# Patient Record
Sex: Female | Born: 1960
Health system: Southern US, Community
[De-identification: ages and names within clinical notes are randomized; demographics above are authoritative.]

## PROBLEM LIST (undated history)

## (undated) DIAGNOSIS — F319 Bipolar disorder, unspecified: Secondary | ICD-10-CM

## (undated) DIAGNOSIS — I10 Essential (primary) hypertension: Secondary | ICD-10-CM

## (undated) DIAGNOSIS — J45909 Unspecified asthma, uncomplicated: Secondary | ICD-10-CM

## (undated) DIAGNOSIS — N200 Calculus of kidney: Secondary | ICD-10-CM

## (undated) HISTORY — PX: CHOLECYSTECTOMY: SHX55

## (undated) HISTORY — PX: APPENDECTOMY: SHX54

## (undated) HISTORY — PX: ABDOMINAL HYSTERECTOMY: SHX81

---

## 1999-04-29 ENCOUNTER — Emergency Department (HOSPITAL_COMMUNITY): Admission: EM | Admit: 1999-04-29 | Discharge: 1999-04-29 | Payer: Self-pay | Admitting: Emergency Medicine

## 1999-04-29 ENCOUNTER — Encounter: Payer: Self-pay | Admitting: Emergency Medicine

## 1999-05-26 ENCOUNTER — Emergency Department (HOSPITAL_COMMUNITY): Admission: EM | Admit: 1999-05-26 | Discharge: 1999-05-26 | Payer: Self-pay | Admitting: *Deleted

## 1999-07-19 ENCOUNTER — Emergency Department (HOSPITAL_COMMUNITY): Admission: EM | Admit: 1999-07-19 | Discharge: 1999-07-19 | Payer: Self-pay | Admitting: Emergency Medicine

## 1999-07-19 ENCOUNTER — Encounter: Payer: Self-pay | Admitting: Emergency Medicine

## 1999-09-26 ENCOUNTER — Emergency Department (HOSPITAL_COMMUNITY): Admission: EM | Admit: 1999-09-26 | Discharge: 1999-09-26 | Payer: Self-pay | Admitting: Emergency Medicine

## 1999-11-16 ENCOUNTER — Encounter: Payer: Self-pay | Admitting: Emergency Medicine

## 1999-11-16 ENCOUNTER — Emergency Department (HOSPITAL_COMMUNITY): Admission: EM | Admit: 1999-11-16 | Discharge: 1999-11-16 | Payer: Self-pay | Admitting: Emergency Medicine

## 2000-03-08 ENCOUNTER — Emergency Department (HOSPITAL_COMMUNITY): Admission: EM | Admit: 2000-03-08 | Discharge: 2000-03-08 | Payer: Self-pay | Admitting: Emergency Medicine

## 2000-03-14 ENCOUNTER — Encounter: Admission: RE | Admit: 2000-03-14 | Discharge: 2000-03-14 | Payer: Self-pay | Admitting: Internal Medicine

## 2000-03-14 ENCOUNTER — Ambulatory Visit (HOSPITAL_COMMUNITY): Admission: RE | Admit: 2000-03-14 | Discharge: 2000-03-14 | Payer: Self-pay | Admitting: Internal Medicine

## 2000-03-14 ENCOUNTER — Encounter: Payer: Self-pay | Admitting: Internal Medicine

## 2000-04-04 ENCOUNTER — Encounter: Admission: RE | Admit: 2000-04-04 | Discharge: 2000-04-04 | Payer: Self-pay | Admitting: Internal Medicine

## 2000-05-17 ENCOUNTER — Encounter: Payer: Self-pay | Admitting: Emergency Medicine

## 2000-05-17 ENCOUNTER — Emergency Department (HOSPITAL_COMMUNITY): Admission: EM | Admit: 2000-05-17 | Discharge: 2000-05-17 | Payer: Self-pay | Admitting: Emergency Medicine

## 2000-07-22 ENCOUNTER — Emergency Department (HOSPITAL_COMMUNITY): Admission: EM | Admit: 2000-07-22 | Discharge: 2000-07-22 | Payer: Self-pay | Admitting: Emergency Medicine

## 2000-07-23 ENCOUNTER — Encounter: Payer: Self-pay | Admitting: Emergency Medicine

## 2000-10-17 ENCOUNTER — Emergency Department (HOSPITAL_COMMUNITY): Admission: EM | Admit: 2000-10-17 | Discharge: 2000-10-17 | Payer: Self-pay | Admitting: Emergency Medicine

## 2001-03-26 ENCOUNTER — Emergency Department (HOSPITAL_COMMUNITY): Admission: EM | Admit: 2001-03-26 | Discharge: 2001-03-26 | Payer: Self-pay | Admitting: Emergency Medicine

## 2001-03-28 ENCOUNTER — Emergency Department (HOSPITAL_COMMUNITY): Admission: EM | Admit: 2001-03-28 | Discharge: 2001-03-28 | Payer: Self-pay | Admitting: Emergency Medicine

## 2001-08-16 ENCOUNTER — Encounter: Payer: Self-pay | Admitting: Emergency Medicine

## 2001-08-16 ENCOUNTER — Emergency Department (HOSPITAL_COMMUNITY): Admission: EM | Admit: 2001-08-16 | Discharge: 2001-08-16 | Payer: Self-pay | Admitting: Emergency Medicine

## 2001-09-07 ENCOUNTER — Emergency Department (HOSPITAL_COMMUNITY): Admission: EM | Admit: 2001-09-07 | Discharge: 2001-09-07 | Payer: Self-pay | Admitting: Emergency Medicine

## 2001-10-16 ENCOUNTER — Emergency Department (HOSPITAL_COMMUNITY): Admission: EM | Admit: 2001-10-16 | Discharge: 2001-10-16 | Payer: Self-pay

## 2001-11-05 ENCOUNTER — Emergency Department (HOSPITAL_COMMUNITY): Admission: EM | Admit: 2001-11-05 | Discharge: 2001-11-05 | Payer: Self-pay | Admitting: Emergency Medicine

## 2001-11-06 ENCOUNTER — Emergency Department (HOSPITAL_COMMUNITY): Admission: EM | Admit: 2001-11-06 | Discharge: 2001-11-06 | Payer: Self-pay | Admitting: Emergency Medicine

## 2001-11-07 ENCOUNTER — Ambulatory Visit (HOSPITAL_COMMUNITY): Admission: RE | Admit: 2001-11-07 | Discharge: 2001-11-07 | Payer: Self-pay | Admitting: *Deleted

## 2001-11-16 ENCOUNTER — Encounter: Admission: RE | Admit: 2001-11-16 | Discharge: 2001-11-16 | Payer: Self-pay | Admitting: Obstetrics and Gynecology

## 2001-11-30 ENCOUNTER — Encounter: Admission: RE | Admit: 2001-11-30 | Discharge: 2001-11-30 | Payer: Self-pay | Admitting: *Deleted

## 2001-12-21 ENCOUNTER — Encounter: Admission: RE | Admit: 2001-12-21 | Discharge: 2001-12-21 | Payer: Self-pay | Admitting: Obstetrics and Gynecology

## 2001-12-26 ENCOUNTER — Inpatient Hospital Stay (HOSPITAL_COMMUNITY): Admission: RE | Admit: 2001-12-26 | Discharge: 2001-12-29 | Payer: Self-pay | Admitting: Obstetrics and Gynecology

## 2001-12-26 ENCOUNTER — Encounter (INDEPENDENT_AMBULATORY_CARE_PROVIDER_SITE_OTHER): Payer: Self-pay | Admitting: Specialist

## 2002-01-05 ENCOUNTER — Encounter: Payer: Self-pay | Admitting: Obstetrics and Gynecology

## 2002-01-05 ENCOUNTER — Inpatient Hospital Stay (HOSPITAL_COMMUNITY): Admission: AD | Admit: 2002-01-05 | Discharge: 2002-01-05 | Payer: Self-pay | Admitting: Obstetrics and Gynecology

## 2002-01-18 ENCOUNTER — Encounter: Admission: RE | Admit: 2002-01-18 | Discharge: 2002-01-18 | Payer: Self-pay | Admitting: Obstetrics and Gynecology

## 2002-01-21 ENCOUNTER — Emergency Department (HOSPITAL_COMMUNITY): Admission: EM | Admit: 2002-01-21 | Discharge: 2002-01-21 | Payer: Self-pay | Admitting: Emergency Medicine

## 2002-02-20 ENCOUNTER — Encounter: Admission: RE | Admit: 2002-02-20 | Discharge: 2002-02-20 | Payer: Self-pay | Admitting: *Deleted

## 2002-03-01 ENCOUNTER — Encounter: Admission: RE | Admit: 2002-03-01 | Discharge: 2002-03-01 | Payer: Self-pay | Admitting: Internal Medicine

## 2002-06-29 ENCOUNTER — Encounter: Payer: Self-pay | Admitting: Emergency Medicine

## 2002-06-29 ENCOUNTER — Emergency Department (HOSPITAL_COMMUNITY): Admission: EM | Admit: 2002-06-29 | Discharge: 2002-06-29 | Payer: Self-pay | Admitting: Emergency Medicine

## 2002-12-27 ENCOUNTER — Encounter: Admission: RE | Admit: 2002-12-27 | Discharge: 2002-12-27 | Payer: Self-pay | Admitting: Obstetrics and Gynecology

## 2003-01-01 ENCOUNTER — Encounter: Payer: Self-pay | Admitting: Obstetrics and Gynecology

## 2003-01-01 ENCOUNTER — Ambulatory Visit (HOSPITAL_COMMUNITY): Admission: RE | Admit: 2003-01-01 | Discharge: 2003-01-01 | Payer: Self-pay | Admitting: Obstetrics and Gynecology

## 2003-01-07 ENCOUNTER — Emergency Department (HOSPITAL_COMMUNITY): Admission: EM | Admit: 2003-01-07 | Discharge: 2003-01-07 | Payer: Self-pay | Admitting: *Deleted

## 2003-02-21 ENCOUNTER — Encounter: Payer: Self-pay | Admitting: Emergency Medicine

## 2003-02-21 ENCOUNTER — Emergency Department (HOSPITAL_COMMUNITY): Admission: EM | Admit: 2003-02-21 | Discharge: 2003-02-21 | Payer: Self-pay | Admitting: Emergency Medicine

## 2003-07-08 ENCOUNTER — Inpatient Hospital Stay (HOSPITAL_COMMUNITY): Admission: AD | Admit: 2003-07-08 | Discharge: 2003-07-08 | Payer: Self-pay | Admitting: Obstetrics & Gynecology

## 2003-07-11 ENCOUNTER — Emergency Department (HOSPITAL_COMMUNITY): Admission: EM | Admit: 2003-07-11 | Discharge: 2003-07-11 | Payer: Self-pay | Admitting: Emergency Medicine

## 2003-07-18 ENCOUNTER — Encounter: Admission: RE | Admit: 2003-07-18 | Discharge: 2003-07-18 | Payer: Self-pay | Admitting: Obstetrics and Gynecology

## 2003-07-29 ENCOUNTER — Ambulatory Visit (HOSPITAL_COMMUNITY): Admission: RE | Admit: 2003-07-29 | Discharge: 2003-07-29 | Payer: Self-pay | Admitting: Obstetrics and Gynecology

## 2003-10-18 ENCOUNTER — Emergency Department (HOSPITAL_COMMUNITY): Admission: EM | Admit: 2003-10-18 | Discharge: 2003-10-18 | Payer: Self-pay | Admitting: Emergency Medicine

## 2004-01-18 ENCOUNTER — Emergency Department (HOSPITAL_COMMUNITY): Admission: EM | Admit: 2004-01-18 | Discharge: 2004-01-18 | Payer: Self-pay | Admitting: Emergency Medicine

## 2004-06-17 ENCOUNTER — Emergency Department (HOSPITAL_COMMUNITY): Admission: EM | Admit: 2004-06-17 | Discharge: 2004-06-17 | Payer: Self-pay | Admitting: Emergency Medicine

## 2004-06-25 ENCOUNTER — Emergency Department (HOSPITAL_COMMUNITY): Admission: EM | Admit: 2004-06-25 | Discharge: 2004-06-25 | Payer: Self-pay | Admitting: Emergency Medicine

## 2004-06-27 ENCOUNTER — Emergency Department (HOSPITAL_COMMUNITY): Admission: EM | Admit: 2004-06-27 | Discharge: 2004-06-27 | Payer: Self-pay | Admitting: Emergency Medicine

## 2005-02-27 ENCOUNTER — Emergency Department (HOSPITAL_COMMUNITY): Admission: EM | Admit: 2005-02-27 | Discharge: 2005-02-27 | Payer: Self-pay | Admitting: Emergency Medicine

## 2005-03-09 ENCOUNTER — Emergency Department (HOSPITAL_COMMUNITY): Admission: EM | Admit: 2005-03-09 | Discharge: 2005-03-09 | Payer: Self-pay | Admitting: Emergency Medicine

## 2005-09-20 ENCOUNTER — Emergency Department (HOSPITAL_COMMUNITY): Admission: EM | Admit: 2005-09-20 | Discharge: 2005-09-20 | Payer: Self-pay | Admitting: Emergency Medicine

## 2006-01-03 ENCOUNTER — Emergency Department (HOSPITAL_COMMUNITY): Admission: EM | Admit: 2006-01-03 | Discharge: 2006-01-03 | Payer: Self-pay | Admitting: *Deleted

## 2006-06-17 ENCOUNTER — Emergency Department (HOSPITAL_COMMUNITY): Admission: EM | Admit: 2006-06-17 | Discharge: 2006-06-17 | Payer: Self-pay | Admitting: Emergency Medicine

## 2006-07-19 ENCOUNTER — Emergency Department (HOSPITAL_COMMUNITY): Admission: EM | Admit: 2006-07-19 | Discharge: 2006-07-19 | Payer: Self-pay | Admitting: Family Medicine

## 2007-07-02 ENCOUNTER — Emergency Department (HOSPITAL_COMMUNITY): Admission: EM | Admit: 2007-07-02 | Discharge: 2007-07-02 | Payer: Self-pay | Admitting: Emergency Medicine

## 2007-07-11 ENCOUNTER — Emergency Department (HOSPITAL_COMMUNITY): Admission: EM | Admit: 2007-07-11 | Discharge: 2007-07-11 | Payer: Self-pay | Admitting: Emergency Medicine

## 2007-07-26 IMAGING — CR DG CHEST 2V
2 series · 2 of 2 positions shown · non-contrast
Comparison: Reported 06/29/2002.

CLINICAL DATA: Cough, shortness of breath and chest pain.

CHEST - 2 VIEW

[w chest pa]
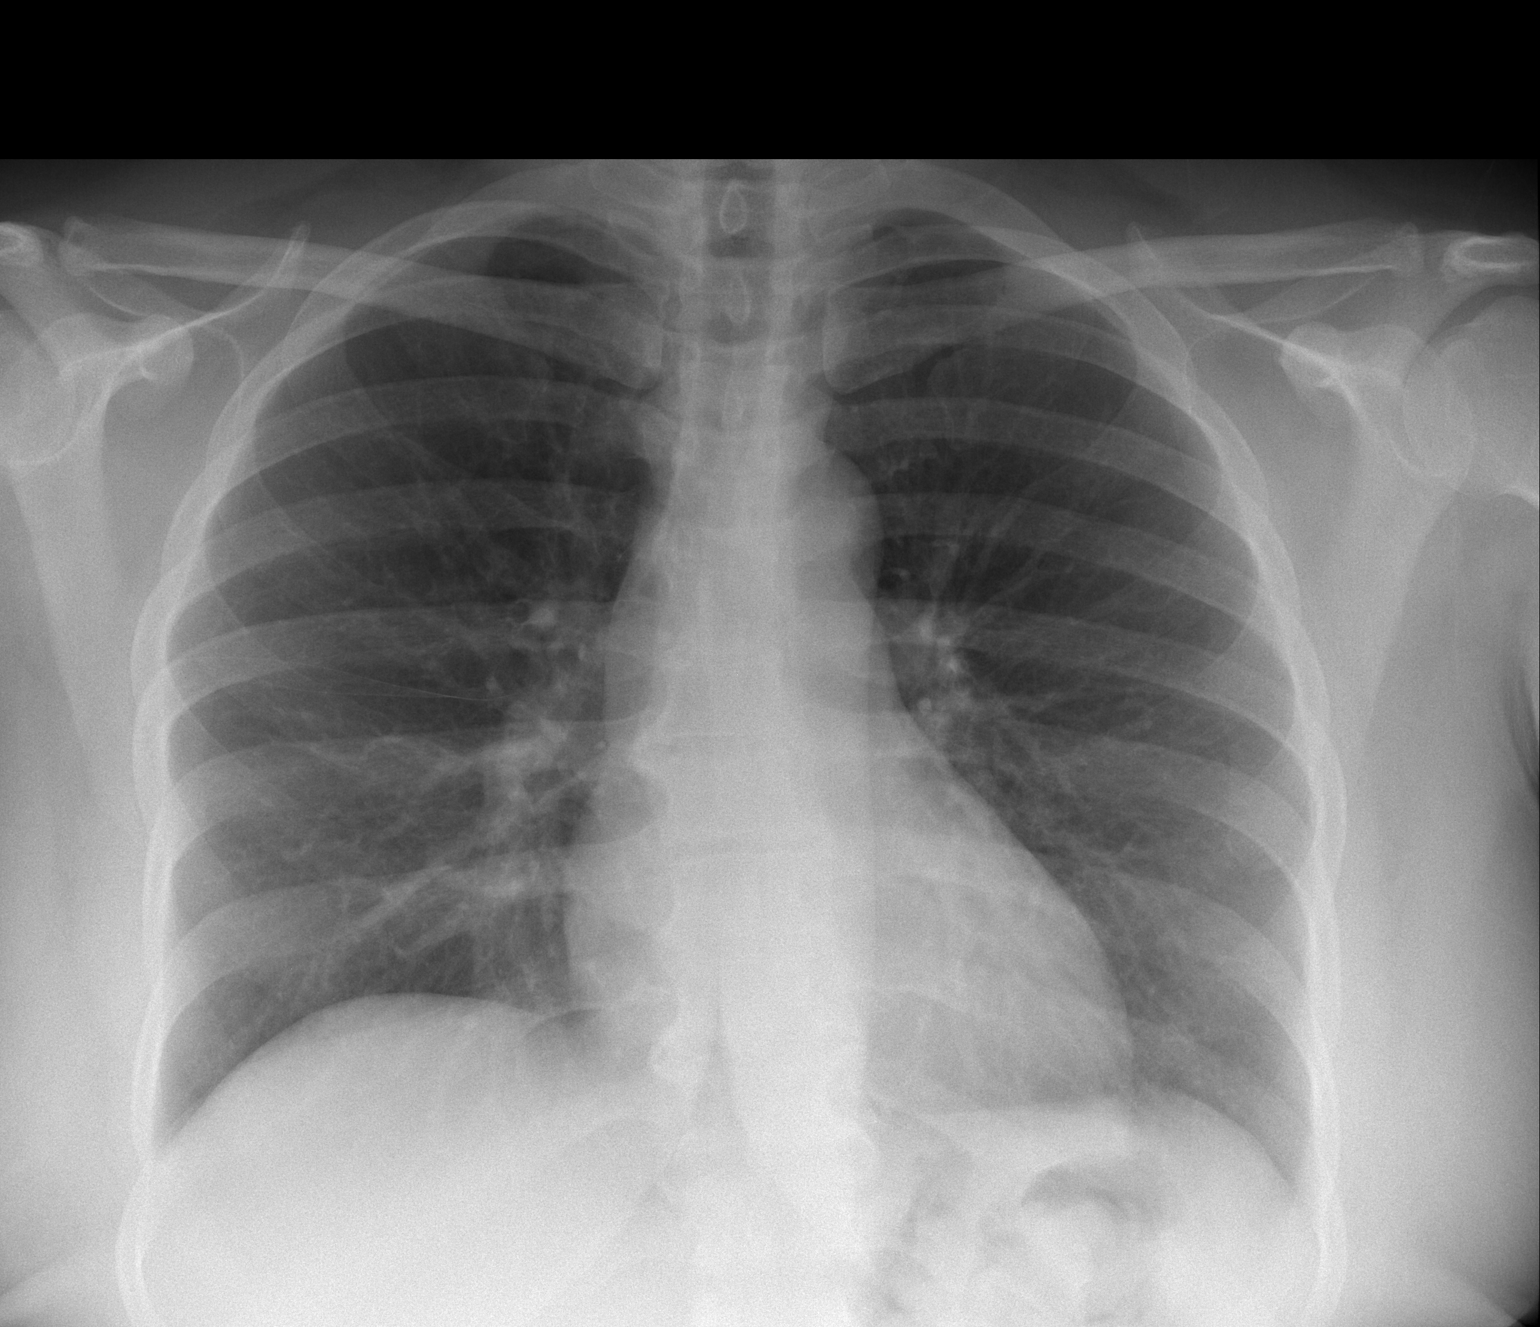

[w chest lat]
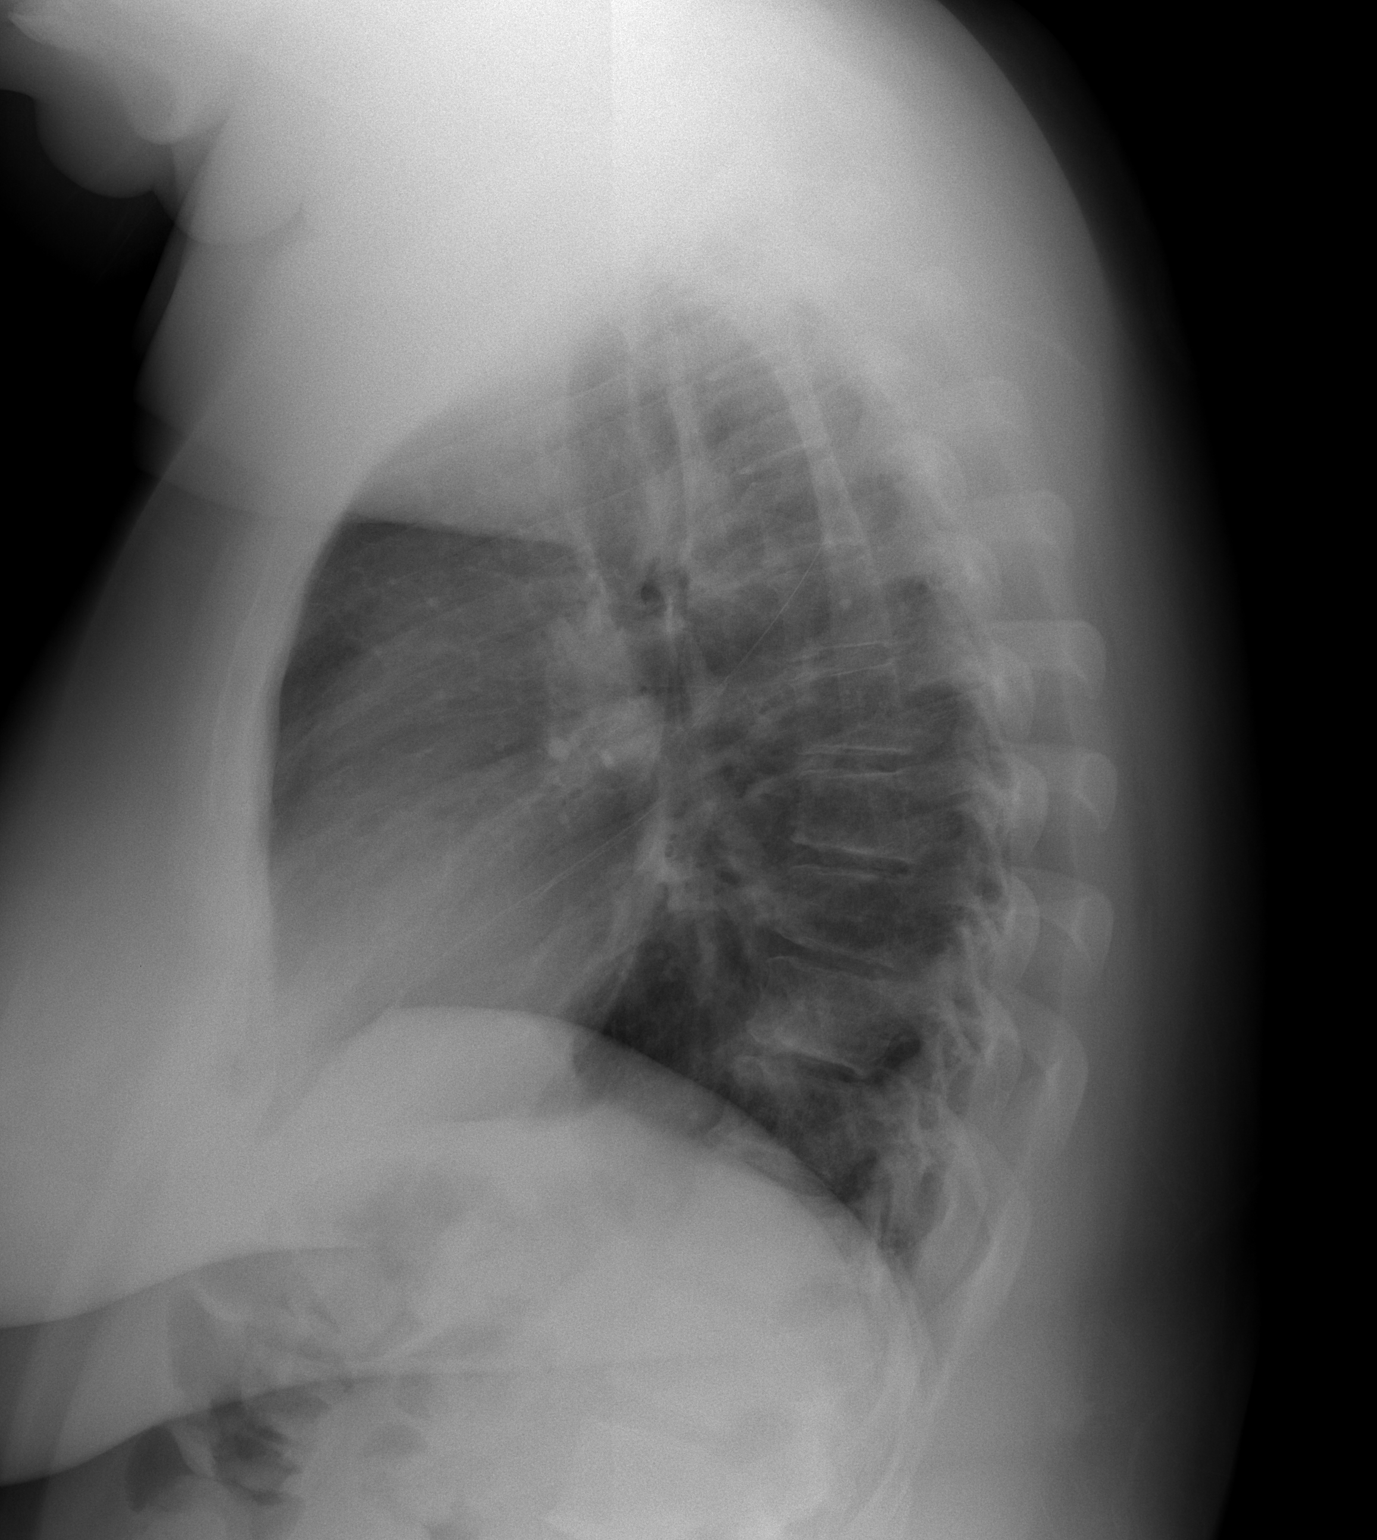

[2 of 2 positions shown; findings below may reference images not displayed]

FINDINGS: Normal sized heart. Diffuse peribronchial thickening. Thoracic spine
degenerative changes and mild scoliosis.

IMPRESSION

Mild to moderate bronchitic changes.

## 2008-05-09 ENCOUNTER — Emergency Department (HOSPITAL_COMMUNITY): Admission: EM | Admit: 2008-05-09 | Discharge: 2008-05-09 | Payer: Self-pay | Admitting: Emergency Medicine

## 2008-05-20 ENCOUNTER — Emergency Department (HOSPITAL_COMMUNITY): Admission: EM | Admit: 2008-05-20 | Discharge: 2008-05-20 | Payer: Self-pay | Admitting: Emergency Medicine

## 2008-05-27 ENCOUNTER — Emergency Department (HOSPITAL_COMMUNITY): Admission: EM | Admit: 2008-05-27 | Discharge: 2008-05-27 | Payer: Self-pay | Admitting: Emergency Medicine

## 2008-06-10 ENCOUNTER — Emergency Department (HOSPITAL_COMMUNITY): Admission: EM | Admit: 2008-06-10 | Discharge: 2008-06-10 | Payer: Self-pay | Admitting: Emergency Medicine

## 2008-09-23 ENCOUNTER — Emergency Department (HOSPITAL_COMMUNITY): Admission: EM | Admit: 2008-09-23 | Discharge: 2008-09-23 | Payer: Self-pay | Admitting: Emergency Medicine

## 2010-09-21 LAB — DIFFERENTIAL
Basophils Absolute: 0 10*3/uL (ref 0.0–0.1)
Basophils Relative: 0 % (ref 0–1)
Eosinophils Absolute: 0.2 10*3/uL (ref 0.0–0.7)
Eosinophils Relative: 2 % (ref 0–5)
Lymphocytes Relative: 34 % (ref 12–46)
Lymphs Abs: 3.1 10*3/uL (ref 0.7–4.0)
Monocytes Absolute: 0.6 10*3/uL (ref 0.1–1.0)
Monocytes Relative: 6 % (ref 3–12)
Neutro Abs: 5.2 10*3/uL (ref 1.7–7.7)
Neutrophils Relative %: 57 % (ref 43–77)

## 2010-09-21 LAB — POCT I-STAT, CHEM 8
BUN: 8 mg/dL (ref 6–23)
Calcium, Ion: 1.14 mmol/L (ref 1.12–1.32)
Chloride: 101 mEq/L (ref 96–112)
Creatinine, Ser: 0.8 mg/dL (ref 0.4–1.2)
Glucose, Bld: 115 mg/dL — ABNORMAL HIGH (ref 70–99)
HCT: 42 % (ref 36.0–46.0)
Hemoglobin: 14.3 g/dL (ref 12.0–15.0)
Potassium: 3.1 mEq/L — ABNORMAL LOW (ref 3.5–5.1)
Sodium: 140 mEq/L (ref 135–145)
TCO2: 28 mmol/L (ref 0–100)

## 2010-09-21 LAB — D-DIMER, QUANTITATIVE: D-Dimer, Quant: 0.31 ug/mL-FEU (ref 0.00–0.48)

## 2010-09-21 LAB — CBC
HCT: 38.4 % (ref 36.0–46.0)
Hemoglobin: 12.7 g/dL (ref 12.0–15.0)
MCHC: 33 g/dL (ref 30.0–36.0)
MCV: 80.4 fL (ref 78.0–100.0)
Platelets: 348 10*3/uL (ref 150–400)
RBC: 4.77 MIL/uL (ref 3.87–5.11)
RDW: 13.5 % (ref 11.5–15.5)
WBC: 9.1 10*3/uL (ref 4.0–10.5)

## 2010-09-21 LAB — POCT CARDIAC MARKERS
CKMB, poc: <1 ng/mL — ABNORMAL LOW (ref 1.0–8.0)
Myoglobin, poc: 66.5 ng/mL (ref 12–200)
Troponin i, poc: <0.05 ng/mL (ref 0.00–0.09)

## 2010-10-23 NOTE — Consult Note (Signed)
[place]. [facility]  Patient:    Emily Cochran, Emily Cochran Visit Number: [id number] MRN: [id number]          Service Type: EMS Location: ED Attending Physician:  Benny Lennert Dictated by:   Kristen Loader, M.D. Proc. Date: 11/30/01 Admit Date:  11/06/2001 Discharge Date: 11/06/2001                            Consultation Report  HISTORY OF PRESENT ILLNESS:  The patient is a 50 year old multiparous African-American female who has had several cesarean sections and has pelvic pain, she states since tubal ligation several years ago.  The pain is in the mid lower abdominal area and it radiates to the lower back and down into her leg.  She was recently treated for Trichomonas and bacterial vaginosis with Flagyl and Metrojel.  Accompanying her pain is extremely heavy and irregular periods where she bleeds for extended times for two weeks at a time.  An ultrasound examination revealed a fibroid uterus where the endometrial stripe could not be identified.  The largest fibroids were 9 cm in diameter and 5.9 cm in diameter.  We discussed surgery with the patient.  She prefers myomectomy. We discussed that sometimes this was impossible and if we did do it, there might be a very good chance she may need multiple blood transfusions.  I also discussed total abdominal hysterectomy.  The patient understands that by doing a myomectomy, this might not relieve the majority of her pain and may not effect a lot of the bleeding.  Since the fibroids appear to be in the wall of the uterus and even if we take out the uterus, we certainly could not guarantee that this would relieve all of the pain, but it certainly would relieve the bleeding.  The patient is going to consider this and discuss it with her family.  Her friend with her was present during this discussion.  The patient says that the pain is severe enough that she is unable to stand on her feet or hold a job.  The patient at  the time of this examination was bleeding, so we did a repeat culture, but wet prep was not possible.  The diagnosis at this time is symptomatic leiomyomata uteri.  The patient is to consider the procedures and schedule either myomectomy with possible hysterectomy or total abdominal hysterectomy. Dictated by:   Kristen Loader, M.D. Attending Physician:  Benny Lennert DD:  11/30/01 TD:  12/02/01 Job: [id number]

## 2010-10-23 NOTE — Consult Note (Signed)
[place]. [facility]  Patient:    Emily Cochran, Emily Cochran Visit Number: [id number] MRN: [id number]          Service Type: EMS Location: ED Attending Physician:  Benny Lennert Dictated by:   Kristen Loader, M.D. Proc. Date: 11/16/01 Admit Date:  11/06/2001 Discharge Date: 11/06/2001                            Consultation Report  CHIEF COMPLAINT:  Pelvic pain radiating to right leg.  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  She was seen in a clinic where they did a sonogram that showed very large fibroid tumors, the largest on the right measuring 9 cm in nature and dimension.  The patient also was treated at that time with Flagyl and Doxycycline for pelvic infection and vaginitis.  She comes to the clinic today for the first time where her symptoms are persisting.  At this point, she has severe genital itching.  PHYSICAL EXAMINATION:  ABDOMEN:  Soft, flat, tender suprapubically, especially on the right side with no guarding and no rebound.  There was a large lower abdominal scar because of three cesarean sections and a tubal ligation that she had in the past.  GENITAL:  Revealed large, swollen, edematous white-coated labia majoris and minoris.  Tender to touch with a heavy cottage chesse vaginal discharge extending through the entire vagina through the cervix.  By manual pelvic exam, we were able to blot a large mass in the lower abdomen extending on the right side just a fingerbreadth below the umbilicus.  LABORATORY DATA:  A wet prep was done which revealed 4+ yeast.  IMPRESSION: 1. Symptomatic fibroids. 2. Severe yeast. 3. Vulvovaginitis. 4. Blood loss anemia as the patient has been complaining of menorrhagia    for  many years.  DISPOSITION:  The patient was placed on Diflucan and Mycolog too.  She will be scheduled for a hysterectomy.  We discussed the procedure with the patient.  I want her to return to the clinic in two weeks for reevaluation.  Incidentally, during exam, we noticed a large fibroid which palpated very large on both sides with no nodules noted.  Laboratories obtained was a CBC, T4, T3, and a TSH. Dictated by:   Kristen Loader, M.D. Attending Physician:  Benny Lennert DD:  11/16/01 TD:  11/18/01 Job: [id number]

## 2010-10-23 NOTE — Discharge Summary (Signed)
   NAME:  Emily Cochran, Emily Cochran                        ACCOUNT NO.:  [id number]   MEDICAL RECORD NO.:  [id number]                   PATIENT TYPE:  INP   LOCATION:  9308                                 [facility]ILITY:  [facility]   PHYSICIAN:  Phil D. Okey Dupre, M.D.                  DATE OF BIRTH:  November 21, 1960   DATE OF ADMISSION:  12/26/2001  DATE OF DISCHARGE:  12/29/2001                                 DISCHARGE SUMMARY   The patient is a 50 year old black female who on the day of admission  underwent supracervical abdominal hysterectomy and partial left  salpingectomy with preoperatively and postoperative diagnoses of pelvic  pain, chronic fibroids, menorrhagia, and anemia with pelvic adhesions.  The  patient did extremely well and had an afebrile course postoperatively.  Again, staples were removed prior to discharge.  Discharge instructions have  been given to the patient as to activity, diet, and follow-up.  She will be  seen in the clinic two weeks from discharge.  The patient has been told to  start on iron once regular bowel movements have been reestablished.  We will  follow that up in her visit with the hopes that her hemoglobin will be back  to normal.  The patient's preoperatively hemoglobin and hematocrit were 8.6  and 28.6, respectively, and at discharge were 7.0 and 22.4, respectively.  Other labs were within normal limits.   DISCHARGE DIAGNOSIS:  Status post subtotal abdominal hysterectomy for  menorrhagia, symptomatic fibroids, and severe anemia.                                               Phil D. Okey Dupre, M.D.    PDR/MEDQ  D:  12/29/2001  T:  01/07/2002  Job:  [id number]

## 2010-10-23 NOTE — Consult Note (Signed)
NAME:  Emily Cochran, Emily Cochran                        ACCOUNT NO.:  [id number]   MEDICAL RECORD NO.:  [id number]                   PATIENT TYPE:  EMS   LOCATION:  ED                                   FACILITY:  [facility]   PHYSICIAN:  Sandria Bales. Ezzard Standing, M.D.               DATE OF BIRTH:  December 21, 1960   DATE OF CONSULTATION:  02/21/2003  DATE OF DISCHARGE:                                   CONSULTATION   HISTORY OF PRESENT ILLNESS:  Ms. Sitts is a 50 year old black female who  has no primary medical doctor, had a partial hysterectomy by Javier Glazier. Rose,  M.D. for fibroid tumors of her uterus as her only identifying medical  doctor.  She, about six or eight weeks ago, presented to [facility]  with abdominal pain and ultrasound revealed a gallstone or gallstones.  She  saw somebody in the [place] office though she is not really sure who or what  department she was seen in.  Now she has had about a three-week history of  vague abdominal pain and re-presented to the [facility] Emergency Room last  night.  It is unclear to me why she waited three weeks to come to the  emergency room.  I could not see that she had any exacerbation of her pain.  She has not had any fever.   She has a remote history of ulcer disease about 1990.  She has no recent  problem with ulcer problems.  She has no history of liver disease,  pancreatic disease, colon disease, or change in bowel habits.   Her prior abdominal surgery has included she had three children who are 72,  25, and 22 all delivered by C-section.  She had the hysterectomy about six  to eight months ago and then she has had an appendectomy some 15 or 20 years  ago.  She also describes that she has had a vaginal discharge.  She has on  and off.  It is odorous at this time.   PAST MEDICAL HISTORY:  She has no allergies.  She is on no medications.   REVIEW OF SYSTEMS:  NEUROLOGICAL:  No known seizure or loss of  consciousness.  PULMONARY:  No history of  pneumonia or tuberculosis.  CARDIAC:  No history of heart disease, chest pain, or hypertension.  GASTROINTESTINAL:  See History of Present Illness.  UROLOGIC:  No history of  kidney stones or kidney infections.  GYN:  Again, she has had three children  delivered by C-section.  Again, she had the hysterectomy, had a recent  vaginal discharge.   She works as a Engineer, drilling.  Again, lives with her son.   PHYSICAL EXAMINATION:  VITAL SIGNS:  Temperature 98.1, pulse 59,  respirations 18, blood pressure 103/67.  GENERAL:  She is a well-nourished, moderately obese black female alert and  cooperative on physical exam.  HEENT:  Unremarkable.  NECK:  Supple.  I felt no mass or thyromegaly, no lymphadenopathy.  LUNGS:  Clear to auscultation with symmetric breath sounds.  HEART:  Regular rate and rhythm without murmur or rub.  BREASTS:  I did not examine her breasts.  She has not had a mammogram and I  told her that at age 7 she probably should start having mammograms at least  on an every two year basis.  ABDOMEN:  She actually has had some soreness in the right lower quadrant.  I  do not feel any right upper quadrant mass or lesion.  She has a well-healed  lower midline incision from her prior C-section.  She also has an  Pfannenstiel incision.  PELVIC:  I did not do a pelvic exam because this was done by the ER  physicians.  I have spoke with Dr. Donnetta Hutching regarding this and regarding  her vaginal discharge.  EXTREMITIES:  She has good strength in upper and lower extremities.  NEUROLOGICAL:  She is grossly intact.   LABORATORY DATA:  Labs that I have show a normal urinalysis.  Her white  blood count is 10,100, hemoglobin 11.1, hematocrit 33.5.  Her potassium is  3.5, sodium 137, chloride 105.  Her creatinine is 0.7.  Liver function is  within normal limits with an alkaline phosphatase of 77, bilirubin of 0.4.  Her amylase is 109.  Her lipase is 40.   IMPRESSION:  1. Right lower  quadrant pain with vaginal discharge, question of whether     this actually is more of a gynecologic female acute problem.  I again     discussed this with Dr. Donnetta Hutching who will review either her pelvic exam     and labs, taken from that and then treat her accordingly.  2. I did review her ultrasound.  She does have a solitary gallstone in the     neck of her gallbladder though she has no stigmata of acute gallbladder     disease either on ultrasound or physical exam or by labs.  I gave her my     name.  She is to come by our office to arrange and schedule an elective     cholecystectomy.  She is to take a book on cholecystectomy.  I discussed     a little bit about the operation including laparoscopic and open surgery     and the risk of bleeding, infection of open surgery.  3. She needs a primary medical doctor.  I went over this with her that she     is at the age where she really needs routine evaluations such as     mammograms, etc., for evaluation.                                               Sandria Bales. Ezzard Standing, M.D.    DHN/MEDQ  D:  02/21/2003  T:  02/21/2003  Job:  [id number]   cc:   Michele Mcalpine D. Okey Dupre, M.D.  Fax: [phone]

## 2010-10-23 NOTE — Op Note (Signed)
[facility]  Patient:    Emily Cochran, Emily Cochran Visit Number: [id number] MRN: [id number]          Service Type: GYN Location: 9300 [id number] Attending Physician:  Amada Kingfisher. Dictated by:   Elinor Dodge, M.D. Proc. Date: 12/26/01 Admit Date:  12/26/2001 Discharge Date: 12/29/2001                             Operative Report  PREOPERATIVE DIAGNOSES:       1. Chronic pelvic pain.                               2. Fibroids.                               3. Menorrhagia.                               4. Anemia.  POSTOPERATIVE DIAGNOSES:      1. Chronic pelvic pain.                               2. Fibroids.                               3. Menorrhagia.                               4. Anemia.  OPERATIONS:                   1. Supracervical abdominal hysterectomy.                               2. Partial left salpingectomy.  SURGEON:                      Elinor Dodge, M.D.  FIRST ASSISTANT:              Georgina Peer, M.D.  OPERATIVE FINDINGS:           Multiple leiomyomata uteri with a uterus of about [redacted] weeks gestational size, a large submucous leiomyomata measuring 5 cm in diameter in the left broad ligament, and a large fundal fibroid measuring approximately 7 cm in diameter. There were multiple pelvic adhesions, residual of previous tubal ligation, and a bladder flap that was high up on the anterior surface of the uterus where the fibroid began. Upper abdominal viscera was normal.  DESCRIPTION OF PROCEDURE:     Under satisfactory general anesthesia, with a Foley catheter in the bladder and the vagina having been prepped, the abdomen was prepped and draped in the usual sterile manner and entered through a transverse incision removing an old surgical scar on entry because of the abdominal deformity. The fascia was opened by sharp dissection. The rectus muscles were then transected by sharp dissection and the peritoneal cavity was entered. The  bowel was well packed away. A Balfour retractor placed in the pelvis and the findings were as aforementioned. Straight Kocher clamps were placed, mapped parallel to the uterus and just adjacent  to it to include the round ligament, the utero-ovarian ligament, and the meso beneath that. The round ligaments were then ligated, divided, and the anterior leaf of the broad ligament opened as much as possible, especially on the right side. The left side was inhibited because of the large fibroid and the broad ligament. This was run around the anterior superior portion of the cervix. An avascular portion of the broad ligament was then opened and a free-tie placed around that and around the utero-ovarian pedicle. A straight Kocher clamp placed medial to that and pedicle on both sides and its tissue medially divided. Lateral pedicle ligated with 1 chromic catgut suture ligature. The bladder was pushed away from the cervix on the right side. It was still not freed up on the left and the uterine vessels were skeletonized, doubly clamped, divided, and ligated with 1 chromic catgut suture ligature and the cardinal ligament was grasped on that side, divided, and ligated with 1 chromic catgut suture ligature. This gave some freedom to pull the uterus over to the right side and allowed [place] to dissect out most of the fibroid from the broad ligament. Once we got below that, we were able to free up the peritoneum and the front of the uterus and push the bladder away from the lower uterine segment, which allowed [place] to skeletonize the uterine vessels on that side, clamp it, divide it, and ligate it with 1 chromic catgut suture ligature and then dissect the corpus of the uterus away from the cervix by sharp dissection with very little bleeding. This then opened up the exposure so that we could continue down with a couple of bites on the cardinal ligament, which was divided and ligated and that portion of the cervix  above that was excised thus leaving small portion of cervix down below which would have been difficult to get in this patient with three previous sections and no vaginal deliveries. The cervix was then oversewn to control oozing with figure-of-eight sutures and no bleeding was noted in the pelvis. Attention directed to the adnexa on the right. It was stable, there was no sign of bleeding. The left, however, had a necrotic-appearing portion of a fallopian tube which was excised and the pedicle ligated with 1 chromic catgut suture ligature. The remaining pedicle sewn to the round ligament on that side with one figure-of-eight suture so that it would not fall into the cul-de-sac. The large bowel placed over the pelvic suture area. The pelvis was irrigated. Tape sponge, instrument, and needle counts were reported as correct at this time and the peritoneum was closed with continuous running 3-0 Monocryl suture. The fascia was closed with continuous running alternating lock 0 Vicryl from either end of the incision meeting in the mid point. Subcutaneous bleeders controlled with hot cautery and skin staples for skin edge approximation. Dry sterile dressing was applied. The Foley catheter draining clear urine at the end of the procedure. The patient tolerated the procedure well with approximately 200 cc blood loss. Dictated by:   Elinor Dodge, M.D. Attending Physician:  Amada Kingfisher. DD:  12/26/01 TD:  12/29/01 Job: [id number]

## 2010-10-23 NOTE — Group Therapy Note (Signed)
NAME:  Emily Cochran, Emily Cochran                        ACCOUNT NO.:  [id number]   MEDICAL RECORD NO.:  [id number]                   PATIENT TYPE:  OUT   LOCATION:  [facility]                           FACILITY:  [facility]   PHYSICIAN:  Argentina Donovan, M.D.                   DATE OF BIRTH:  1960/11/06   DATE OF SERVICE:  12/27/2002                                    CLINIC NOTE   HISTORY OF PRESENT ILLNESS:  This is a 50 year old black female who had a  supracervical hysterectomy approximately one year ago.  Now states that she  comes in with a myriad of complaints from top to bottom.  She has been  complaining about headaches.  She has migraine headaches for a long time and  before her surgery.  These do not seem to be any worse but they are somewhat  frequent.  She also has the discussion of history of some mild dysphagia,  bloating, and nausea post prandial with occasional vomiting.  Her biggest  complaint is abdominal pain mostly in the upper abdomen radiating to the  back.  She also has said she has had multiple episodes of falling out.  When  discussing what type and has anyone witnessed this she said that her son did  notice her faint at one time for about a 20-minute period and his girlfriend  who is a medical tech was there and apparently they did not call the rescue  squad at that time.  She says she has been into [facility] Emergency Room on  several visits but no diagnostic tests were done.   PHYSICAL EXAMINATION:  NECK:  The thyroid seems enlarged, although  symmetrical and soft.  ABDOMEN:  Soft, flat, somewhat tender to deep palpation but there is no  guarding or rebound.  However, there is marked disruption of the normal  anatomy of the abdomen secondary to previous surgical scars.  PELVIC:  Pap smear was taken.  Cervix is normal and clean.  Vagina is clean  and well rugated.  External genitalia was normal.  Bimanual pelvic  examination:  Was unable to palpate the ovaries because of  the habitus of  the patient, although there did not seem to be any ovarian enlargement.   IMPRESSION:  Myriad of complaints, uncertain diagnosis.  I think we will  start a work-up with this patient with a metabolic panel including a thyroid  evaluation, glucose evaluation, and upper gastrointestinal series.  Also,  check Helicobacter pylori level and get a sonogram of the pelvis, although I  suspect that will be normal.  A 50 year old female with multiple complaints,  the most important seem to be related to post prandial dysfunction and  fainting spells.  Possibly referral to neurologist after basic work-up is  done.  Argentina Donovan, M.D.    PR/MEDQ  D:  12/27/2002  T:  12/27/2002  Job:  [id number]

## 2010-10-23 NOTE — Group Therapy Note (Signed)
NAME:  Emily Cochran, Emily Cochran                        ACCOUNT NO.:  [id number]   MEDICAL RECORD NO.:  [id number]                   PATIENT TYPE:  OUT   LOCATION:  [facility]                           FACILITY:  [facility]   PHYSICIAN:  Argentina Donovan, MD                     DATE OF BIRTH:  [birth year]   DATE OF SERVICE:  07/18/2003                                    CLINIC NOTE   REASON FOR VISIT:  The patient is a 50 year old black female who underwent a  supracervical hysterectomy for symptomatic fibroids and severe anemia in  July 2003 and is in for her repeat Pap smear.  She has recently been treated  for Helicobacter pylori.  She has had a history of gastrointestinal reflux  as well as cholelithiasis, untreated, and has a main complaint of a white  vaginal discharge as well as umbilical pain and supraumbilical pain that  goes around her entire waist and into her back and sometimes has episodes of  complete falling out - as she puts it - where she falls on the floor.  She  had been seen several times in emergency rooms and no complete workup has  been done.  She was referred to gastroenterologist who she says she saw once  but never went back.  We tell her that her problems are not gynecological  and that she should be seen by gastroenterology and by internal medicine for  a full medical workup.  Her blood pressure on exam is 126/85.  She is 211  pounds, 5 feet 2 inches, with a 78 and regular pulse.  Abdomen is soft,  flat, nontender to palpation.  No masses, no organomegaly, but marked  scarring in the lower abdomen.  External genitalia was normal.  The vagina  is clean, well rugated, with a moderate leukorrhea and a clean, parous  cervix.  Pap smear was taken as well as a wet prep.  Bimanual pelvic  examination:  The ovaries could not be palpated because of the habitus of  the patient.  We request that she goes back to a gastroenterologist.  I am  going to get a pelvic sonogram to evaluate her  ovaries.                                               Argentina Donovan, MD    PR/MEDQ  D:  07/18/2003  T:  07/18/2003  Job:  [id number]

## 2011-02-26 LAB — DIFFERENTIAL
Basophils Absolute: 0
Basophils Relative: 0
Eosinophils Absolute: 0
Eosinophils Relative: 1
Lymphocytes Relative: 15
Lymphs Abs: 0.9
Monocytes Absolute: 0.5
Monocytes Relative: 8
Neutro Abs: 4.7
Neutrophils Relative %: 76

## 2011-02-26 LAB — COMPREHENSIVE METABOLIC PANEL
ALT: 11
AST: 15
Albumin: 3.6
Alkaline Phosphatase: 78
BUN: 3 — ABNORMAL LOW
CO2: 28
Calcium: 8.6
Chloride: 100
Creatinine, Ser: 0.75
GFR calc Af Amer: >60
GFR calc non Af Amer: >60
Glucose, Bld: 86
Potassium: 3.4 — ABNORMAL LOW
Sodium: 135
Total Bilirubin: 0.5
Total Protein: 6.8

## 2011-02-26 LAB — URINE MICROSCOPIC-ADD ON

## 2011-02-26 LAB — URINALYSIS, ROUTINE W REFLEX MICROSCOPIC
Bilirubin Urine: NEGATIVE
Glucose, UA: NEGATIVE
Ketones, ur: NEGATIVE
Nitrite: POSITIVE — AB
Protein, ur: NEGATIVE
Specific Gravity, Urine: 1.012
Urobilinogen, UA: 1
pH: 8

## 2011-02-26 LAB — CBC
HCT: 37.7
Hemoglobin: 12.7
MCHC: 33.6
MCV: 79
Platelets: 341
RBC: 4.77
RDW: 13.6
WBC: 6.1

## 2011-02-26 LAB — INFLUENZA A+B VIRUS AG-DIRECT(RAPID)
Inflenza A Ag: NEGATIVE
Influenza B Ag: NEGATIVE

## 2011-02-26 LAB — POCT PREGNANCY, URINE
Operator id: 264421
Preg Test, Ur: NEGATIVE

## 2011-02-26 LAB — RAPID STREP SCREEN (MED CTR MEBANE ONLY): Streptococcus, Group A Screen (Direct): NEGATIVE

## 2011-02-26 LAB — LIPASE, BLOOD: Lipase: 34

## 2013-11-14 ENCOUNTER — Emergency Department (HOSPITAL_COMMUNITY)
Admission: EM | Admit: 2013-11-14 | Discharge: 2013-11-14 | Disposition: A | Discharge status: Home / Self Care | Payer: Medicare Other | Attending: Emergency Medicine | Admitting: Emergency Medicine

## 2013-11-14 ENCOUNTER — Encounter (HOSPITAL_COMMUNITY): Payer: Self-pay | Admitting: Emergency Medicine

## 2013-11-14 ENCOUNTER — Emergency Department (HOSPITAL_COMMUNITY): Payer: Medicare Other

## 2013-11-14 DIAGNOSIS — Z8659 Personal history of other mental and behavioral disorders: Secondary | ICD-10-CM | POA: Diagnosis not present

## 2013-11-14 DIAGNOSIS — R5383 Other fatigue: Secondary | ICD-10-CM | POA: Insufficient documentation

## 2013-11-14 DIAGNOSIS — E119 Type 2 diabetes mellitus without complications: Secondary | ICD-10-CM | POA: Diagnosis not present

## 2013-11-14 DIAGNOSIS — M25539 Pain in unspecified wrist: Secondary | ICD-10-CM | POA: Diagnosis present

## 2013-11-14 DIAGNOSIS — R079 Chest pain, unspecified: Secondary | ICD-10-CM | POA: Insufficient documentation

## 2013-11-14 DIAGNOSIS — M25519 Pain in unspecified shoulder: Secondary | ICD-10-CM | POA: Diagnosis not present

## 2013-11-14 DIAGNOSIS — R5381 Other malaise: Secondary | ICD-10-CM | POA: Diagnosis not present

## 2013-11-14 DIAGNOSIS — M542 Cervicalgia: Secondary | ICD-10-CM | POA: Insufficient documentation

## 2013-11-14 DIAGNOSIS — M79603 Pain in arm, unspecified: Secondary | ICD-10-CM

## 2013-11-14 DIAGNOSIS — I10 Essential (primary) hypertension: Secondary | ICD-10-CM | POA: Diagnosis not present

## 2013-11-14 HISTORY — DX: Bipolar disorder, unspecified: F31.9

## 2013-11-14 HISTORY — DX: Essential (primary) hypertension: I10

## 2013-11-14 MED ORDER — MELOXICAM 7.5 MG PO TABS: 7.5000 mg | ORAL_TABLET | Freq: Every day | ORAL | Status: DC

## 2013-11-14 MED ORDER — TRAMADOL HCL 50 MG PO TABS: 50.0000 mg | ORAL_TABLET | Freq: Four times a day (QID) | ORAL | Status: DC | PRN

## 2013-11-14 NOTE — Discharge Instructions (Signed)
Please read and follow all provided instructions.  Your diagnoses today include:  1. Arm pain     Tests performed today include:  An x-ray of the affected area - does NOT show any broken bones  EKG - no concerning signs for a heart attack  Vital signs. See below for your results today.   Medications prescribed:   Meloxicam - anti-inflammatory pain medication  You have been prescribed an anti-inflammatory medication or NSAID. Take with food. Do not take aspirin, ibuprofen, or naproxen if taking this medication. Take smallest effective dose for the shortest duration needed for your pain. Stop taking if you experience stomach pain or vomiting.    Tramadol - narcotic-like pain medication  DO NOT drive or perform any activities that require you to be awake and alert because this medicine can make you drowsy.   Take any prescribed medications only as directed.  Home care instructions:   Follow any educational materials contained in this packet  Follow R.I.C.E. Protocol:  R - rest your injury   I  - use ice on injury without applying directly to skin  C - compress injury with bandage or splint  E - elevate the injury as much as possible  Follow-up instructions: Please follow-up with your primary care provider or the provided orthopedic physician (bone specialist).  If you do not have a primary care doctor -- see below for referral information.   Return instructions:   Please return to the Emergency Department if you experience worsening symptoms.   Please return if you have any other emergent concerns.  Please return with worsening chest pain, shortness of breath, lightheadedness  Additional Information:  Your vital signs today were: BP 147/87   Pulse 68   Temp(Src) 98.6 F (37 C) (Oral)   Resp 16   SpO2 100% If your blood pressure (BP) was elevated above 135/85 this visit, please have this repeated by your doctor within one month. --------------

## 2013-11-14 NOTE — ED Notes (Signed)
Pt awaiting ortho tech for splint placement prior to d/c.  

## 2013-11-14 NOTE — ED Provider Notes (Signed)
CSN: [id number]     Arrival date & time 11/14/13  [id number] History   First MD Initiated Contact with Patient 11/14/13 1005     Chief Complaint  Patient presents with  . Wrist Pain     (Consider location/radiation/quality/duration/timing/severity/associated sxs/prior Treatment) HPI Comments: Patient presents with complaint of left hand pain. Patient is right-handed. 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 9 days ago the patient began having pain and swelling in her left hand. 8 days ago patient was seen by a doctor at the request job. At that point she was started on prednisone which he states has helped the swelling. She continues to have left hand pain which now has began to radiate into her forearm, upper arm, neck, chest, into her right shoulder and into her right upper arm. This pain has been constant. It is made worse with movement. Patient states that she is unable to lift her left arm. No other treatments prior to arrival. The onset of this condition was acute. The course is constant.   Patient is a 53 y.o. female presenting with wrist pain. The history is provided by the patient.  Wrist Pain Associated symptoms include arthralgias, chest pain, myalgias, neck pain and weakness. Pertinent negatives include no joint swelling or numbness.    Past Medical History  Diagnosis Date  . Hypertension   . Diabetes mellitus without complication   . Bipolar 1 disorder    History reviewed. No pertinent past surgical history. History reviewed. No pertinent family history. History  Substance Use Topics  . Smoking status: Never Smoker   . Smokeless tobacco: Not on file  . Alcohol Use: No   OB History   No data available     Review of Systems  Constitutional: Positive for activity change.  Respiratory: Negative for shortness of breath.   Cardiovascular: Positive for chest pain.  Musculoskeletal: Positive for arthralgias, myalgias and neck pain.  Negative for back pain and joint swelling.  Skin: Negative for wound.  Neurological: Positive for weakness. Negative for numbness.      Allergies  Review of patient's allergies indicates not on file.  Home Medications   Prior to Admission medications   Not on File   BP 147/87  Pulse 68  Temp(Src) 98.6 F (37 C) (Oral)  Resp 16  SpO2 100% Physical Exam  Nursing note and vitals reviewed. Constitutional: She appears well-developed and well-nourished.  HENT:  Head: Normocephalic and atraumatic.  Eyes: Pupils are equal, round, and reactive to light.  Neck: Normal range of motion. Neck supple.  Cardiovascular: Exam reveals no decreased pulses.   Pulses:      Radial pulses are 2+ on the right side, and 2+ on the left side.  Musculoskeletal: She exhibits tenderness. She exhibits no edema.  Patient with diffuse tenderness to palpation of left hand, forearm, upper arm, neck and trapezius, right shoulder, right upper arm. Decreased range of motion of fingers and hand due to pain. Compartments of forearm are soft. No appreciable swelling.  Neurological: She is alert. No sensory deficit.  Motor, sensation, and vascular distal to the injury is fully intact.   Skin: Skin is warm and dry.  Psychiatric: She has a normal mood and affect.    ED Course  Procedures (including critical care time) Labs Review Labs Reviewed - No data to display  Imaging Review No results found.   EKG Interpretation None      10:21 AM Patient seen and  examined. X-ray ordered at patient request, however I feel this will probably be negative.   Vital signs reviewed and are as follows: Filed Vitals:   11/14/13 1020  BP: 147/87  Pulse: 68  Temp: 98.6 F (37 C)  Resp: 16   Patient states that the pain radiates from her left arm up into her chest. Pain has been constant for a week without ceasing. Doubt ACS, but will check EKG.  11:20 AM x-ray negative. Patient informed. Patient informed of EKG  results. Will give splint for wrist.   Orthopedic and primary care referrals given. Will place patient on tramadol and meloxicam. Encourage PCP followup.  Patient was counseled to return with severe chest pain, especially if the pain is crushing or pressure-like and spreads to the arms, back, neck, or jaw, or if they have sweating, nausea, or shortness of breath with the pain. They were encouraged to call 911 with these symptoms.   They were also told to return if their chest pain gets worse and does not go away with rest, they have an attack of chest pain lasting longer than usual despite rest and treatment with the medications their caregiver has prescribed, if they wake from sleep with chest pain or shortness of breath, if they feel dizzy or faint, if they have chest pain not typical of their usual pain, or if they have any other emergent concerns regarding their health.  The patient verbalized understanding and agreed.     MDM   Final diagnoses:  Arm pain   Patient with clearly reproducible arm pain and shoulder pain. She has repetitive movements at work and I suspect this is the etiology of her symptoms. This pain radiates from her arm into her chest. This pain has been constant for week and is reproducible, I have very low suspicion for ACS. EKG was ordered and is reassuring. Do not suspect ACS.    Renne Crigler, PA-C 11/14/13 1122

## 2013-11-14 NOTE — ED Notes (Signed)
Pt c/o lt wrist pain radiating up arm that started last week w/o injury.  Also c/o chest pain.

## 2013-11-23 NOTE — ED Provider Notes (Signed)
Medical screening examination/treatment/procedure(s) were performed by non-physician practitioner and as supervising physician I was immediately available for consultation/collaboration.   EKG Interpretation   Date/Time:  Wednesday November 14 2013 10:24:59 EDT Ventricular Rate:  71 PR Interval:  155 QRS Duration: 77 QT Interval:  422 QTC Calculation: 459 R Axis:   13 Text Interpretation:  Sinus rhythm Consider right atrial enlargement Low  voltage, precordial leads Baseline wander in lead(s) V3 V5 ED PHYSICIAN  INTERPRETATION AVAILABLE IN [facility] Confirmed by TEST, Record  ([id number]) on 11/16/2013 7:20:14 AM        Suzi RootsKevin E Milfred Krammes, MD 11/23/13 1819

## 2013-11-29 ENCOUNTER — Encounter (HOSPITAL_COMMUNITY): Payer: Self-pay | Admitting: Emergency Medicine

## 2013-11-29 ENCOUNTER — Emergency Department (HOSPITAL_COMMUNITY)
Admission: EM | Admit: 2013-11-29 | Discharge: 2013-11-29 | Disposition: A | Discharge status: Home / Self Care | Payer: Medicare Other | Attending: Emergency Medicine | Admitting: Emergency Medicine

## 2013-11-29 DIAGNOSIS — Z79899 Other long term (current) drug therapy: Secondary | ICD-10-CM | POA: Insufficient documentation

## 2013-11-29 DIAGNOSIS — R21 Rash and other nonspecific skin eruption: Secondary | ICD-10-CM | POA: Diagnosis present

## 2013-11-29 DIAGNOSIS — B354 Tinea corporis: Secondary | ICD-10-CM | POA: Diagnosis not present

## 2013-11-29 DIAGNOSIS — W57XXXA Bitten or stung by nonvenomous insect and other nonvenomous arthropods, initial encounter: Secondary | ICD-10-CM | POA: Diagnosis not present

## 2013-11-29 DIAGNOSIS — F319 Bipolar disorder, unspecified: Secondary | ICD-10-CM | POA: Insufficient documentation

## 2013-11-29 DIAGNOSIS — Y939 Activity, unspecified: Secondary | ICD-10-CM | POA: Insufficient documentation

## 2013-11-29 DIAGNOSIS — E119 Type 2 diabetes mellitus without complications: Secondary | ICD-10-CM | POA: Insufficient documentation

## 2013-11-29 DIAGNOSIS — Z91018 Allergy to other foods: Secondary | ICD-10-CM | POA: Diagnosis not present

## 2013-11-29 DIAGNOSIS — Y9289 Other specified places as the place of occurrence of the external cause: Secondary | ICD-10-CM | POA: Diagnosis not present

## 2013-11-29 DIAGNOSIS — L299 Pruritus, unspecified: Secondary | ICD-10-CM | POA: Diagnosis not present

## 2013-11-29 DIAGNOSIS — Z91013 Allergy to seafood: Secondary | ICD-10-CM | POA: Diagnosis not present

## 2013-11-29 DIAGNOSIS — T148 Other injury of unspecified body region: Secondary | ICD-10-CM | POA: Insufficient documentation

## 2013-11-29 DIAGNOSIS — I10 Essential (primary) hypertension: Secondary | ICD-10-CM | POA: Insufficient documentation

## 2013-11-29 DIAGNOSIS — Z888 Allergy status to other drugs, medicaments and biological substances status: Secondary | ICD-10-CM | POA: Insufficient documentation

## 2013-11-29 DIAGNOSIS — R0789 Other chest pain: Secondary | ICD-10-CM | POA: Insufficient documentation

## 2013-11-29 MED ORDER — CLOTRIMAZOLE 1 % EX CREA: TOPICAL_CREAM | CUTANEOUS | Status: DC

## 2013-11-29 MED ORDER — DIPHENHYDRAMINE HCL 25 MG PO TABS: 25.0000 mg | ORAL_TABLET | Freq: Four times a day (QID) | ORAL | Status: DC | PRN

## 2013-11-29 MED ORDER — PREDNISONE (PAK) 10 MG PO TABS: ORAL_TABLET | Freq: Every day | ORAL | Status: DC

## 2013-11-29 NOTE — ED Provider Notes (Signed)
CSN: [id number]     Arrival date & time 11/29/13  1046 History  This chart was scribed for non-physician practitioner, Trixie DredgeEmily Tessy Pawelski, PA-C working with Rolland PorterMark James, MD by Greggory StallionKayla Andersen, ED scribe. This patient was seen in room TR11C/TR11C and the patient's care was started at 12:07 PM.   Chief Complaint  Patient presents with  . Rash  . Pruritis   The history is provided by the patient. No language interpreter was used.   HPI Comments: Emily Cochran is a 53 y.o. female with history of asthma who presents to the Emergency Department complaining of an itchy rash that started 2 weeks ago after sleeping on a friend's couch. States rash is mostly to her neck and shoulders but is also to her arms, back and legs. Reports clear drainage. Pt states she has a swollen area under her chin but denies any tenderness around it. Pt's friend had a similar rash but it is now resolved. States she has mild chest tightness that is not associated with her asthma. She states it worsens when the rash starts itching. Exercising relieves the tightness. Denies fever, lip swelling, tongue swelling, itching of the mouth, trouble swallowing, cough, SOB, leg swelling. Denies recent long distance travel or immobilization. Pt denies history of blood clots. She does not currently take any medications with estrogen in them.   Past Medical History  Diagnosis Date  . Hypertension   . Diabetes mellitus without complication   . Bipolar 1 disorder    History reviewed. No pertinent past surgical history. History reviewed. No pertinent family history. History  Substance Use Topics  . Smoking status: Never Smoker   . Smokeless tobacco: Not on file  . Alcohol Use: No   OB History   Grav Para Term Preterm Abortions TAB SAB Ect Mult Living                 Review of Systems  Constitutional: Negative for fever.  HENT: Negative for facial swelling and trouble swallowing.   Respiratory: Positive for chest tightness. Negative for  shortness of breath.   Cardiovascular: Negative for leg swelling.  Skin: Positive for rash.  All other systems reviewed and are negative.  Allergies  Coconut flavor; Shellfish allergy; and Aspirin  Home Medications   Prior to Admission medications   Medication Sig Start Date End Date Taking? Authorizing Provider  albuterol (PROVENTIL HFA;VENTOLIN HFA) 108 (90 BASE) MCG/ACT inhaler Inhale 1 puff into the lungs every 6 (six) hours as needed for wheezing or shortness of breath.    Historical Provider, MD  amLODipine (NORVASC) 5 MG tablet Take 5 mg by mouth daily.    Historical Provider, MD  glimepiride (AMARYL) 2 MG tablet Take 2 mg by mouth daily with breakfast.    Historical Provider, MD  meloxicam (MOBIC) 7.5 MG tablet Take 1 tablet (7.5 mg total) by mouth daily. 11/14/13   Renne CriglerJoshua Geiple, PA-C  predniSONE (STERAPRED UNI-PAK) 5 MG TABS tablet Take 15 mg by mouth daily.    Historical Provider, MD  pregabalin (LYRICA) 75 MG capsule Take 75 mg by mouth 2 (two) times daily.    Historical Provider, MD  QUEtiapine (SEROQUEL XR) 400 MG 24 hr tablet Take 400 mg by mouth at bedtime.    Historical Provider, MD  traMADol (ULTRAM) 50 MG tablet Take 1 tablet (50 mg total) by mouth every 6 (six) hours as needed. 11/14/13   Renne CriglerJoshua Geiple, PA-C   BP 148/97  Pulse 62  Temp(Src) 98.5 F (36.9  C) (Oral)  SpO2 100%  Physical Exam  Nursing note and vitals reviewed. Constitutional: She appears well-developed and well-nourished. No distress.  HENT:  Head: Normocephalic and atraumatic.  Mouth/Throat: Oropharynx is clear and moist.  Neck: Neck supple.  Pulmonary/Chest: Effort normal. She exhibits tenderness.  Right chest wall is tender.  Neurological: She is alert.  Skin: She is not diaphoretic.  Diffuse, raised, erythematous lesions. Discrete erythematous papules to bilateral arms. Not in intratendinous spaces. Right inner thigh with anular, crusty rash with central clearing.     ED Course  Procedures  (including critical care time)  DIAGNOSTIC STUDIES: Oxygen Saturation is 100% on RA, normal by my interpretation.    COORDINATION OF CARE: 12:12 PM-Discussed treatment plan which includes benadryl and prednisone with pt at bedside and pt agreed to plan.   Labs Review Labs Reviewed - No data to display  Imaging Review No results found.   EKG Interpretation None      Filed Vitals:   11/29/13 1101  BP: 148/97  Pulse: 62  Temp: 98.5 F (36.9 C)    MDM   Final diagnoses:  Insect bites  Tinea corporis     Afebrile nontoxic patient with rash x 2 weeks since starting to sleep on friend's couch.  Discussed eliminating the source of the insect bites. No e/o infection of the bites.  Chest wall pain is atypical, reproducible, better with exertion.  Doubt ACS, PE.  Pt d/c home with prednisone pack and benadryl.  Discussed  findings, treatment, and follow up  with patient.  Pt given return precautions.  Pt verbalizes understanding and agrees with plan.      I personally performed the services described in this documentation, which was scribed in my presence. The recorded information has been reviewed and is accurate.  Trixie Dredgemily Reiley Keisler, PA-C 11/29/13 1359

## 2013-11-29 NOTE — ED Notes (Signed)
WC refused---escorted by RN to front exit

## 2013-11-29 NOTE — Discharge Instructions (Signed)
Read the information below.  Use the prescribed medication as directed.  Please discuss all new medications with your pharmacist.  You may return to the Emergency Department at any time for worsening condition or any new symptoms that concern you.  If you develop redness, swelling, pus draining from the wound, or fevers greater than 100.4, return to the ER immediately for a recheck.     Bedbugs Bedbugs are tiny bugs that live in and around beds. During the day, they hide in mattresses and other places near beds. They come out at night and bite people lying in bed. They need blood to live and grow. Bedbugs can be found in beds anywhere. Usually, they are found in places where many people come and go (hotels, shelters, hospitals). It does not matter whether the place is dirty or clean. Getting bitten by bedbugs rarely causes a medical problem. The biggest problem can be getting rid of them. This often takes the work of a Oncologist. CAUSES  Less use of pesticides. Bedbugs were common before the 1950s. Then, strong pesticides such as DDT nearly wiped them out. Today, these pesticides are not used because they harm the environment and can cause health problems.  More travel. Besides mattresses, bedbugs can also live in clothing and luggage. They can come along as people travel from place to place. Bedbugs are more common in certain parts of the world. When people travel to those areas, the bugs can come home with them.  Presence of birds and bats. Bedbugs often infest birds and bats. If you have these animals in or near your home, bedbugs may infest your house, too. SYMPTOMS It does not hurt to be bitten by a bedbug. You will probably not wake up when you are bitten. Bedbugs usually bite areas of the skin that are not covered. Symptoms may show when you wake up, or they may take a day or more to show up. Symptoms may include:  Small red bumps on the skin. These might be lined up in a row or  clustered in a group.  A darker red dot in the middle of red bumps.  Blisters on the skin. There may be swelling and very bad itching. These may be signs of an allergic reaction. This does not happen often. DIAGNOSIS Bedbug bites might look and feel like other types of insect bites. The bugs do not stay on the body like ticks or lice. They bite, drop off, and crawl away to hide. Your caregiver will probably:  Ask about your symptoms.  Ask about your recent activities and travel.  Check your skin for bedbug bites.  Ask you to check at home for signs of bedbugs. You should look for:  Spots or stains on the bed or nearby. This could be from bedbugs that were crushed or from their eggs or waste.  Bedbugs themselves. They are reddish-brown, oval, and flat. They do not fly. They are about the size of an apple seed.  Places to look for bedbugs include:  Beds. Check mattresses, headboards, box springs, and bed frames.  On drapes and curtains near the bed.  Under carpeting in the bedroom.  Behind electrical outlets.  Behind any wallpaper that is peeling.  Inside luggage. TREATMENT Most bedbug bites do not need treatment. They usually go away on their own in a few days. The bites are not dangerous. However, treatment may be needed if you have scratched so much that your skin has become infected. You  may also need treatment if you are allergic to bedbug bites. Treatment options include:  A drug that stops swelling and itching (corticosteroid). Usually, a cream is rubbed on the skin. If you have a bad rash, you may be given a corticosteroid pill.  Oral antihistamines. These are pills to help control itching.  Antibiotic medicines. An antibiotic may be prescribed for infected skin. HOME CARE INSTRUCTIONS   Take any medicine prescribed by your caregiver for your bites. Follow the directions carefully.  Consider wearing pajamas with long sleeves and pant legs.  Your bedroom may need  to be treated. A pest control expert should make sure the bedbugs are gone. You may need to throw away mattresses or luggage. Ask the pest control expert what you can do to keep the bedbugs from coming back. Common suggestions include:  Putting a plastic cover over your mattress.  Washing and drying your clothes and bedding in hot water and a hot dryer. The temperature should be hotter than 120 F (48.9 C). Bedbugs are killed by high temperatures.  Vacuuming carefully all around your bed. Vacuum in all cracks and crevices where the bugs might hide. Do this often.  Carefully checking all used furniture, bedding, or clothes that you bring into your house.  Eliminating bird nests and bat roosts.  If you get bedbug bites when traveling, check all your possessions carefully before bringing them into your house. If you find any bugs on clothes or in your luggage, consider throwing those items away. SEEK MEDICAL CARE IF:  You have red bug bites that keep coming back.  You have red bug bites that itch badly.  You have bug bites that cause a skin rash.  You have scratch marks that are red and sore. SEEK IMMEDIATE MEDICAL CARE IF: You have a fever. Document Released: 06/26/2010 Document Revised: 08/16/2011 Document Reviewed: 06/26/2010 [facility] Patient Information 2015 [place], [place]. This information is not intended to replace advice given to you by your health care provider. Make sure you discuss any questions you have with your health care provider.  Insect Bite Mosquitoes, flies, fleas, bedbugs, and many other insects can bite. Insect bites are different from insect stings. A sting is when venom is injected into the skin. Some insect bites can transmit infectious diseases. SYMPTOMS  Insect bites usually turn red, swell, and itch for 2 to 4 days. They often go away on their own. TREATMENT  Your caregiver may prescribe antibiotic medicines if a bacterial infection develops in the  bite. HOME CARE INSTRUCTIONS  Do not scratch the bite area.  Keep the bite area clean and dry. Wash the bite area thoroughly with soap and water.  Put ice or cool compresses on the bite area.  Put ice in a plastic bag.  Place a towel between your skin and the bag.  Leave the ice on for 20 minutes, 4 times a day for the first 2 to 3 days, or as directed.  You may apply a baking soda paste, cortisone cream, or calamine lotion to the bite area as directed by your caregiver. This can help reduce itching and swelling.  Only take over-the-counter or prescription medicines as directed by your caregiver.  If you are given antibiotics, take them as directed. Finish them even if you start to feel better. You may need a tetanus shot if:  You cannot remember when you had your last tetanus shot.  You have never had a tetanus shot.  The injury broke your skin. If  you get a tetanus shot, your arm may swell, get red, and feel warm to the touch. This is common and not a problem. If you need a tetanus shot and you choose not to have one, there is a rare chance of getting tetanus. Sickness from tetanus can be serious. SEEK IMMEDIATE MEDICAL CARE IF:   You have increased pain, redness, or swelling in the bite area.  You see a red line on the skin coming from the bite.  You have a fever.  You have joint pain.  You have a headache or neck pain.  You have unusual weakness.  You have a rash.  You have chest pain or shortness of breath.  You have abdominal pain, nausea, or vomiting.  You feel unusually tired or sleepy. MAKE SURE YOU:   Understand these instructions.  Will watch your condition.  Will get help right away if you are not doing well or get worse. Document Released: 07/01/2004 Document Revised: 08/16/2011 Document Reviewed: 12/23/2010 [facility] Patient Information 2015 [place], [place]. This information is not intended to replace advice given to you by your health care  provider. Make sure you discuss any questions you have with your health care provider.    Emergency Department Resource Guide 1) Find a Doctor and Pay Out of Pocket Although you won't have to find out who is covered by your insurance plan, it is a good idea to ask around and get recommendations. You will then need to call the office and see if the doctor you have chosen will accept you as a new patient and what types of options they offer for patients who are self-pay. Some doctors offer discounts or will set up payment plans for their patients who do not have insurance, but you will need to ask so you aren't surprised when you get to your appointment.  2) Contact Your Local Health Department Not all health departments have doctors that can see patients for sick visits, but many do, so it is worth a call to see if yours does. If you don't know where your local health department is, you can check in your phone book. The CDC also has a tool to help you locate your state's health department, and many state websites also have listings of all of their local health departments.  3) Find a Walk-in Clinic If your illness is not likely to be very severe or complicated, you may want to try a walk in clinic. These are popping up all over the country in pharmacies, drugstores, and shopping centers. They're usually staffed by nurse practitioners or physician assistants that have been trained to treat common illnesses and complaints. They're usually fairly quick and inexpensive. However, if you have serious medical issues or chronic medical problems, these are probably not your best option.  No Primary Care Doctor: - Call Health Connect at  [phone] - they can help you locate a primary care doctor that  accepts your insurance, provides certain services, etc. - Physician Referral Service- [phone]  Chronic Pain Problems: Organization         Address  Phone   Notes  [facility]  [phone] Patients need to be referred by their primary care doctor.   Medication Assistance: Organization         Address  Phone   Notes  [facility] Medication [facility] [address] [place], [place] [address] [phone] --Must be a resident of [facility] -- Must  have NO insurance coverage whatsoever (no Medicaid/ Medicare, etc.) -- The pt. MUST have a primary care doctor that directs their care regularly and follows them in the community   [facility]  [phone]   [employer]  [phone]    Agencies that provide inexpensive medical care: Organization         Address  Phone   Notes  [facility]  [phone]   [facility]    [phone]   [facility] [address] [place], [place] [address] [phone]   [facility] [address] [place] [place] [phone]   Planned Parenthood    [phone]   [facility]    [phone]   [facility]  [address], [place] Phone:  [phone], Fax:  [phone] Hours of Operation:  9 am - 6 pm, M-F.  Also accepts Medicaid/Medicare and self-pay.  [facility]  [address], [place] Phone: [phone], Fax: [phone]. Hours of Operation:  8:30 am - 5:30 pm, M-F.  Also accepts Medicaid and self-pay.  [facility] [place] [address] [place] Phone: [phone]   [facility] [address] Natasha Bence [employer], [place] [phone], Ext. [phone] Mondays & Thursdays: 7-9 AM.  First 15 patients are seen on a first come, first serve basis.    Medicaid-accepting [facility] Providers:  Organization         Address  Phone   Notes  [facility] [address], [place] [phone] Also accepts self-pay patients.  [facility] [address] Laurell Josephs [place], [place]   [phone]   [facility] [address], [place] [phone]   [facility] [address], [place] [phone]   Renaye Rakers [address], [place]   [phone] Only accepts [place] patients after they have their name applied to their card.   Self-Pay (no insurance) in [facility]:  Organization         Address  Phone   Notes  Sickle Cell Patients, [facility] Internal Medicine [address] [place], [place] [phone]   [facility] [address] [place], [place] [phone]   [facility]  [address], [place] [phone]   [facility]/Dr. Osei-Bonsu  [address] [place] or [address], [place] [phone] Phone number for both [place] and [place] locations is the same.  [facility] [address] [place] [phone]   [facility] [address], [place] or [address] [phone]   [facility] [address] [place] [phone], phone; [phone], fax Sees patients 1st and 3rd Saturday of every month.  Must not qualify for public or private insurance (i.e. Medicaid, Medicare, [place] Health Choice, Veterans' Benefits)  Household income should be no more than 200% of the poverty level The clinic cannot treat you if you are pregnant or think you are pregnant  Sexually transmitted diseases are not treated at the clinic.    Dental Care: Organization         Address  Phone  Notes  [facility] Department of [facility] [address] [place], [place] [phone] Accepts children up to age 28 who are enrolled in [place] or [place] Health Choice; pregnant women with a Medicaid card; and children who have applied for Medicaid or [place] Health Choice, but  were declined, whose parents can pay a reduced fee at time of service.  [facility] of [facility]  [address], [place] [phone] Accepts children up to age 33 who are enrolled in [place] or [place] Health Choice; pregnant women with a Medicaid card; and children who have applied for Medicaid or [place] Health Choice, but were declined, whose parents can pay a reduced fee at time of service.  [facility]  [address] [place], [place] [phone] Patients are seen by appointment only. Walk-ins are not accepted. [facility] will see patients 81 years of age and older. Monday - Tuesday (8am-5pm) Most Wednesdays (8:30-5pm) $30 per visit, cash only  [facility] Adult Dental Access PROGRAM  [address], [facility] [phone] Patients are seen by appointment only. Walk-ins are not accepted. [facility] will see patients 52 years of age and older. One Wednesday Evening (Monthly: Volunteer Based).  $30 per visit, cash only  [employer]  [phone] for adults; Children under age 31, call Graduate Pediatric Dentistry at [phone]. Children aged 81-14, please call [phone] to request a pediatric application.  Dental services are provided in all areas of dental care including fillings, crowns and bridges, complete and partial dentures, implants, gum treatment, root canals, and extractions. Preventive care is also provided. Treatment is provided to both adults and children. Patients are selected via a lottery and there is often a waiting list.   [facility] [address] [place]  [phone] [email or web address]   [facility] [address] [place], [place] [phone], Ext. [phone] Second and Fourth Thursday of each month, opens at 6:30 AM; Clinic ends at 9 AM.  Patients are seen on a first-come first-served basis, and a limited number are seen during each clinic.    [facility]  [address] Ether Griffins [place], [place] [phone]   Eligibility Requirements You must have lived in [place], [place], or [place] counties for at least the last three months.   You cannot be eligible for state or federal sponsored [place], including [employer], [place], or [employer].   You generally cannot be eligible for healthcare insurance through your employer.    How to apply: Eligibility screenings are held every Tuesday and Wednesday afternoon from 1:00 pm until 4:00 pm. You do not need an appointment for the interview!  [facility] [address], [place] Jethro Radke, [place] [phone]   [facility] Health Department  [phone]   [facility] Health Department  [phone]   [facility] Health Department  [phone]    Behavioral Health Resources in the Community: Intensive Outpatient Programs Organization         Address  Phone  Notes  [facility] Services [address] [place], [place] [phone]   [facility] Outpatient [address] [place], [place] [phone]   [facility] [address], [place], [place]  [phone]   [facility] Mental Health [address],  [place], [place] [phone] or [phone]   Substance Abuse Resources Organization  Address  Phone  Notes  [facility]  [phone]   [facility]  [phone]   [facility]  [phone]   [facility]  [phone]   Residential & Outpatient Substance Abuse Program  [phone]   Psychological Services Organization         Address  Phone  Notes  [facility]  [phone]- [phone]   [facility]  [phone]- [phone]   [facility] [address], [place] [phone] or [phone]    Mobile Crisis Teams Organization         Address  Phone  Notes  [facility], Mobile Crisis Care  Unit  [phone]   [facility]  [address] [place], [place] [phone]   Doristine LocksSharon DeEsch [address] [place] [phone]    Self-Help/Support Groups Organization         Address  Phone             Notes  [facility] [place] - variety of support groups  336- [phone] Call for more information  Narcotics Anonymous (NA), [facility] [address], [place]  2 meetings at this location   Statisticianesidential Treatment Programs Organization         Address  Phone  Notes  [facility] [address],    [place]  [phone]   [facility]  [address], [place] [phone], [place], [place] [phone]   [facility] [address], [place] [phone] Admissions: 8am-3pm M-F  [facility] [address]    [place], [place] [phone]   [facility] [address], [place], [place] [phone]   [facility] [address]  [place], [place] [phone]   [facility] - Intensive Outpatient [address], [place], [place] [phone]   [facility] ([facility] [address]  [place], [place] [phone] or [phone]   [facility] ([facility]) [address] [place], [place] [phone] Accepts Medicaid  [facility] [address]  [place] [phone] Substance Abuse/Addiction Treatment   [facility] Behavioral Health Resources Organization         Address  Phone  Notes  [facility]  [phone]   Angie FavaJulie Brannon, PhD [address] [place], [place]   [phone](336) [phone] or [phone]) [phone]   [facility]   [address] [place], [place] [phone]   [facility] [address], [place], [place] [phone]) [phone] Insurance/Medicaid/sponsorship through [facility]  [facility] [address]                                     [place], [place] [phone]) [phone] Therapy/tele-psych/case  [facility] [address]   [place], [place] [phone]    Dr. Lolly MustacheArfeen  [phone]   [facility] [place]  [employer] [facility] 1) [address], [place] 2) [address], [place] 3)  [address], [place] [phone]  [phone](336) [phone]   [facility] Child Abuse Hotline [phone](336) [phone] or [phone]) [phone] (After Hours)

## 2013-11-29 NOTE — ED Notes (Signed)
Pt reports staying at a friends house. Pt and her friend have been itching for the last 2 weeks. Pt awake, alert, NAD at present.

## 2013-12-03 NOTE — ED Provider Notes (Signed)
Medical screening examination/treatment/procedure(s) were performed by non-physician practitioner and as supervising physician I was immediately available for consultation/collaboration.   EKG Interpretation None        Mark James, MD 12/03/13 2143 

## 2013-12-27 ENCOUNTER — Emergency Department (HOSPITAL_COMMUNITY)
Admission: EM | Admit: 2013-12-27 | Discharge: 2013-12-27 | Disposition: A | Discharge status: Home / Self Care | Payer: Medicare Other | Attending: Emergency Medicine | Admitting: Emergency Medicine

## 2013-12-27 ENCOUNTER — Encounter (HOSPITAL_COMMUNITY): Payer: Self-pay | Admitting: Emergency Medicine

## 2013-12-27 DIAGNOSIS — I10 Essential (primary) hypertension: Secondary | ICD-10-CM | POA: Diagnosis not present

## 2013-12-27 DIAGNOSIS — Y939 Activity, unspecified: Secondary | ICD-10-CM | POA: Diagnosis not present

## 2013-12-27 DIAGNOSIS — IMO0002 Reserved for concepts with insufficient information to code with codable children: Secondary | ICD-10-CM | POA: Insufficient documentation

## 2013-12-27 DIAGNOSIS — W57XXXA Bitten or stung by nonvenomous insect and other nonvenomous arthropods, initial encounter: Secondary | ICD-10-CM | POA: Diagnosis not present

## 2013-12-27 DIAGNOSIS — Z79899 Other long term (current) drug therapy: Secondary | ICD-10-CM | POA: Diagnosis not present

## 2013-12-27 DIAGNOSIS — E119 Type 2 diabetes mellitus without complications: Secondary | ICD-10-CM | POA: Insufficient documentation

## 2013-12-27 DIAGNOSIS — S90569A Insect bite (nonvenomous), unspecified ankle, initial encounter: Secondary | ICD-10-CM | POA: Insufficient documentation

## 2013-12-27 DIAGNOSIS — Y92009 Unspecified place in unspecified non-institutional (private) residence as the place of occurrence of the external cause: Secondary | ICD-10-CM | POA: Diagnosis not present

## 2013-12-27 DIAGNOSIS — F319 Bipolar disorder, unspecified: Secondary | ICD-10-CM | POA: Diagnosis not present

## 2013-12-27 DIAGNOSIS — S40269A Insect bite (nonvenomous) of unspecified shoulder, initial encounter: Secondary | ICD-10-CM | POA: Insufficient documentation

## 2013-12-27 MED ORDER — HYDROXYZINE HCL 25 MG PO TABS: 25.0000 mg | ORAL_TABLET | Freq: Four times a day (QID) | ORAL | Status: DC

## 2013-12-27 MED ORDER — PREDNISONE 20 MG PO TABS: ORAL_TABLET | ORAL | Status: DC

## 2013-12-27 NOTE — Discharge Instructions (Signed)
Please read and follow all provided instructions.  Your diagnoses today include:  1. Insect bite     Tests performed today include:  Vital signs. See below for your results today.   Medications prescribed:   Hydroxyzine - antihistamine  You can find this medication over-the-counter.   This medication will make you drowsy. DO NOT drive or perform any activities that require you to be awake and alert if taking this.  Take any prescribed medications only as directed.   Prednisone - steroid medicine   It is best to take this medication in the morning to prevent sleeping problems. If you are diabetic, monitor your blood sugar closely and stop taking Prednisone if blood sugar is over 300. Take with food to prevent stomach upset.   Home care instructions:   Follow any educational materials contained in this packet  Follow-up instructions: Please follow-up with your primary care provider in the next 3 days for further evaluation of your symptoms.   Return instructions:   Please return to the Emergency Department if you experience worsening symptoms.   Call 9-1-1 immediately if you have an allergic reaction that involves your lips, mouth, throat or if you have any difficulty breathing. This is a life-threatening emergency.   Please return if you have any other emergent concerns.  Additional Information:  Your vital signs today were: BP 131/86   Pulse 84   Temp(Src) 98 F (36.7 C) (Oral)   Resp 18   SpO2 100% If your blood pressure (BP) was elevated above 135/85 this visit, please have this repeated by your doctor within one month. --------------

## 2013-12-27 NOTE — ED Notes (Signed)
Pt seen in June for bed bugs.  Pt treated and rash went away.  Pt returns with same symptoms

## 2013-12-27 NOTE — ED Provider Notes (Signed)
CSN: [id number]     Arrival date & time 12/27/13  1018 History   First MD Initiated Contact with Patient 12/27/13 1020     Chief Complaint  Patient presents with  . Insect Bite     (Consider location/radiation/quality/duration/timing/severity/associated sxs/prior Treatment) HPI Comments: Patient presents with complaint of itchy rash on arms and legs for the past 6 weeks. Patient was seen for this 4 weeks ago. Symptoms are the same as previous. When she was seen initially, she was prescribed Benadryl and prednisone which improved symptoms while she was taking them. She is still living in the same place and is having continued bed bugs and flea bites. Patient visualized the insects at home. People living in the same place have similar symptoms. No fever, nausea, or vomiting. No new medications or other skin exposures. Onset of symptoms acute. Course is constant. Nothing makes symptoms worse.  The history is provided by the patient.    Past Medical History  Diagnosis Date  . Hypertension   . Diabetes mellitus without complication   . Bipolar 1 disorder    History reviewed. No pertinent past surgical history. History reviewed. No pertinent family history. History  Substance Use Topics  . Smoking status: Never Smoker   . Smokeless tobacco: Not on file  . Alcohol Use: No   OB History   Grav Para Term Preterm Abortions TAB SAB Ect Mult Living                 Review of Systems  Constitutional: Negative for fever.  HENT: Negative for facial swelling and trouble swallowing.   Eyes: Negative for redness.  Respiratory: Negative for shortness of breath, wheezing and stridor.   Cardiovascular: Negative for chest pain.  Gastrointestinal: Negative for nausea and vomiting.  Musculoskeletal: Negative for myalgias.  Skin: Positive for rash.  Neurological: Negative for light-headedness.  Psychiatric/Behavioral: Negative for confusion.      Allergies  Coconut flavor; Shellfish allergy;  Aspirin; and Eggs or egg-derived products  Home Medications   Prior to Admission medications   Medication Sig Start Date End Date Taking? Authorizing Provider  albuterol (PROVENTIL HFA;VENTOLIN HFA) 108 (90 BASE) MCG/ACT inhaler Inhale 1 puff into the lungs every 6 (six) hours as needed for wheezing or shortness of breath.   Yes Historical Provider, MD  amLODipine (NORVASC) 5 MG tablet Take 5 mg by mouth daily.   Yes Historical Provider, MD  glimepiride (AMARYL) 2 MG tablet Take 2 mg by mouth daily with breakfast.   Yes Historical Provider, MD  pregabalin (LYRICA) 75 MG capsule Take 75 mg by mouth 2 (two) times daily as needed (pain).    Yes Historical Provider, MD  QUEtiapine (SEROQUEL XR) 400 MG 24 hr tablet Take 400 mg by mouth at bedtime.   Yes Historical Provider, MD   BP 131/86  Pulse 84  Temp(Src) 98 F (36.7 C) (Oral)  Resp 18  SpO2 100% Physical Exam  Nursing note and vitals reviewed. Constitutional: She appears well-developed and well-nourished.  HENT:  Head: Normocephalic and atraumatic.  Eyes: Conjunctivae are normal. Right eye exhibits no discharge. Left eye exhibits no discharge.  Neck: Normal range of motion. Neck supple.  Cardiovascular: Normal rate, regular rhythm and normal heart sounds.   Pulmonary/Chest: Effort normal and breath sounds normal.  Abdominal: Soft. There is no tenderness.  Neurological: She is alert.  Skin: Skin is warm and dry.  Patient with scattered papular lesions with excoriations upper and lower extremities. Very few noted on abdomen  and back. Consistent with flea or insect bites. Pattern not consistent with scabies. No oral lesions.  Psychiatric: She has a normal mood and affect.    ED Course  Procedures (including critical care time) Labs Review Labs Reviewed - No data to display  Imaging Review No results found.   EKG Interpretation None      10:38 AM Patient seen and examined.   Vital signs reviewed and are as follows: Filed  Vitals:   12/27/13 1028  BP: 131/86  Pulse: 84  Temp: 98 F (36.7 C)  Resp: 18   Will pretreat with prednisone and Atarax. She has a history of diabetes and is on glyburide and did well with previous course of prednisone. Patient is she needs to check sugars frequently.  Discussed at length the patient needs to either clean and exterminate these insects where she lives or move to a new place. She verbalizes understanding and agrees with plan.   MDM   Final diagnoses:  Insect bite   Patient with flea and bed bugs bites. No signs of anaphylaxis or other allergic reaction. No signs of contact dermatitis. No signs of scabies.    Renne Crigler, PA-C 12/27/13 1045

## 2013-12-27 NOTE — ED Notes (Signed)
PA at bedside.

## 2013-12-28 NOTE — ED Provider Notes (Signed)
Medical screening examination/treatment/procedure(s) were performed by non-physician practitioner and as supervising physician I was immediately available for consultation/collaboration.   Candyce ChurnJohn David Ezeriah Luty III, MD 12/28/13 0830

## 2014-02-10 ENCOUNTER — Emergency Department (HOSPITAL_COMMUNITY)
Admission: EM | Admit: 2014-02-10 | Discharge: 2014-02-10 | Disposition: A | Discharge status: Home / Self Care | Payer: Medicare Other | Attending: Emergency Medicine | Admitting: Emergency Medicine

## 2014-02-10 ENCOUNTER — Emergency Department (HOSPITAL_COMMUNITY): Payer: Medicare Other

## 2014-02-10 ENCOUNTER — Encounter (HOSPITAL_COMMUNITY): Payer: Self-pay | Admitting: Emergency Medicine

## 2014-02-10 DIAGNOSIS — E119 Type 2 diabetes mellitus without complications: Secondary | ICD-10-CM | POA: Diagnosis not present

## 2014-02-10 DIAGNOSIS — M25569 Pain in unspecified knee: Secondary | ICD-10-CM | POA: Insufficient documentation

## 2014-02-10 DIAGNOSIS — Z79899 Other long term (current) drug therapy: Secondary | ICD-10-CM | POA: Insufficient documentation

## 2014-02-10 DIAGNOSIS — I10 Essential (primary) hypertension: Secondary | ICD-10-CM | POA: Insufficient documentation

## 2014-02-10 DIAGNOSIS — IMO0002 Reserved for concepts with insufficient information to code with codable children: Secondary | ICD-10-CM | POA: Insufficient documentation

## 2014-02-10 DIAGNOSIS — M170 Bilateral primary osteoarthritis of knee: Secondary | ICD-10-CM

## 2014-02-10 DIAGNOSIS — M1710 Unilateral primary osteoarthritis, unspecified knee: Secondary | ICD-10-CM | POA: Insufficient documentation

## 2014-02-10 DIAGNOSIS — F319 Bipolar disorder, unspecified: Secondary | ICD-10-CM | POA: Diagnosis not present

## 2014-02-10 MED ORDER — PREDNISONE 50 MG PO TABS: 50.0000 mg | ORAL_TABLET | Freq: Every day | ORAL | Status: DC

## 2014-02-10 MED ORDER — HYDROCODONE-ACETAMINOPHEN 5-325 MG PO TABS: 1.0000 | ORAL_TABLET | Freq: Four times a day (QID) | ORAL | Status: DC | PRN

## 2014-02-10 MED ORDER — HYDROCODONE-ACETAMINOPHEN 5-325 MG PO TABS
1.0000 | ORAL_TABLET | Freq: Once | ORAL | Status: AC
  Administered 2014-02-10: 1 via ORAL
  Filled 2014-02-10: qty 1

## 2014-02-10 NOTE — ED Provider Notes (Signed)
CSN: [id number]     Arrival date & time 02/10/14  1648 History  This chart was scribed for Charlestine Night, PA-C, working with Ward Givens, MD by Chestine Spore, ED Scribe. The patient was seen in room WTR7/WTR7 at 5:14 PM.   Chief Complaint  Patient presents with  . Knee Pain      The history is provided by the patient. No language interpreter was used.   HPI Comments: Emily Cochran is a 53 y.o. female who presents to the Emergency Department complaining of bilateral knee pain onset 1 weeek. She states that the pain is constant. She denies any increase in activity, fall, injury or previous knee pain. She denies any other associated symptoms. She states that she is allergic to ASA and when she takes it her lips swell. She states that her PCP is Burtis Junes, MD   Past Medical History  Diagnosis Date  . Hypertension   . Diabetes mellitus without complication   . Bipolar 1 disorder    Past Surgical History  Procedure Laterality Date  . Abdominal hysterectomy     No family history on file. History  Substance Use Topics  . Smoking status: Never Smoker   . Smokeless tobacco: Not on file  . Alcohol Use: No   OB History   Grav Para Term Preterm Abortions TAB SAB Ect Mult Living                 Review of Systems  Musculoskeletal: Positive for arthralgias (bilateral knee pain).    Allergies  Coconut flavor; Shellfish allergy; Aspirin; and Eggs or egg-derived products  Home Medications   Prior to Admission medications   Medication Sig Start Date End Date Taking? Authorizing Provider  albuterol (PROVENTIL HFA;VENTOLIN HFA) 108 (90 BASE) MCG/ACT inhaler Inhale 1 puff into the lungs every 6 (six) hours as needed for wheezing or shortness of breath.   Yes Historical Provider, MD  amLODipine (NORVASC) 5 MG tablet Take 5 mg by mouth daily.   Yes Historical Provider, MD  pregabalin (LYRICA) 75 MG capsule Take 75 mg by mouth 2 (two) times daily as needed (pain).    Yes  Historical Provider, MD  QUEtiapine (SEROQUEL XR) 400 MG 24 hr tablet Take 400 mg by mouth at bedtime.   Yes Historical Provider, MD   BP 152/89  Pulse 84  Temp(Src) 99 F (37.2 C) (Oral)  Resp 18  SpO2 98%  Physical Exam  Nursing note and vitals reviewed. Constitutional: She is oriented to person, place, and time. She appears well-developed and well-nourished. No distress.  HENT:  Head: Normocephalic and atraumatic.  Cardiovascular: Normal rate.   Pulmonary/Chest: Effort normal. No respiratory distress.  Musculoskeletal: Normal range of motion.  Diffused knee pain on the posterior and anterior aspect of knees bilaterally.   Neurological: She is alert and oriented to person, place, and time.  Skin: Skin is warm and dry.  Psychiatric: She has a normal mood and affect. Her behavior is normal.    ED Course  Procedures (including critical care time). DIAGNOSTIC STUDIES: Oxygen Saturation is 98% on room air, normal by my interpretation.    COORDINATION OF CARE: 5:16 PM-Discussed treatment plan which includes X-ray of left and right knee with pt at bedside and pt agreed to plan.   The patient will be treated for osteoarthritis.  Told to followup with her primary care Dr. return here as needed.  Ice and heat to both knees  I personally performed the services  described in this documentation, which was scribed in my presence. The recorded information has been reviewed and is accurate.    Carlyle Dolly, PA-C 02/10/14 [phone]

## 2014-02-10 NOTE — Discharge Instructions (Signed)
Return here as needed.  Followup with your primary care Dr. use ice and heat on your knees.  Your x-rays show you have arthritis in your knees

## 2014-02-10 NOTE — ED Notes (Signed)
Pt reports having bilateral knee pain that began one week ago. Pt denies fall, injury, or previous knee pain. Pt is A/O x4, ambulatory to triage, vitals are WDL, and pt is in NAD.

## 2014-02-10 NOTE — ED Provider Notes (Signed)
Medical screening examination/treatment/procedure(s) were performed by non-physician practitioner and as supervising physician I was immediately available for consultation/collaboration.   EKG Interpretation None      Devoria Albe, MD, Armando Gang   Ward Givens, MD 02/10/14 2253

## 2015-01-06 ENCOUNTER — Other Ambulatory Visit: Payer: Self-pay | Admitting: Specialist

## 2015-01-06 DIAGNOSIS — Z1231 Encounter for screening mammogram for malignant neoplasm of breast: Secondary | ICD-10-CM

## 2015-01-14 ENCOUNTER — Ambulatory Visit: Payer: Medicare Other

## 2015-08-25 ENCOUNTER — Emergency Department (HOSPITAL_COMMUNITY): Payer: Medicare Other

## 2015-08-25 ENCOUNTER — Encounter (HOSPITAL_COMMUNITY): Payer: Self-pay | Admitting: Emergency Medicine

## 2015-08-25 ENCOUNTER — Emergency Department (HOSPITAL_COMMUNITY)
Admission: EM | Admit: 2015-08-25 | Discharge: 2015-08-25 | Disposition: A | Discharge status: Home / Self Care | Payer: Medicare Other | Attending: Emergency Medicine | Admitting: Emergency Medicine

## 2015-08-25 DIAGNOSIS — E119 Type 2 diabetes mellitus without complications: Secondary | ICD-10-CM | POA: Insufficient documentation

## 2015-08-25 DIAGNOSIS — S8991XA Unspecified injury of right lower leg, initial encounter: Secondary | ICD-10-CM | POA: Insufficient documentation

## 2015-08-25 DIAGNOSIS — Y998 Other external cause status: Secondary | ICD-10-CM | POA: Diagnosis not present

## 2015-08-25 DIAGNOSIS — Y9289 Other specified places as the place of occurrence of the external cause: Secondary | ICD-10-CM | POA: Diagnosis not present

## 2015-08-25 DIAGNOSIS — I10 Essential (primary) hypertension: Secondary | ICD-10-CM | POA: Diagnosis not present

## 2015-08-25 DIAGNOSIS — Z79899 Other long term (current) drug therapy: Secondary | ICD-10-CM | POA: Insufficient documentation

## 2015-08-25 DIAGNOSIS — Y9389 Activity, other specified: Secondary | ICD-10-CM | POA: Diagnosis not present

## 2015-08-25 DIAGNOSIS — F319 Bipolar disorder, unspecified: Secondary | ICD-10-CM | POA: Diagnosis not present

## 2015-08-25 DIAGNOSIS — W1841XA Slipping, tripping and stumbling without falling due to stepping on object, initial encounter: Secondary | ICD-10-CM | POA: Diagnosis not present

## 2015-08-25 DIAGNOSIS — M25561 Pain in right knee: Secondary | ICD-10-CM

## 2015-08-25 MED ORDER — TRAMADOL HCL 50 MG PO TABS: 50.0000 mg | ORAL_TABLET | Freq: Four times a day (QID) | ORAL | Status: DC | PRN

## 2015-08-25 MED ORDER — OXYCODONE-ACETAMINOPHEN 5-325 MG PO TABS: 1.0000 | ORAL_TABLET | ORAL | Status: DC | PRN

## 2015-08-25 MED ORDER — KETOROLAC TROMETHAMINE 60 MG/2ML IM SOLN
60.0000 mg | Freq: Once | INTRAMUSCULAR | Status: AC
  Administered 2015-08-25: 60 mg via INTRAMUSCULAR
  Filled 2015-08-25: qty 2

## 2015-08-25 NOTE — Progress Notes (Signed)
Orthopedic Tech Progress Note Patient Details:  Emily MarlinRessie M Cochran Apr 24, 1961 [id number]  Ortho Devices Type of Ortho Device: Knee Immobilizer, Crutches Ortho Device/Splint Interventions: Application   Saul FordyceJennifer C Kristi Cochran 08/25/2015, 6:19 PM

## 2015-08-25 NOTE — ED Provider Notes (Signed)
CSN: [id number]     Arrival date & time 08/25/15  1549 History  By signing my name below, I, Linna DarnerRussell Turner, attest that this documentation has been prepared under the direction and in the presence of non-physician practitioner, Harolyn RutherfordShawn Joy, PA-C. Electronically Signed: Linna Darnerussell Turner, Scribe. 08/25/2015. 4:30 PM.    Chief Complaint  Patient presents with  . Knee Pain    The history is provided by the patient. No language interpreter was used.     HPI Comments: Mattie MarlinRessie M Brayboy is a 55 y.o. female with h/o HTN, DM, and CAD who presents to the Emergency Department complaining of sudden onset, constant, shooting, 10/10 right knee pain and soreness beginning 9 days ago. She states that she stepped out of her daughter's truck 9 days ago and felt something pull and pop in her right knee. She endorses significant right knee pain exacerbation with bending, straightening, and ambulating. She states that she [place] in [place] who told her that she may have torn ligaments in her right knee; she did not have a right knee X-ray taken. Pt tried to see her PCP in [place] this morning but could not be seen until tomorrow; she came to the ER today because she is unable to tolerate her right knee pain any longer. She endorses difficulty sleeping due to right knee pain. She notes continued pulling and popping sensations in her right knee. Pt also endorses associated right knee swelling. She denies any prior right knee injuries or right knee surgeries. Pt denies neuro deficits, head injury, LOC, falls, fever/chills, or any other associated symptoms or injuries.   Past Medical History  Diagnosis Date  . Hypertension   . Diabetes mellitus without complication (HCC)   . Bipolar 1 disorder [facility](HCC)    Past Surgical History  Procedure Laterality Date  . Abdominal hysterectomy     History reviewed. No pertinent family history. Social History  Substance Use Topics  . Smoking status: Never  Smoker   . Smokeless tobacco: None  . Alcohol Use: No   OB History    No data available     Review of Systems  Musculoskeletal: Positive for joint swelling (right knee) and arthralgias (right knee).  Neurological: Negative for numbness.      Allergies  Coconut flavor; Shellfish allergy; Ultram; Aspirin; and Eggs or egg-derived products  Home Medications   Prior to Admission medications   Medication Sig Start Date End Date Taking? Authorizing Provider  albuterol (PROVENTIL HFA;VENTOLIN HFA) 108 (90 BASE) MCG/ACT inhaler Inhale 1 puff into the lungs every 6 (six) hours as needed for wheezing or shortness of breath.    Historical Provider, MD  amLODipine (NORVASC) 5 MG tablet Take 5 mg by mouth daily.    Historical Provider, MD  HYDROcodone-acetaminophen (NORCO/VICODIN) 5-325 MG per tablet Take 1 tablet by mouth every 6 (six) hours as needed for moderate pain. 02/10/14   Charlestine Nighthristopher Lawyer, PA-C  oxyCODONE-acetaminophen (PERCOCET/ROXICET) 5-325 MG tablet Take 1 tablet by mouth every 4 (four) hours as needed for severe pain. 08/25/15   Shawn C Joy, PA-C  predniSONE (DELTASONE) 50 MG tablet Take 1 tablet (50 mg total) by mouth daily. 02/10/14   Charlestine Nighthristopher Lawyer, PA-C  pregabalin (LYRICA) 75 MG capsule Take 75 mg by mouth 2 (two) times daily as needed (pain).     Historical Provider, MD  QUEtiapine (SEROQUEL XR) 400 MG 24 hr tablet Take 400 mg by mouth at bedtime.    Historical Provider, MD  BP 131/95 mmHg  Pulse 95  Temp(Src) 98 F (36.7 C) (Oral)  Resp 18  SpO2 97% Physical Exam  Constitutional: She appears well-developed and well-nourished. No distress.  HENT:  Head: Normocephalic and atraumatic.  Eyes: Conjunctivae are normal.  Cardiovascular: Normal rate and regular rhythm.   Pulmonary/Chest: Effort normal.  Musculoskeletal:  Tenderness and swelling noted to the anterior right knee. Range of motion intact. Possible joint effusion present. No increased warmth or erythema. No  laxity, crepitus, or deformity noted.  Neurological: She is alert. She has normal reflexes.  Skin: Skin is warm and dry. She is not diaphoretic.  Nursing note and vitals reviewed.   ED Course  Procedures (including critical care time)  DIAGNOSTIC STUDIES: Oxygen Saturation is 97% on RA, normal by my interpretation.    COORDINATION OF CARE: 4:30 PM Will order DG Knee Complete 4 Views Right. Discussed treatment plan with pt at bedside and pt agreed to plan.    Imaging Review Dg Knee Complete 4 Views Right  08/25/2015  CLINICAL DATA:  55 year old female with history of right-sided knee pain for the past 9 days. Limited range of motion. Painful ambulation. EXAM: RIGHT KNEE - COMPLETE 4+ VIEW COMPARISON:  02/10/2014. FINDINGS: Large suprapatellar effusion. No acute displaced fracture, subluxation or dislocation. There is mild joint space narrowing, subchondral sclerosis and osteophyte formation, most evident in the medial compartment. IMPRESSION: 1. Large suprapatellar joint effusion. 2. Mild degenerative changes of osteoarthritis most severe in the medial compartment. Electronically Signed   By: Trudie Reed M.D.   On: 08/25/2015 17:52   I have personally reviewed and evaluated these images as part of my medical decision-making.   EKG Interpretation None      MDM   Final diagnoses:  Right knee pain    Riyan ROSSETTA KAMA presents with right knee pain following an injury 9 days ago.  No indication of fracture or dislocation on x-ray. Evidence of joint effusion noted. Very low suspicion for septic joint, as there is no pain out of proportion with exam and the patient is weightbearing.He knee arthrocentesis and further management would be best handled by orthopedics. Patient placed in a knee immobilizer, given crutches, pain management, and orthopedic referral. Return precautions discussed. Patient voiced understanding of these instructions and is comfortable with discharge. Patient  appears safe for discharge at this time.  I personally performed the services described in this documentation, which was scribed in my presence. The recorded information has been reviewed and is accurate.    Anselm Pancoast, PA-C 08/25/15 1832  Tilden Fossa, MD 08/25/15 2123

## 2015-08-25 NOTE — Discharge Instructions (Signed)
You have been seen today for a knee injury. There were no fractures or dislocations on your x-ray. Follow-up with orthopedics as soon as possible for reassessment and chronic management of this issue. Follow up with PCP as needed. Return to ED should symptoms worsen.

## 2015-08-25 NOTE — ED Notes (Signed)
Pt sts bilateral knee pain after stepping wrong last week

## 2015-08-25 NOTE — ED Notes (Signed)
Declined W/C at D/C and was escorted to lobby by RN. 

## 2015-09-26 ENCOUNTER — Emergency Department (HOSPITAL_COMMUNITY)
Admission: EM | Admit: 2015-09-26 | Discharge: 2015-09-26 | Disposition: A | Discharge status: Home / Self Care | Payer: Medicare Other | Attending: Emergency Medicine | Admitting: Emergency Medicine

## 2015-09-26 ENCOUNTER — Encounter (HOSPITAL_COMMUNITY): Payer: Self-pay

## 2015-09-26 ENCOUNTER — Emergency Department (HOSPITAL_COMMUNITY): Payer: Medicare Other

## 2015-09-26 DIAGNOSIS — M25562 Pain in left knee: Secondary | ICD-10-CM | POA: Diagnosis not present

## 2015-09-26 DIAGNOSIS — M25512 Pain in left shoulder: Secondary | ICD-10-CM | POA: Insufficient documentation

## 2015-09-26 DIAGNOSIS — F209 Schizophrenia, unspecified: Secondary | ICD-10-CM | POA: Diagnosis not present

## 2015-09-26 DIAGNOSIS — Z7952 Long term (current) use of systemic steroids: Secondary | ICD-10-CM | POA: Insufficient documentation

## 2015-09-26 DIAGNOSIS — E669 Obesity, unspecified: Secondary | ICD-10-CM | POA: Diagnosis not present

## 2015-09-26 DIAGNOSIS — I10 Essential (primary) hypertension: Secondary | ICD-10-CM | POA: Diagnosis not present

## 2015-09-26 DIAGNOSIS — M25511 Pain in right shoulder: Secondary | ICD-10-CM | POA: Diagnosis not present

## 2015-09-26 DIAGNOSIS — R202 Paresthesia of skin: Secondary | ICD-10-CM | POA: Diagnosis not present

## 2015-09-26 DIAGNOSIS — R079 Chest pain, unspecified: Secondary | ICD-10-CM | POA: Diagnosis not present

## 2015-09-26 DIAGNOSIS — M25551 Pain in right hip: Secondary | ICD-10-CM | POA: Insufficient documentation

## 2015-09-26 DIAGNOSIS — M25561 Pain in right knee: Secondary | ICD-10-CM

## 2015-09-26 DIAGNOSIS — M545 Low back pain: Secondary | ICD-10-CM | POA: Diagnosis not present

## 2015-09-26 DIAGNOSIS — F319 Bipolar disorder, unspecified: Secondary | ICD-10-CM | POA: Insufficient documentation

## 2015-09-26 DIAGNOSIS — Z79899 Other long term (current) drug therapy: Secondary | ICD-10-CM | POA: Insufficient documentation

## 2015-09-26 DIAGNOSIS — M25552 Pain in left hip: Secondary | ICD-10-CM | POA: Insufficient documentation

## 2015-09-26 DIAGNOSIS — E119 Type 2 diabetes mellitus without complications: Secondary | ICD-10-CM | POA: Insufficient documentation

## 2015-09-26 LAB — BASIC METABOLIC PANEL
Anion gap: 9 (ref 5–15)
BUN: 6 mg/dL (ref 6–20)
CO2: 27 mmol/L (ref 22–32)
Calcium: 8.4 mg/dL — ABNORMAL LOW (ref 8.9–10.3)
Chloride: 101 mmol/L (ref 101–111)
Creatinine, Ser: 0.7 mg/dL (ref 0.44–1.00)
GFR calc Af Amer: >60 mL/min (ref >60)
GFR calc non Af Amer: >60 mL/min (ref >60)
Glucose, Bld: 320 mg/dL — ABNORMAL HIGH (ref 65–99)
Potassium: 4.2 mmol/L (ref 3.5–5.1)
Sodium: 137 mmol/L (ref 135–145)

## 2015-09-26 LAB — I-STAT TROPONIN, ED: Troponin i, poc: 0 ng/mL (ref 0.00–0.08)

## 2015-09-26 LAB — CBC
HCT: 35.3 % — ABNORMAL LOW (ref 36.0–46.0)
Hemoglobin: 10.9 g/dL — ABNORMAL LOW (ref 12.0–15.0)
MCH: 23 pg — ABNORMAL LOW (ref 26.0–34.0)
MCHC: 30.9 g/dL (ref 30.0–36.0)
MCV: 74.5 fL — ABNORMAL LOW (ref 78.0–100.0)
Platelets: 276 10*3/uL (ref 150–400)
RBC: 4.74 MIL/uL (ref 3.87–5.11)
RDW: 13.8 % (ref 11.5–15.5)
WBC: 8.2 10*3/uL (ref 4.0–10.5)

## 2015-09-26 MED ORDER — METHOCARBAMOL 500 MG PO TABS: 500.0000 mg | ORAL_TABLET | Freq: Two times a day (BID) | ORAL | Status: DC

## 2015-09-26 NOTE — ED Notes (Signed)
Pt. havinag bilateral hip pain.  Rt. Hip is worse than her left and it is putting pressure on her lumbar area. Also when walking both her knees are hurting. Rt. Shoulder pain radiates into her neck.  She also reports having chest pain, began 4 days ago.  and also having blood in her mouth intermittently when she wakes up.  She stated, "I can smell my blood every day. :  Skin is warm and dry.

## 2015-09-26 NOTE — ED Notes (Signed)
Pt. Did not report having chest pain until she was placed in Triage Room.  She reports having chest pain for 3 days. ECG completed in Triage

## 2015-09-26 NOTE — ED Notes (Signed)
Pt verbalized understanding of d/c instructions and has no further questions. Pt stable and NAD.  

## 2015-09-26 NOTE — Discharge Instructions (Signed)

## 2015-09-26 NOTE — ED Notes (Signed)
Low back /buttock pain radiates to both knees, now having pain across shoulders. Has changed pillows to try to fix pain, has taken motrin 800mg  without relief also.
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
## 2016-05-25 ENCOUNTER — Emergency Department (HOSPITAL_COMMUNITY)
Admission: EM | Admit: 2016-05-25 | Discharge: 2016-05-25 | Disposition: A | Discharge status: Home / Self Care | Payer: Medicare Other | Attending: Emergency Medicine | Admitting: Emergency Medicine

## 2016-05-25 ENCOUNTER — Encounter (HOSPITAL_COMMUNITY): Payer: Self-pay | Admitting: Emergency Medicine

## 2016-05-25 ENCOUNTER — Emergency Department (HOSPITAL_COMMUNITY): Payer: Medicare Other

## 2016-05-25 DIAGNOSIS — E119 Type 2 diabetes mellitus without complications: Secondary | ICD-10-CM | POA: Diagnosis not present

## 2016-05-25 DIAGNOSIS — R0789 Other chest pain: Secondary | ICD-10-CM

## 2016-05-25 DIAGNOSIS — I10 Essential (primary) hypertension: Secondary | ICD-10-CM | POA: Diagnosis not present

## 2016-05-25 DIAGNOSIS — R072 Precordial pain: Secondary | ICD-10-CM | POA: Diagnosis present

## 2016-05-25 LAB — I-STAT TROPONIN, ED: Troponin i, poc: 0 ng/mL (ref 0.00–0.08)

## 2016-05-25 LAB — BASIC METABOLIC PANEL
Anion gap: 5 (ref 5–15)
BUN: 12 mg/dL (ref 6–20)
CO2: 28 mmol/L (ref 22–32)
Calcium: 8.5 mg/dL — ABNORMAL LOW (ref 8.9–10.3)
Chloride: 106 mmol/L (ref 101–111)
Creatinine, Ser: 0.87 mg/dL (ref 0.44–1.00)
GFR calc Af Amer: >60 mL/min (ref >60)
GFR calc non Af Amer: >60 mL/min (ref >60)
Glucose, Bld: 159 mg/dL — ABNORMAL HIGH (ref 65–99)
Potassium: 4.1 mmol/L (ref 3.5–5.1)
Sodium: 139 mmol/L (ref 135–145)

## 2016-05-25 LAB — CBC
HCT: 34.5 % — ABNORMAL LOW (ref 36.0–46.0)
Hemoglobin: 10.7 g/dL — ABNORMAL LOW (ref 12.0–15.0)
MCH: 22.9 pg — ABNORMAL LOW (ref 26.0–34.0)
MCHC: 31 g/dL (ref 30.0–36.0)
MCV: 73.7 fL — ABNORMAL LOW (ref 78.0–100.0)
Platelets: 283 10*3/uL (ref 150–400)
RBC: 4.68 MIL/uL (ref 3.87–5.11)
RDW: 14.6 % (ref 11.5–15.5)
WBC: 7.9 10*3/uL (ref 4.0–10.5)

## 2016-05-25 MED ORDER — KETOROLAC TROMETHAMINE 60 MG/2ML IM SOLN
60.0000 mg | Freq: Once | INTRAMUSCULAR | Status: AC
  Administered 2016-05-25: 60 mg via INTRAMUSCULAR
  Filled 2016-05-25: qty 2

## 2016-05-25 MED ORDER — NAPROXEN 500 MG PO TABS: 500.0000 mg | ORAL_TABLET | Freq: Two times a day (BID) | ORAL | 0 refills | Status: DC

## 2016-05-25 MED ORDER — DICLOFENAC 18 MG PO CAPS: 18.0000 mg | ORAL_CAPSULE | Freq: Three times a day (TID) | ORAL | 0 refills | Status: DC | PRN

## 2016-05-25 NOTE — ED Provider Notes (Signed)
[facility]-EMERGENCY DEPT Provider Note   CSN: [id number] Arrival date & time: 05/25/16  1437     History   Chief Complaint Chief Complaint  Patient presents with  . Chest Pain  . Rash    HPI Emily Cochran is a 55 y.o. female.  HPI Emily Cochran is a 55 y.o. female with PMH significant for HTN, DM, and Bipolar disorder who presents with 7 day history of gradual onset, constant, unchanging, sharp, substernal chest pain that radiates to the back and upper arms.  No fever, chills, cough, DOE, lower extremity edema, unilateral edema, hx of DVT/PE, recent surgery/immobilization, exogenous estrogen use, N/V/D, or abdominal pain.  She has not taken anything for her symptoms. Nothing makes it worse.  It is not exertional.  It is not positional.  No cardiac history.  Positive family history, but over the age of 47. She does not smoke.  She also complains of bilateral hand itching over the past 4-5 months for which she has seen her PCP multiple times, she states she thought she'd mention it because she is here.  Her main concern is her CP today.  Past Medical History:  Diagnosis Date  . Bipolar 1 disorder (HCC)   . Diabetes mellitus without complication (HCC)   . Hypertension     There are no active problems to display for this patient.   Past Surgical History:  Procedure Laterality Date  . ABDOMINAL HYSTERECTOMY      OB History    No data available       Home Medications    Prior to Admission medications   Medication Sig Start Date End Date Taking? Authorizing Provider  albuterol (PROVENTIL HFA;VENTOLIN HFA) 108 (90 BASE) MCG/ACT inhaler Inhale 1 puff into the lungs every 6 (six) hours as needed for wheezing or shortness of breath.   Yes Historical Provider, MD  QUEtiapine (SEROQUEL XR) 400 MG 24 hr tablet Take 400 mg by mouth at bedtime.   Yes Historical Provider, MD  Diclofenac 18 MG CAPS Take 18 mg by mouth 3 (three) times daily as needed. 05/25/16   Cheri Fowler, PA-C    HYDROcodone-acetaminophen (NORCO/VICODIN) 5-325 MG per tablet Take 1 tablet by mouth every 6 (six) hours as needed for moderate pain. Patient not taking: Reported on 05/25/2016 02/10/14   Charlestine Night, PA-C  methocarbamol (ROBAXIN) 500 MG tablet Take 1 tablet (500 mg total) by mouth 2 (two) times daily. Patient not taking: Reported on 05/25/2016 09/26/15   Fayrene Helper, PA-C  oxyCODONE-acetaminophen (PERCOCET/ROXICET) 5-325 MG tablet Take 1 tablet by mouth every 4 (four) hours as needed for severe pain. Patient not taking: Reported on 05/25/2016 08/25/15   Shawn C Joy, PA-C  predniSONE (DELTASONE) 50 MG tablet Take 1 tablet (50 mg total) by mouth daily. Patient not taking: Reported on 05/25/2016 02/10/14   Charlestine Night, PA-C    Family History No family history on file.  Social History Social History  Substance Use Topics  . Smoking status: Never Smoker  . Smokeless tobacco: Never Used  . Alcohol use No     Allergies   Coconut flavor; Shellfish allergy; Ultram [tramadol]; Aspirin; and Eggs or egg-derived products   Review of Systems Review of Systems All other systems negative unless otherwise stated in HPI   Physical Exam Updated Vital Signs BP 125/85   Pulse 66   Temp 98.1 F (36.7 C) (Oral)   Resp 17   Ht 5\' 2"  (1.575 m)   Wt 9.072 kg  SpO2 100%   BMI 3.66 kg/m   Physical Exam  Constitutional: She is oriented to person, place, and time. She appears well-developed and well-nourished.  Non-toxic appearance. She does not have a sickly appearance. She does not appear ill.  HENT:  Head: Normocephalic and atraumatic.  Mouth/Throat: Oropharynx is clear and moist.  Eyes: Conjunctivae are normal. Pupils are equal, round, and reactive to light.  Neck: Normal range of motion. Neck supple.  Cardiovascular: Normal rate, regular rhythm and normal heart sounds.  Exam reveals no gallop.   No murmur heard. Pulses:      Radial pulses are 2+ on the right side, and 2+ on  the left side.  Pulmonary/Chest: Effort normal and breath sounds normal. No accessory muscle usage or stridor. No respiratory distress. She has no wheezes. She has no rhonchi. She has no rales. She exhibits tenderness.  Chest pain reproducible with palpation.   Abdominal: Soft. Bowel sounds are normal. She exhibits no distension. There is no tenderness.  Musculoskeletal: Normal range of motion.  Lymphadenopathy:    She has no cervical adenopathy.  Neurological: She is alert and oriented to person, place, and time.  Speech clear without dysarthria.  Skin: Skin is warm and dry.  No interdigital burrows, bites, or signs of infection.  Mild excoriations.  Psychiatric: She has a normal mood and affect. Her behavior is normal.     ED Treatments / Results  Labs (all labs ordered are listed, but only abnormal results are displayed) Labs Reviewed  BASIC METABOLIC PANEL - Abnormal; Notable for the following:       Result Value   Glucose, Bld 159 (*)    Calcium 8.5 (*)    All other components within normal limits  CBC - Abnormal; Notable for the following:    Hemoglobin 10.7 (*)    HCT 34.5 (*)    MCV 73.7 (*)    MCH 22.9 (*)    All other components within normal limits  I-STAT TROPOININ, ED    EKG  EKG Interpretation  Date/Time:  Tuesday May 25 2016 14:47:00 EST Ventricular Rate:  70 PR Interval:  136 QRS Duration: 68 QT Interval:  420 QTC Calculation: 453 R Axis:   16 Text Interpretation:  Normal sinus rhythm Normal ECG Confirmed by KNOTT MD, DANIEL [phone]([id number]) on 05/25/2016 5:18:47 PM       Radiology Dg Chest 2 View  Result Date: 05/25/2016 CLINICAL DATA:  Chest pain beginning 2 days ago. EXAM: CHEST  2 VIEW COMPARISON:  09/26/2015 FINDINGS: The heart size and mediastinal contours are within normal limits. Both lungs are clear. The visualized skeletal structures are unremarkable. IMPRESSION: No active cardiopulmonary disease. Electronically Signed   By: Paulina FusiMark  Shogry M.D.    On: 05/25/2016 15:25    Procedures Procedures (including critical care time)  Medications Ordered in ED Medications  ketorolac (TORADOL) injection 60 mg (60 mg Intramuscular Given 05/25/16 1747)     Initial Impression / Assessment and Plan / ED Course  I have reviewed the triage vital signs and the nursing notes.  Pertinent labs & imaging results that were available during my care of the patient were reviewed by me and considered in my medical decision making (see chart for details).  Clinical Course    Patient presents with findings consistent with chest wall pain.  Vitals are stable.  Chest pain is reproducible with palpation.  No clinical signs of PE.  Labs without acute abnormalities.  Troponin 0 after 7 days of constant  CP, delta troponin is not necessary as there would be a rise in troponin by now.  CP resolved with IM Toradol.  Low clinical suspicion for PE or dissection. Low suspicion ACS, HEAR score 3, atypical presentation.  Patient will be discharged home with diclofenac.  Return precautions discussed including worsening pain, shortness of breath, and syncope.  Follow up with PCP.  Discussed PCP follow up for dermatology referral for persistent itching. No signs of overlying infection or infestation.  Discussed symptomatic treatment.  Patient is stable for discharge.   Final Clinical Impressions(s) / ED Diagnoses   Final diagnoses:  Chest wall pain    New Prescriptions New Prescriptions   DICLOFENAC 18 MG CAPS    Take 18 mg by mouth 3 (three) times daily as needed.     Cheri FowlerKayla Harkirat Orozco, PA-C 05/25/16 1819    Lyndal Pulleyaniel Knott, MD 05/26/16 [phone]

## 2016-05-25 NOTE — Discharge Instructions (Signed)
You may take Benadryl for your itching.  Take Naproxen twice daily for pain.  Follow up with your primary care physician in 1 week.  Return to the ED for worsening pain, shortness of breath, fever, or any new or concerning symptoms.

## 2016-05-25 NOTE — ED Triage Notes (Signed)
Pt here with c/o chest pain that started 2 days ago, midsternal, radiates around bra line to back and into both shoulders and arms.  Also c/o itching on both hands-- darkened areas on skin, with dry patches. Has been getting worse.

## 2016-06-24 ENCOUNTER — Encounter (HOSPITAL_COMMUNITY): Payer: Self-pay

## 2016-06-24 ENCOUNTER — Emergency Department (HOSPITAL_COMMUNITY)
Admission: EM | Admit: 2016-06-24 | Discharge: 2016-06-24 | Disposition: A | Discharge status: Home / Self Care | Payer: Medicare Other | Attending: Emergency Medicine | Admitting: Emergency Medicine

## 2016-06-24 DIAGNOSIS — R55 Syncope and collapse: Secondary | ICD-10-CM | POA: Diagnosis not present

## 2016-06-24 DIAGNOSIS — J45909 Unspecified asthma, uncomplicated: Secondary | ICD-10-CM | POA: Insufficient documentation

## 2016-06-24 DIAGNOSIS — I10 Essential (primary) hypertension: Secondary | ICD-10-CM | POA: Diagnosis not present

## 2016-06-24 HISTORY — DX: Calculus of kidney: N20.0

## 2016-06-24 HISTORY — DX: Unspecified asthma, uncomplicated: J45.909

## 2016-06-24 LAB — URINALYSIS, ROUTINE W REFLEX MICROSCOPIC
Bilirubin Urine: NEGATIVE
Glucose, UA: NEGATIVE mg/dL
Ketones, ur: NEGATIVE mg/dL
Nitrite: POSITIVE — AB
Protein, ur: 30 mg/dL — AB
Specific Gravity, Urine: 1.026 (ref 1.005–1.030)
pH: 5 (ref 5.0–8.0)

## 2016-06-24 LAB — CBC WITH DIFFERENTIAL/PLATELET
Basophils Absolute: 0 10*3/uL (ref 0.0–0.1)
Basophils Relative: 0 %
Eosinophils Absolute: 0.1 10*3/uL (ref 0.0–0.7)
Eosinophils Relative: 1 %
HCT: 39.4 % (ref 36.0–46.0)
Hemoglobin: 12.2 g/dL (ref 12.0–15.0)
Lymphocytes Relative: 24 %
Lymphs Abs: 1.9 10*3/uL (ref 0.7–4.0)
MCH: 22.7 pg — ABNORMAL LOW (ref 26.0–34.0)
MCHC: 31 g/dL (ref 30.0–36.0)
MCV: 73.4 fL — ABNORMAL LOW (ref 78.0–100.0)
Monocytes Absolute: 0.3 10*3/uL (ref 0.1–1.0)
Monocytes Relative: 4 %
Neutro Abs: 5.6 10*3/uL (ref 1.7–7.7)
Neutrophils Relative %: 71 %
Platelets: 257 10*3/uL (ref 150–400)
RBC: 5.37 MIL/uL — ABNORMAL HIGH (ref 3.87–5.11)
RDW: 15 % (ref 11.5–15.5)
WBC: 7.8 10*3/uL (ref 4.0–10.5)

## 2016-06-24 LAB — BASIC METABOLIC PANEL
Anion gap: 5 (ref 5–15)
BUN: 10 mg/dL (ref 6–20)
CO2: 27 mmol/L (ref 22–32)
Calcium: 8.2 mg/dL — ABNORMAL LOW (ref 8.9–10.3)
Chloride: 108 mmol/L (ref 101–111)
Creatinine, Ser: 0.79 mg/dL (ref 0.44–1.00)
GFR calc Af Amer: >60 mL/min (ref >60)
GFR calc non Af Amer: >60 mL/min (ref >60)
Glucose, Bld: 197 mg/dL — ABNORMAL HIGH (ref 65–99)
Potassium: 3.8 mmol/L (ref 3.5–5.1)
Sodium: 140 mmol/L (ref 135–145)

## 2016-06-24 LAB — CBG MONITORING, ED: Glucose-Capillary: 202 mg/dL — ABNORMAL HIGH (ref 65–99)

## 2016-06-24 MED ORDER — SODIUM CHLORIDE 0.9 % IV BOLUS (SEPSIS)
1000.0000 mL | Freq: Once | INTRAVENOUS | Status: AC
  Administered 2016-06-24: 1000 mL via INTRAVENOUS

## 2016-06-24 MED ORDER — ONDANSETRON HCL 4 MG/2ML IJ SOLN
4.0000 mg | Freq: Once | INTRAMUSCULAR | Status: AC
  Administered 2016-06-24: 4 mg via INTRAVENOUS
  Filled 2016-06-24: qty 2

## 2016-06-24 NOTE — ED Notes (Signed)
Bed: WA19 Expected date:  Expected time:  Means of arrival:  Comments: 

## 2016-06-24 NOTE — Discharge Instructions (Signed)
Return here as needed.  Follow-up with your primary doctor, increase your fluid intake °

## 2016-06-24 NOTE — ED Triage Notes (Addendum)
[place]o syncopal episode while sitting on a bench.  Pt reports that she had [place].  Sts "I started feeling dizzy, sat down, and passed out."  Denies pain.  Pt reports giving plasma this morning.  Syncopal episode was unwitnessed.  A & Ox4.

## 2016-06-24 NOTE — ED Provider Notes (Signed)
[facility]-EMERGENCY DEPT Provider Note   CSN: [id number] Arrival date & time: 06/24/16  1046     History   Chief Complaint Chief Complaint  Patient presents with  . Loss of Consciousness    HPI Emily Cochran is a 56 y.o. female.  HPI Patient presents to the emergency department with syncope after donating plasma.  The patient states that she donated plasma this morning and walked to and from, because of the fact that the bus was not running.  Patient states she Is plasma once a week.  Patient states that she felt dizzy and hot before passing out. The patient denies chest pain, shortness of breath, headache,blurred vision, neck pain, fever, cough, weakness, numbness, dizziness, anorexia, edema, abdominal pain, nausea, vomiting, diarrhea, rash, back pain, dysuria, hematemesis, bloody stool.  Past Medical History:  Diagnosis Date  . Asthma   . Bipolar 1 disorder (HCC)   . Hypertension   . Kidney stones     There are no active problems to display for this patient.   Past Surgical History:  Procedure Laterality Date  . ABDOMINAL HYSTERECTOMY    . APPENDECTOMY    . CHOLECYSTECTOMY      OB History    No data available       Home Medications    Prior to Admission medications   Medication Sig Start Date End Date Taking? Authorizing Provider  pregabalin (LYRICA) 75 MG capsule Take 75 mg by mouth 2 (two) times daily.   Yes Historical Provider, MD  albuterol (PROVENTIL HFA;VENTOLIN HFA) 108 (90 BASE) MCG/ACT inhaler Inhale 1 puff into the lungs every 6 (six) hours as needed for wheezing or shortness of breath.    Historical Provider, MD  Diclofenac 18 MG CAPS Take 18 mg by mouth 3 (three) times daily as needed. Patient taking differently: Take 18 mg by mouth 3 (three) times daily as needed (pain).  05/25/16   Cheri FowlerKayla Rose, PA-C  HYDROcodone-acetaminophen (NORCO/VICODIN) 5-325 MG per tablet Take 1 tablet by mouth every 6 (six) hours as needed for moderate pain. Patient not taking:  Reported on 05/25/2016 02/10/14   Charlestine Nighthristopher Damondre Pfeifle, PA-C  methocarbamol (ROBAXIN) 500 MG tablet Take 1 tablet (500 mg total) by mouth 2 (two) times daily. Patient not taking: Reported on 05/25/2016 09/26/15   Fayrene HelperBowie Tran, PA-C  oxyCODONE-acetaminophen (PERCOCET/ROXICET) 5-325 MG tablet Take 1 tablet by mouth every 4 (four) hours as needed for severe pain. Patient not taking: Reported on 05/25/2016 08/25/15   Shawn C Joy, PA-C  predniSONE (DELTASONE) 50 MG tablet Take 1 tablet (50 mg total) by mouth daily. Patient not taking: Reported on 05/25/2016 02/10/14   Charlestine Nighthristopher Aneliese Beaudry, PA-C  QUEtiapine (SEROQUEL XR) 400 MG 24 hr tablet Take 400 mg by mouth at bedtime.    Historical Provider, MD    Family History History reviewed. No pertinent family history.  Social History Social History  Substance Use Topics  . Smoking status: Never Smoker  . Smokeless tobacco: Never Used  . Alcohol use No     Allergies   Coconut flavor; Shellfish allergy; Ultram [tramadol]; Aspirin; and Eggs or egg-derived products   Review of Systems Review of Systems All other systems negative except as documented in the HPI. All pertinent positives and negatives as reviewed in the HPI.  Physical Exam Updated Vital Signs BP 131/76 (BP Location: Left Arm)   Pulse 72   Temp 97.8 F (36.6 C) (Oral)   Resp 15   Ht 5\' 2"  (1.575 m)   Wt  91.6 kg   SpO2 100%   BMI 36.95 kg/m   Physical Exam  Constitutional: She is oriented to person, place, and time. She appears well-developed and well-nourished. No distress.  HENT:  Head: Normocephalic and atraumatic.  Mouth/Throat: Oropharynx is clear and moist.  Eyes: Pupils are equal, round, and reactive to light.  Neck: Normal range of motion. Neck supple.  Cardiovascular: Normal rate, regular rhythm and normal heart sounds.  Exam reveals no gallop and no friction rub.   No murmur heard. Pulmonary/Chest: Effort normal and breath sounds normal. No respiratory distress. She has  no wheezes.  Abdominal: Soft. Bowel sounds are normal. She exhibits no distension. There is no tenderness.  Neurological: She is alert and oriented to person, place, and time. She exhibits normal muscle tone. Coordination normal.  Skin: Skin is warm and dry. Capillary refill takes less than 2 seconds. No rash noted. No erythema.  Psychiatric: She has a normal mood and affect. Her behavior is normal.  Nursing note and vitals reviewed.    ED Treatments / Results  Labs (all labs ordered are listed, but only abnormal results are displayed) Labs Reviewed  BASIC METABOLIC PANEL - Abnormal; Notable for the following:       Result Value   Glucose, Bld 197 (*)    Calcium 8.2 (*)    All other components within normal limits  CBC WITH DIFFERENTIAL/PLATELET - Abnormal; Notable for the following:    RBC 5.37 (*)    MCV 73.4 (*)    MCH 22.7 (*)    All other components within normal limits  URINALYSIS, ROUTINE W REFLEX MICROSCOPIC - Abnormal; Notable for the following:    Color, Urine AMBER (*)    APPearance CLOUDY (*)    Hgb urine dipstick SMALL (*)    Protein, ur 30 (*)    Nitrite POSITIVE (*)    Leukocytes, UA TRACE (*)    Bacteria, UA MANY (*)    Squamous Epithelial / LPF 0-5 (*)    All other components within normal limits  CBG MONITORING, ED - Abnormal; Notable for the following:    Glucose-Capillary 202 (*)    All other components within normal limits  CBG MONITORING, ED    EKG  EKG Interpretation  Date/Time:  Thursday June 24 2016 11:07:10 EST Ventricular Rate:  79 PR Interval:    QRS Duration: 77 QT Interval:  416 QTC Calculation: 477 R Axis:   32 Text Interpretation:  Sinus rhythm No acute changes No significant change since last tracing Confirmed by Rhunette Croft, MD, Janey Genta ([id number]) on 06/24/2016 12:20:01 PM       Radiology No results found.  Procedures Procedures (including critical care time)  Medications Ordered in ED Medications  sodium chloride 0.9 % bolus  1,000 mL (1,000 mLs Intravenous New Bag/Given 06/24/16 1218)  ondansetron (ZOFRAN) injection 4 mg (4 mg Intravenous Given 06/24/16 1218)     Initial Impression / Assessment and Plan / ED Course  I have reviewed the triage vital signs and the nursing notes.  Pertinent labs & imaging results that were available during my care of the patient were reviewed by me and considered in my medical decision making (see chart for details).   patient passed out from plasma donation and having to walk great distance afterwards.  The patient is stable here in the emergency department was given IV fluids and something to eat.  Patient is advised to return here as needed  Vitals:   06/24/16 1200 06/24/16 1230  06/24/16 1300 06/24/16 1322  BP: 113/69 127/85 131/76 131/76  Pulse: 81 70 73 72  Resp: 17 16 19 15   Temp:    97.8 F (36.6 C)  TempSrc:    Oral  SpO2: 100% 100% 100% 100%  Weight:      Height:       Final Clinical Impressions(s) / ED Diagnoses   Final diagnoses:  None    New Prescriptions New Prescriptions   No medications on file     Charlestine Night, PA-C 06/27/16 1625    Derwood Kaplan, MD 06/27/16 1627

## 2016-07-12 ENCOUNTER — Emergency Department (HOSPITAL_COMMUNITY)
Admission: EM | Admit: 2016-07-12 | Discharge: 2016-07-12 | Disposition: A | Discharge status: Home / Self Care | Payer: Medicare Other | Attending: Emergency Medicine | Admitting: Emergency Medicine

## 2016-07-12 ENCOUNTER — Encounter (HOSPITAL_COMMUNITY): Payer: Self-pay | Admitting: *Deleted

## 2016-07-12 DIAGNOSIS — R69 Illness, unspecified: Secondary | ICD-10-CM

## 2016-07-12 DIAGNOSIS — J45909 Unspecified asthma, uncomplicated: Secondary | ICD-10-CM | POA: Insufficient documentation

## 2016-07-12 DIAGNOSIS — Z79899 Other long term (current) drug therapy: Secondary | ICD-10-CM | POA: Diagnosis not present

## 2016-07-12 DIAGNOSIS — J111 Influenza due to unidentified influenza virus with other respiratory manifestations: Secondary | ICD-10-CM | POA: Diagnosis not present

## 2016-07-12 DIAGNOSIS — I10 Essential (primary) hypertension: Secondary | ICD-10-CM | POA: Insufficient documentation

## 2016-07-12 DIAGNOSIS — R05 Cough: Secondary | ICD-10-CM | POA: Diagnosis present

## 2016-07-12 MED ORDER — ACETAMINOPHEN 500 MG PO TABS
1000.0000 mg | ORAL_TABLET | Freq: Once | ORAL | Status: AC
  Administered 2016-07-12: 1000 mg via ORAL
  Filled 2016-07-12: qty 2

## 2016-07-12 MED ORDER — BUPIVACAINE HCL (PF) 0.5 % IJ SOLN
10.0000 mL | Freq: Once | INTRAMUSCULAR | Status: AC
  Administered 2016-07-12: 10 mL
  Filled 2016-07-12: qty 30

## 2016-07-12 MED ORDER — KETOROLAC TROMETHAMINE 30 MG/ML IJ SOLN
15.0000 mg | Freq: Once | INTRAMUSCULAR | Status: AC
  Administered 2016-07-12: 15 mg via INTRAVENOUS
  Filled 2016-07-12: qty 1

## 2016-07-12 MED ORDER — SODIUM CHLORIDE 0.9 % IV BOLUS (SEPSIS)
1000.0000 mL | Freq: Once | INTRAVENOUS | Status: AC
  Administered 2016-07-12: 1000 mL via INTRAVENOUS

## 2016-07-12 NOTE — ED Triage Notes (Signed)
Pt reports flu lie symptoms x 2 days, sts generalized body aches, cough, generalized weakness, chills. Pt is afebrile in triage.

## 2016-07-12 NOTE — ED Provider Notes (Signed)
[facility]-EMERGENCY DEPT Provider Note   CSN: [id number] Arrival date & time: 07/12/16  1012     History   Chief Complaint Chief Complaint  Patient presents with  . flu like symptoms    HPI Emily Cochran is a 56 y.o. female.  The history is provided by the patient.  Cough  This is a new problem. The current episode started 2 days ago. The problem occurs constantly. The problem has not changed since onset.The cough is non-productive. The maximum temperature recorded prior to her arrival was 102 to 102.9 F. Fever duration: yesterday, resolved. Associated symptoms include chills and myalgias. She has tried nothing for the symptoms. The treatment provided no relief. She is not a smoker.    Past Medical History:  Diagnosis Date  . Asthma   . Bipolar 1 disorder (HCC)   . Hypertension   . Kidney stones     There are no active problems to display for this patient.   Past Surgical History:  Procedure Laterality Date  . ABDOMINAL HYSTERECTOMY    . APPENDECTOMY    . CHOLECYSTECTOMY      OB History    No data available       Home Medications    Prior to Admission medications   Medication Sig Start Date End Date Taking? Authorizing Provider  albuterol (PROVENTIL HFA;VENTOLIN HFA) 108 (90 BASE) MCG/ACT inhaler Inhale 1 puff into the lungs every 6 (six) hours as needed for wheezing or shortness of breath.   Yes Historical Provider, MD  Diphenhydramine-APAP, sleep, (GOODY PM PO) Take 1 Package by mouth as needed (pain).   Yes Historical Provider, MD  guaiFENesin-dextromethorphan (ROBITUSSIN DM) 100-10 MG/5ML syrup Take 15 mLs by mouth every 4 (four) hours as needed for cough.   Yes Historical Provider, MD  pregabalin (LYRICA) 75 MG capsule Take 75 mg by mouth 2 (two) times daily.   Yes Historical Provider, MD  QUEtiapine (SEROQUEL XR) 400 MG 24 hr tablet Take 400 mg by mouth at bedtime.   Yes Historical Provider, MD  Diclofenac 18 MG CAPS Take 18 mg by mouth 3 (three) times daily  as needed. Patient taking differently: Take 18 mg by mouth 3 (three) times daily as needed (pain).  05/25/16   Cheri FowlerKayla Rose, PA-C  HYDROcodone-acetaminophen (NORCO/VICODIN) 5-325 MG per tablet Take 1 tablet by mouth every 6 (six) hours as needed for moderate pain. Patient not taking: Reported on 05/25/2016 02/10/14   Charlestine Nighthristopher Lawyer, PA-C  methocarbamol (ROBAXIN) 500 MG tablet Take 1 tablet (500 mg total) by mouth 2 (two) times daily. Patient not taking: Reported on 05/25/2016 09/26/15   Fayrene HelperBowie Tran, PA-C  oxyCODONE-acetaminophen (PERCOCET/ROXICET) 5-325 MG tablet Take 1 tablet by mouth every 4 (four) hours as needed for severe pain. Patient not taking: Reported on 05/25/2016 08/25/15   Shawn C Joy, PA-C  predniSONE (DELTASONE) 50 MG tablet Take 1 tablet (50 mg total) by mouth daily. Patient not taking: Reported on 05/25/2016 02/10/14   Charlestine Nighthristopher Lawyer, PA-C    Family History No family history on file.  Social History Social History  Substance Use Topics  . Smoking status: Never Smoker  . Smokeless tobacco: Never Used  . Alcohol use No     Allergies   Coconut flavor; Shellfish allergy; Ultram [tramadol]; Aspirin; and Eggs or egg-derived products   Review of Systems Review of Systems  Constitutional: Positive for chills.  Respiratory: Positive for cough.   Musculoskeletal: Positive for myalgias.  All other systems reviewed and are negative.  Physical Exam Updated Vital Signs BP 147/65   Pulse 90   Temp 98.2 F (36.8 C) (Oral)   Resp 18   SpO2 96%   Physical Exam  Constitutional: She is oriented to person, place, and time. She appears well-developed and well-nourished. No distress.  HENT:  Head: Normocephalic.  Nose: Nose normal.  Eyes: Conjunctivae are normal.  Neck: Neck supple. No tracheal deviation present.  Cardiovascular: Normal rate, regular rhythm and normal heart sounds.   Pulmonary/Chest: Effort normal and breath sounds normal. No respiratory distress.    Abdominal: Soft. She exhibits no distension. There is no tenderness.  Musculoskeletal:       Cervical back: She exhibits tenderness and pain. She exhibits normal range of motion.       Back:  Neurological: She is alert and oriented to person, place, and time.  Skin: Skin is warm and dry. No rash noted.  Psychiatric: She has a normal mood and affect.  Vitals reviewed.    ED Treatments / Results  Labs (all labs ordered are listed, but only abnormal results are displayed) Labs Reviewed - No data to display  EKG  EKG Interpretation None       Radiology No results found.  Procedures Procedures (including critical care time)  Procedure Note: Trigger Point Injection for Myofascial pain  Performed by Dr. Clydene Pugh Indication: muscle/myofascial pain Muscle body and tendon sheath of the left superior trapezius muscle(s) were injected with 0.5% bupivacaine under sterile technique for release of muscle spasm/pain. Patient tolerated well with immediate improvement of symptoms and no immediate complications following procedure.  CPT Code:   1 or 2 muscle bodies: 20552   Medications Ordered in ED Medications  sodium chloride 0.9 % bolus 1,000 mL (0 mLs Intravenous Stopped 07/12/16 1623)  ketorolac (TORADOL) 30 MG/ML injection 15 mg (15 mg Intravenous Given 07/12/16 1507)  acetaminophen (TYLENOL) tablet 1,000 mg (1,000 mg Oral Given 07/12/16 1440)  bupivacaine (MARCAINE) 0.5 % injection 10 mL (10 mLs Infiltration Given by Other 07/12/16 1537)     Initial Impression / Assessment and Plan / ED Course  I have reviewed the triage vital signs and the nursing notes.  Pertinent labs & imaging results that were available during my care of the patient were reviewed by me and considered in my medical decision making (see chart for details).     56 y.o. female presents with flu-like illness over last 2 days. Not high risk demographic. Secondary complaint of MSK pain or left neck which appears d/t  muscle spasm and local anaesthesia applied. Supportive care measures here. Plan for continued supportive care at home. AFVSS throughout ED visit. No indication for further emergent testing at this time. Plan to follow up with PCP as needed and return precautions discussed for worsening or new concerning symptoms.   Final Clinical Impressions(s) / ED Diagnoses   Final diagnoses:  Influenza-like illness    New Prescriptions Discharge Medication List as of 07/12/2016  4:10 PM       Lyndal Pulley, MD 07/12/16 [phone]

## 2016-08-13 ENCOUNTER — Emergency Department (HOSPITAL_COMMUNITY): Payer: Medicare Other

## 2016-08-13 ENCOUNTER — Emergency Department (HOSPITAL_COMMUNITY)
Admission: EM | Admit: 2016-08-13 | Discharge: 2016-08-13 | Disposition: A | Discharge status: Home / Self Care | Payer: Medicare Other | Attending: Emergency Medicine | Admitting: Emergency Medicine

## 2016-08-13 ENCOUNTER — Encounter (HOSPITAL_COMMUNITY): Payer: Self-pay

## 2016-08-13 DIAGNOSIS — J069 Acute upper respiratory infection, unspecified: Secondary | ICD-10-CM | POA: Diagnosis not present

## 2016-08-13 DIAGNOSIS — B9789 Other viral agents as the cause of diseases classified elsewhere: Secondary | ICD-10-CM

## 2016-08-13 DIAGNOSIS — Z79899 Other long term (current) drug therapy: Secondary | ICD-10-CM | POA: Insufficient documentation

## 2016-08-13 DIAGNOSIS — I10 Essential (primary) hypertension: Secondary | ICD-10-CM | POA: Insufficient documentation

## 2016-08-13 DIAGNOSIS — J45909 Unspecified asthma, uncomplicated: Secondary | ICD-10-CM | POA: Insufficient documentation

## 2016-08-13 DIAGNOSIS — R05 Cough: Secondary | ICD-10-CM | POA: Diagnosis present

## 2016-08-13 LAB — RAPID STREP SCREEN (MED CTR MEBANE ONLY): Streptococcus, Group A Screen (Direct): NEGATIVE

## 2016-08-13 MED ORDER — ALBUTEROL SULFATE HFA 108 (90 BASE) MCG/ACT IN AERS: 1.0000 | INHALATION_SPRAY | Freq: Four times a day (QID) | RESPIRATORY_TRACT | 0 refills | Status: DC | PRN

## 2016-08-13 MED ORDER — IPRATROPIUM-ALBUTEROL 0.5-2.5 (3) MG/3ML IN SOLN
3.0000 mL | Freq: Once | RESPIRATORY_TRACT | Status: AC
  Administered 2016-08-13: 3 mL via RESPIRATORY_TRACT
  Filled 2016-08-13: qty 3

## 2016-08-13 MED ORDER — PREDNISONE 20 MG PO TABS
40.0000 mg | ORAL_TABLET | Freq: Once | ORAL | Status: AC
  Administered 2016-08-13: 40 mg via ORAL
  Filled 2016-08-13: qty 2

## 2016-08-13 MED ORDER — PREDNISONE 20 MG PO TABS: 40.0000 mg | ORAL_TABLET | Freq: Every day | ORAL | 0 refills | Status: DC

## 2016-08-13 MED ORDER — FLUTICASONE PROPIONATE 50 MCG/ACT NA SUSP: 2.0000 | Freq: Every day | NASAL | 12 refills | Status: AC

## 2016-08-13 MED ORDER — ACETAMINOPHEN 500 MG PO TABS
500.0000 mg | ORAL_TABLET | Freq: Once | ORAL | Status: AC
  Administered 2016-08-13: 500 mg via ORAL
  Filled 2016-08-13: qty 1

## 2016-08-13 MED ORDER — GUAIFENESIN-CODEINE 100-10 MG/5ML PO SOLN: 5.0000 mL | Freq: Three times a day (TID) | ORAL | 0 refills | Status: DC | PRN

## 2016-08-13 MED ORDER — BENZONATATE 100 MG PO CAPS
100.0000 mg | ORAL_CAPSULE | Freq: Once | ORAL | Status: AC
  Administered 2016-08-13: 100 mg via ORAL
  Filled 2016-08-13: qty 1

## 2016-08-13 NOTE — ED Triage Notes (Signed)
Pt reports productive cough, congestion, runny nose, ear aches, sore throat and body aches; ongoing for 4 days

## 2016-08-13 NOTE — ED Provider Notes (Signed)
[facility]-EMERGENCY DEPT Provider Note   CSN: [id number] Arrival date & time: 08/13/16  1212  By signing my name below, I, Marnette Burgess Long, attest that this documentation has been prepared under the direction and in the presence of [employer], PA-C. Electronically Signed: Marnette Burgess Long, Scribe. 08/13/2016. 1:08 PM.  History   Chief Complaint Chief Complaint  Patient presents with  . Cough   The history is provided by the patient and medical records. No language interpreter was used.    HPI Comments:  Emily Cochran is an obese 56 y.o. female with a PMHx of HTN, Renal Calculi, Bipolar 1 Disorder, and Asthma, who presents to the Emergency Department complaining of persistent, productive cough onset three days ago. She reports the cough arising four days ago with associated congestion, rhinorrhea, bilateral otalgia, sore throat, unmeasured fever, and generalized myalgias arising s/p onset of the cough. Pt reports a past h/o bronchitis which today's symptoms feel similar too.. She tried Theraflu at home last night with no relief of her symptoms. She states sick contact with similar symptoms by her grandson. Pt denies any other complaints at this time. Pt did not receive a seasonal influenza vaccination. Pt does not smoke. Denies any n/v/d, urinary symptoms, abd pain or any other associated symptoms.    Past Medical History:  Diagnosis Date  . Asthma   . Bipolar 1 disorder (HCC)   . Hypertension   . Kidney stones     There are no active problems to display for this patient.   Past Surgical History:  Procedure Laterality Date  . ABDOMINAL HYSTERECTOMY    . APPENDECTOMY    . CHOLECYSTECTOMY      OB History    No data available       Home Medications    Prior to Admission medications   Medication Sig Start Date End Date Taking? Authorizing Provider  albuterol (PROVENTIL HFA;VENTOLIN HFA) 108 (90 BASE) MCG/ACT inhaler Inhale 1 puff into the lungs every 6 (six) hours  as needed for wheezing or shortness of breath.    Historical Provider, MD  Diclofenac 18 MG CAPS Take 18 mg by mouth 3 (three) times daily as needed. Patient taking differently: Take 18 mg by mouth 3 (three) times daily as needed (pain).  05/25/16   Kayla Rose, PA-C  Diphenhydramine-APAP, sleep, (GOODY PM PO) Take 1 Package by mouth as needed (pain).    Historical Provider, MD  guaiFENesin-dextromethorphan (ROBITUSSIN DM) 100-10 MG/5ML syrup Take 15 mLs by mouth every 4 (four) hours as needed for cough.    Historical Provider, MD  HYDROcodone-acetaminophen (NORCO/VICODIN) 5-325 MG per tablet Take 1 tablet by mouth every 6 (six) hours as needed for moderate pain. Patient not taking: Reported on 05/25/2016 02/10/14   Charlestine Night, PA-C  methocarbamol (ROBAXIN) 500 MG tablet Take 1 tablet (500 mg total) by mouth 2 (two) times daily. Patient not taking: Reported on 05/25/2016 09/26/15   Fayrene Helper, PA-C  oxyCODONE-acetaminophen (PERCOCET/ROXICET) 5-325 MG tablet Take 1 tablet by mouth every 4 (four) hours as needed for severe pain. Patient not taking: Reported on 05/25/2016 08/25/15   Shawn C Joy, PA-C  predniSONE (DELTASONE) 50 MG tablet Take 1 tablet (50 mg total) by mouth daily. Patient not taking: Reported on 05/25/2016 02/10/14   Charlestine Night, PA-C  pregabalin (LYRICA) 75 MG capsule Take 75 mg by mouth 2 (two) times daily.    Historical Provider, MD  QUEtiapine (SEROQUEL XR) 400 MG 24 hr tablet Take 400 mg  by mouth at bedtime.    Historical Provider, MD    Family History No family history on file.  Social History Social History  Substance Use Topics  . Smoking status: Never Smoker  . Smokeless tobacco: Never Used  . Alcohol use No     Allergies   Coconut flavor; Shellfish allergy; Ultram [tramadol]; Aspirin; and Eggs or egg-derived products   Review of Systems Review of Systems  Constitutional: Positive for fever.  HENT: Positive for congestion, ear pain, rhinorrhea and  sore throat.   Respiratory: Positive for cough.   Musculoskeletal: Positive for myalgias (generalized).  All other systems reviewed and are negative.    Physical Exam Updated Vital Signs BP 150/86 (BP Location: Left Arm)   Pulse 98   Temp 99 F (37.2 C) (Oral)   Resp 18   Ht 5\' 2"  (1.575 m)   Wt 198 lb (89.8 kg)   SpO2 99%   BMI 36.21 kg/m   Physical Exam  Constitutional: She is oriented to person, place, and time. She appears well-developed and well-nourished.  HENT:  Head: Normocephalic.  Right Ear: Tympanic membrane, external ear and ear canal normal.  Left Ear: Tympanic membrane, external ear and ear canal normal.  Nose: Mucosal edema and rhinorrhea present.  Mouth/Throat: Uvula is midline. Posterior oropharyngeal erythema present. No posterior oropharyngeal edema or tonsillar abscesses.  Eyes: Conjunctivae are normal.  Cardiovascular: Normal rate.   Pulmonary/Chest: Effort normal. She has wheezes. She has no rales. She exhibits tenderness.  Faint scattered wheezes. No rhonchi. No chest wall tenderness. Not hypoxic or tycpnic.   Abdominal: She exhibits no distension.  Musculoskeletal: Normal range of motion.  No lower extremity edema/tenderness.  Neurological: She is alert and oriented to person, place, and time.  Skin: Skin is warm and dry.  Psychiatric: She has a normal mood and affect.  Nursing note and vitals reviewed.    ED Treatments / Results  DIAGNOSTIC STUDIES:  Oxygen Saturation is 99% on RA, normal by my interpretation.    COORDINATION OF CARE:  1:07 PM Discussed treatment plan with pt at bedside including CXR, rapid antigen test, breathing Tx, and pt agreed to plan.  Labs (all labs ordered are listed, but only abnormal results are displayed) Labs Reviewed  RAPID STREP SCREEN (NOT AT [facility])  CULTURE, GROUP A STREP [facility])    EKG  EKG Interpretation None       Radiology Dg Chest 2 View  Result Date: 08/13/2016 CLINICAL DATA:  Productive  cough EXAM: CHEST  2 VIEW COMPARISON:  05/25/2016 FINDINGS: The heart size and mediastinal contours are within normal limits. Both lungs are clear. The visualized skeletal structures are unremarkable. IMPRESSION: No active cardiopulmonary disease. Electronically Signed   By: Alcide Clever M.D.   On: 08/13/2016 13:21    Procedures Procedures (including critical care time)  Medications Ordered in ED Medications  benzonatate (TESSALON) capsule 100 mg (100 mg Oral Given 08/13/16 1325)  ipratropium-albuterol (DUONEB) 0.5-2.5 (3) MG/3ML nebulizer solution 3 mL (3 mLs Nebulization Given 08/13/16 1325)  predniSONE (DELTASONE) tablet 40 mg (40 mg Oral Given 08/13/16 1508)  acetaminophen (TYLENOL) tablet 500 mg (500 mg Oral Given 08/13/16 1508)     Initial Impression / Assessment and Plan / ED Course  I have reviewed the triage vital signs and the nursing notes.  Pertinent labs & imaging results that were available during my care of the patient were reviewed by me and considered in my medical decision making (see chart for details).  Pt presents with rhinorrhea, sore throat productive cough, otalgia. Pt CXR negative for acute infiltrate. Negative strep. Pt was given duoneb with improvement in lung sound and pt feels much improved.  Patients symptoms are consistent with URI, likely viral etiology likely bronchitis. Discussed that antibiotics are not indicated for viral infections. Pt will be discharged with albuterol, steroids, cough medicine,  symptomatic treatment.  Clinical presentation is not consistent with pna, pe or acs. Verbalizes understanding and is agreeable with plan. Pt is hemodynamically stable & in NAD prior to dc.   Final Clinical Impressions(s) / ED Diagnoses   Final diagnoses:  Viral URI with cough    New Prescriptions Discharge Medication List as of 08/13/2016  3:12 PM    START taking these medications   Details  fluticasone (FLONASE) 50 MCG/ACT nasal spray Place 2 sprays into  both nostrils daily., Starting Fri 08/13/2016, Print    guaiFENesin-codeine 100-10 MG/5ML syrup Take 5 mLs by mouth 3 (three) times daily as needed for cough., Starting Fri 08/13/2016, Print       I personally performed the services described in this documentation, which was scribed in my presence. The recorded information has been reviewed and is accurate.     Rise MuKenneth T Dekendrick Uzelac, PA-C 08/14/16 2053    Melene Planan Floyd, DO 08/17/16 1427

## 2016-08-13 NOTE — Discharge Instructions (Signed)
Strep test was negative. Chest x-ray shows no signs of pneumonia. This is likely a viral infection that does not require antibiotics. Have given new albuterol inhaler to use. Use the Flonase for nasal decongestion. Take the cough syrup. Will make you drowsy [address] with that. Take the steroids starting tomorrow for 3 days. May take Tylenol for aches and fever. Drink plenty of water. Use over-the-counter Mucinex for chest decongestant. Follow-up with the primary care doctor if her symptoms do not improve in the next 2-3 days. Return to the ED if her symptoms worsen.

## 2016-08-15 LAB — CULTURE, GROUP A STREP (THRC)

## 2016-08-27 ENCOUNTER — Encounter (HOSPITAL_COMMUNITY): Payer: Self-pay | Admitting: *Deleted

## 2016-08-27 ENCOUNTER — Emergency Department (HOSPITAL_COMMUNITY): Payer: Medicare Other

## 2016-08-27 ENCOUNTER — Emergency Department (HOSPITAL_COMMUNITY)
Admission: EM | Admit: 2016-08-27 | Discharge: 2016-08-27 | Disposition: A | Discharge status: Home / Self Care | Payer: Medicare Other | Attending: Emergency Medicine | Admitting: Emergency Medicine

## 2016-08-27 DIAGNOSIS — J029 Acute pharyngitis, unspecified: Secondary | ICD-10-CM | POA: Diagnosis not present

## 2016-08-27 DIAGNOSIS — I10 Essential (primary) hypertension: Secondary | ICD-10-CM | POA: Insufficient documentation

## 2016-08-27 DIAGNOSIS — J302 Other seasonal allergic rhinitis: Secondary | ICD-10-CM

## 2016-08-27 DIAGNOSIS — J069 Acute upper respiratory infection, unspecified: Secondary | ICD-10-CM | POA: Diagnosis not present

## 2016-08-27 DIAGNOSIS — R05 Cough: Secondary | ICD-10-CM | POA: Diagnosis present

## 2016-08-27 DIAGNOSIS — Z79899 Other long term (current) drug therapy: Secondary | ICD-10-CM | POA: Diagnosis not present

## 2016-08-27 DIAGNOSIS — R059 Cough, unspecified: Secondary | ICD-10-CM

## 2016-08-27 LAB — RAPID STREP SCREEN (MED CTR MEBANE ONLY): Streptococcus, Group A Screen (Direct): NEGATIVE

## 2016-08-27 MED ORDER — ALBUTEROL SULFATE (2.5 MG/3ML) 0.083% IN NEBU
5.0000 mg | INHALATION_SOLUTION | Freq: Once | RESPIRATORY_TRACT | Status: AC
  Administered 2016-08-27: 5 mg via RESPIRATORY_TRACT
  Filled 2016-08-27: qty 6

## 2016-08-27 MED ORDER — KETOROLAC TROMETHAMINE 30 MG/ML IJ SOLN
30.0000 mg | Freq: Once | INTRAMUSCULAR | Status: AC
  Administered 2016-08-27: 30 mg via INTRAMUSCULAR
  Filled 2016-08-27: qty 1

## 2016-08-27 MED ORDER — IBUPROFEN 600 MG PO TABS: 600.0000 mg | ORAL_TABLET | Freq: Three times a day (TID) | ORAL | 0 refills | Status: DC | PRN

## 2016-08-27 MED ORDER — BENZONATATE 200 MG PO CAPS: 200.0000 mg | ORAL_CAPSULE | Freq: Three times a day (TID) | ORAL | 0 refills | Status: DC | PRN

## 2016-08-27 MED ORDER — IPRATROPIUM BROMIDE 0.02 % IN SOLN
0.5000 mg | Freq: Once | RESPIRATORY_TRACT | Status: AC
  Administered 2016-08-27: 0.5 mg via RESPIRATORY_TRACT
  Filled 2016-08-27: qty 2.5

## 2016-08-27 MED ORDER — ALBUTEROL SULFATE HFA 108 (90 BASE) MCG/ACT IN AERS: 2.0000 | INHALATION_SPRAY | RESPIRATORY_TRACT | 0 refills | Status: DC | PRN

## 2016-08-27 NOTE — ED Provider Notes (Signed)
[facility]-EMERGENCY DEPT Provider Note   CSN: [id number] Arrival date & time: 08/27/16  1216     History   Chief Complaint Chief Complaint  Patient presents with  . Sore Throat  . Cough  . Headache    HPI Emily Cochran is a 56 y.o. female with a PMHx of bipolar 1 disorder, asthma, HTN, and nephrolithiasis, with a PSHx of appendectomy, hysterectomy, and cholecystectomy, who presents to the ED with complaints of ongoing URI symptoms 6 weeks. Patient states that she's had a cough with greenish yellow sputum production, sore throat, bilateral ear itching and "crackling sounds", generalized body aches, wheezing, and clear rhinorrhea for the last 6 weeks. She states that she has not improved at all over the last 6 weeks, and the symptoms have been constant and unchanged. However, chart review reveals she was seen in the ED on 07/12/16 for similar complaints, had a trigger point injection into the L shoulder region, but no work up was done that day, and she was discharged. She was also seen in the ED on 08/13/16 and had CXR and RST with strep culture done which were all unremarkable/negative, she was discharged with prednisone, flonase, albuterol inhaler, and guaifenesin-codeine cough syrup. On each of those visits, she reported that her symptoms had started ~2-3 days prior to the ED encounter. Today she is reporting that her symptoms started 6 wks ago and have not let up or changed. She is a nonsmoker. +Sick contacts recently, however she reports she was sick before they were sick. States that her flonase and albuterol have helped her symptoms; benadryl has helped for the ear itching but otherwise has not helped. She states that the cough syrup and prednisone from her last visit didn't help; and reports that mucinex has also not helped. Reports symptoms worsen with cigarette smoke or perfume/fragrance exposure.   She denies ear drainage, drooling, trismus, fevers, chills, CP, SOB, abd pain, N/V/D/C,  hematuria, dysuria, arthralgias, numbness, tingling, focal weakness, or any other complaints at this time.    The history is provided by the patient and medical records. No language interpreter was used.  Sore Throat  Associated symptoms include headaches. Pertinent negatives include no chest pain, no abdominal pain and no shortness of breath.  Cough  This is a new problem. The current episode started more than 1 week ago. The problem occurs constantly. The problem has not changed since onset.The cough is productive of sputum. There has been no fever. Associated symptoms include ear pain (itching and crackling sounds), headaches, rhinorrhea, sore throat, myalgias (generalized body aches) and wheezing. Pertinent negatives include no chest pain, no chills and no shortness of breath. She has tried cough syrup and decongestants (cough syrup, benadryl, mucinex, prednisone, flonase, and albuterol) for the symptoms. The treatment provided mild (flonase and albuterol helped, rest didn't help) relief. She is not a smoker. Her past medical history is significant for asthma.  Headache   Pertinent negatives include no fever, no shortness of breath, no nausea and no vomiting.    Past Medical History:  Diagnosis Date  . Asthma   . Bipolar 1 disorder (HCC)   . Hypertension   . Kidney stones     There are no active problems to display for this patient.   Past Surgical History:  Procedure Laterality Date  . ABDOMINAL HYSTERECTOMY    . APPENDECTOMY    . CHOLECYSTECTOMY      OB History    No data available  Home Medications    Prior to Admission medications   Medication Sig Start Date End Date Taking? Authorizing Provider  albuterol (PROVENTIL HFA;VENTOLIN HFA) 108 (90 Base) MCG/ACT inhaler Inhale 1-2 puffs into the lungs every 6 (six) hours as needed for wheezing or shortness of breath. 08/13/16   Rise MuKenneth T Leaphart, PA-C  Diclofenac 18 MG CAPS Take 18 mg by mouth 3 (three) times daily as  needed. Patient taking differently: Take 18 mg by mouth 3 (three) times daily as needed (pain).  05/25/16   Kayla Rose, PA-C  Diphenhydramine-APAP, sleep, (GOODY PM PO) Take 1 Package by mouth as needed (pain).    Historical Provider, MD  fluticasone (FLONASE) 50 MCG/ACT nasal spray Place 2 sprays into both nostrils daily. 08/13/16   Rise MuKenneth T Leaphart, PA-C  guaiFENesin-codeine 100-10 MG/5ML syrup Take 5 mLs by mouth 3 (three) times daily as needed for cough. 08/13/16   Rise MuKenneth T Leaphart, PA-C  guaiFENesin-dextromethorphan (ROBITUSSIN DM) 100-10 MG/5ML syrup Take 15 mLs by mouth every 4 (four) hours as needed for cough.    Historical Provider, MD  HYDROcodone-acetaminophen (NORCO/VICODIN) 5-325 MG per tablet Take 1 tablet by mouth every 6 (six) hours as needed for moderate pain. Patient not taking: Reported on 05/25/2016 02/10/14   Charlestine Nighthristopher Lawyer, PA-C  methocarbamol (ROBAXIN) 500 MG tablet Take 1 tablet (500 mg total) by mouth 2 (two) times daily. Patient not taking: Reported on 05/25/2016 09/26/15   Fayrene HelperBowie Tran, PA-C  oxyCODONE-acetaminophen (PERCOCET/ROXICET) 5-325 MG tablet Take 1 tablet by mouth every 4 (four) hours as needed for severe pain. Patient not taking: Reported on 05/25/2016 08/25/15   Hillard DankerShawn C Joy, PA-C  predniSONE (DELTASONE) 20 MG tablet Take 2 tablets (40 mg total) by mouth daily with breakfast. 08/13/16   Rise MuKenneth T Leaphart, PA-C  pregabalin (LYRICA) 75 MG capsule Take 75 mg by mouth 2 (two) times daily.    Historical Provider, MD  QUEtiapine (SEROQUEL XR) 400 MG 24 hr tablet Take 400 mg by mouth at bedtime.    Historical Provider, MD    Family History No family history on file.  Social History Social History  Substance Use Topics  . Smoking status: Never Smoker  . Smokeless tobacco: Never Used  . Alcohol use No     Allergies   Coconut flavor; Shellfish allergy; Ultram [tramadol]; Aspirin; and Eggs or egg-derived products   Review of Systems Review of Systems    Constitutional: Negative for chills and fever.  HENT: Positive for ear pain (itching and crackling sounds), rhinorrhea and sore throat. Negative for drooling, ear discharge and trouble swallowing.   Respiratory: Positive for cough and wheezing. Negative for shortness of breath.   Cardiovascular: Negative for chest pain.  Gastrointestinal: Negative for abdominal pain, constipation, diarrhea, nausea and vomiting.  Genitourinary: Negative for dysuria and hematuria.  Musculoskeletal: Positive for myalgias (generalized body aches). Negative for arthralgias.  Skin: Negative for color change.  Allergic/Immunologic: Negative for immunocompromised state.  Neurological: Positive for headaches. Negative for weakness and numbness.  Psychiatric/Behavioral: Negative for confusion.   10 Systems reviewed and are negative for acute change except as noted in the HPI.   Physical Exam Updated Vital Signs BP 138/88 (BP Location: Left Arm)   Pulse 92   Temp 97.9 F (36.6 C) (Oral)   Resp 14   Ht 5\' 2"  (1.575 m)   Wt 90.3 kg   SpO2 100%   BMI 36.40 kg/m   Physical Exam  Constitutional: She is oriented to person, place, and time.  Vital signs are normal. She appears well-developed and well-nourished.  Non-toxic appearance. No distress.  Afebrile, nontoxic, NAD  HENT:  Head: Normocephalic and atraumatic.  Right Ear: Hearing, external ear and ear canal normal. Tympanic membrane is not injected, not erythematous and not bulging. A middle ear effusion (serous) is present.  Left Ear: Hearing, external ear and ear canal normal. Tympanic membrane is not injected, not erythematous and not bulging. A middle ear effusion (serous) is present.  Nose: Mucosal edema and rhinorrhea present.  Mouth/Throat: Uvula is midline and mucous membranes are normal. No trismus in the jaw. No uvula swelling. Posterior oropharyngeal erythema present. No oropharyngeal exudate, posterior oropharyngeal edema or tonsillar abscesses.  Tonsils are 0 on the right. Tonsils are 0 on the left. No tonsillar exudate.  TMs with mild serous effusion bilaterally, but otherwise ears are clear bilaterally, no TM injection/erythema/bulging. Nose with mild mucosal edema and clear rhinorrhea. Oropharynx mildly injected, without uvular swelling or deviation, no trismus or drooling, no tonsillar swelling, no exudates.   No PTA.   Eyes: Conjunctivae and EOM are normal. Right eye exhibits no discharge. Left eye exhibits no discharge.  Neck: Normal range of motion. Neck supple.  Cardiovascular: Normal rate, regular rhythm, normal heart sounds and intact distal pulses.  Exam reveals no gallop and no friction rub.   No murmur heard. Pulmonary/Chest: Effort normal and breath sounds normal. No respiratory distress. She has no decreased breath sounds. She has no wheezes. She has no rhonchi. She has no rales.  CTAB in all lung fields, no w/r/r, no hypoxia or increased WOB, speaking in full sentences, SpO2 100% on RA   Abdominal: Soft. Normal appearance and bowel sounds are normal. She exhibits no distension. There is no tenderness. There is no rigidity, no rebound, no guarding, no CVA tenderness, no tenderness at McBurney's point and negative Murphy's sign.  Musculoskeletal: Normal range of motion.  Neurological: She is alert and oriented to person, place, and time. She has normal strength. No sensory deficit.  Skin: Skin is warm, dry and intact. No rash noted.  Psychiatric: She has a normal mood and affect.  Nursing note and vitals reviewed.    ED Treatments / Results  Labs (all labs ordered are listed, but only abnormal results are displayed) Labs Reviewed  RAPID STREP SCREEN (NOT AT [facility])  CULTURE, GROUP A STREP [facility])   Results for orders placed or performed during the hospital encounter of 08/27/16  Rapid strep screen  Result Value Ref Range   Streptococcus, Group A Screen (Direct) NEGATIVE NEGATIVE    EKG  EKG Interpretation None        Radiology Dg Chest 2 View  Result Date: 08/27/2016 CLINICAL DATA:  Cough for 6 weeks EXAM: CHEST  2 VIEW COMPARISON:  08/13/2016 FINDINGS: The heart size and mediastinal contours are within normal limits. Both lungs are clear. Degenerative osteophytes of the spine. Surgical clips in the right upper quadrant. IMPRESSION: No active cardiopulmonary disease. Electronically Signed   By: Jasmine Pang M.D.   On: 08/27/2016 14:38     Dg Chest 2 View  Result Date: 08/13/2016 CLINICAL DATA:  Productive cough EXAM: CHEST  2 VIEW COMPARISON:  05/25/2016 FINDINGS: The heart size and mediastinal contours are within normal limits. Both lungs are clear. The visualized skeletal structures are unremarkable. IMPRESSION: No active cardiopulmonary disease. Electronically Signed   By: Alcide Clever M.D.   On: 08/13/2016 13:21     Procedures Procedures (including critical care time)  Medications Ordered  in ED Medications  albuterol (PROVENTIL) (2.5 MG/3ML) 0.083% nebulizer solution 5 mg (5 mg Nebulization Given 08/27/16 1346)  ipratropium (ATROVENT) nebulizer solution 0.5 mg (0.5 mg Nebulization Given 08/27/16 1346)  ketorolac (TORADOL) 30 MG/ML injection 30 mg (30 mg Intramuscular Given 08/27/16 1348)     Initial Impression / Assessment and Plan / ED Course  I have reviewed the triage vital signs and the nursing notes.  Pertinent labs & imaging results that were available during my care of the patient were reviewed by me and considered in my medical decision making (see chart for details).     56 y.o. female here with sore throat, cough, body aches, ear itching/crackling, rhinorrhea, and wheezing x6 wks; states she's had nonstop symptoms since onset, however she's had 2 prior ED visits in last 7wks at which time she had stated her symptoms started several days prior to the visit. On exam, mild nasal congestion and rhinorrhea, clear lung exam, mild throat injection, tonsils clear, ears with mild serous  effusion but otherwise clear. Symptoms most likely related to allergies. ED visit 08/13/16 she had RST, strep culture, and CXR which were all negative. Today RST was performed prior to evaluation, and is negative. I feel it's reasonable to get CXR now to ensure no developing PNA; will also give toradol and duoneb. Doubt need for repeat course of prednisone at this time. Will reassess shortly  2:45 PM CXR negative. Pt feeling somewhat after breathing tx and toradol. Overall her symptoms seem most consistent with seasonal allergies, doubt need for antibiotics; will have pt start on daily antihistamine, advised continued use of albuterol inhaler; will give tesslon perles for cough suppression, advised other OTC remedies for symptom relief; f/up with PCP/[facility] in 1wk for recheck and ongoing management. I explained the diagnosis and have given explicit precautions to return to the ER including for any other new or worsening symptoms. The patient understands and accepts the medical plan as it's been dictated and I have answered their questions. Discharge instructions concerning home care and prescriptions have been given. The patient is STABLE and is discharged to home in good condition.   Final Clinical Impressions(s) / ED Diagnoses   Final diagnoses:  Acute seasonal allergic rhinitis due to other allergen  Sore throat  Cough  Upper respiratory tract infection, unspecified type    New Prescriptions New Prescriptions   ALBUTEROL (PROVENTIL HFA;VENTOLIN HFA) 108 (90 BASE) MCG/ACT INHALER    Inhale 2 puffs into the lungs every 4 (four) hours as needed for wheezing or shortness of breath (cough).   BENZONATATE (TESSALON) 200 MG CAPSULE    Take 1 capsule (200 mg total) by mouth 3 (three) times daily as needed for cough.     [address], PA-C 08/27/16 1445    Tilden Fossa, MD 08/30/16 1536

## 2016-08-27 NOTE — Discharge Instructions (Signed)
Continue to stay well-hydrated. Gargle warm salt water and spit it out. Use chloraseptic spray as needed for sore throat. Continue to alternate between Tylenol and Ibuprofen for pain or fever. Use Mucinex for cough suppression/expectoration of mucus. Use netipot and flonase to help with nasal congestion. Use over-the-counter Benadryl or other antihistamine (such as claritin, zyrtec, allegra, etc) daily to decrease secretions and for help with your symptoms. Use inhaler as directed, as needed for cough/chest congestion/wheezing/shortness of breath. Use Tesslon pearls for cough suppression. Follow up with your primary care doctor in 5-7 days for recheck of ongoing symptoms; if you don't have a regular doctor, then follow up with the [facility] in 1 week for recheck and to establish medical care. Return to emergency department for emergent changing or worsening of symptoms.

## 2016-08-27 NOTE — ED Triage Notes (Signed)
Pt complains of sore throat, productive cough, body aches for >6 weeks. Pt also complains of headache and left upper back/shoulder pain

## 2016-08-29 LAB — CULTURE, GROUP A STREP (THRC)

## 2016-11-08 ENCOUNTER — Emergency Department (HOSPITAL_COMMUNITY): Payer: Medicare Other

## 2016-11-08 ENCOUNTER — Emergency Department (HOSPITAL_COMMUNITY)
Admission: EM | Admit: 2016-11-08 | Discharge: 2016-11-09 | Disposition: A | Discharge status: Home / Self Care | Payer: Medicare Other | Attending: Emergency Medicine | Admitting: Emergency Medicine

## 2016-11-08 ENCOUNTER — Encounter (HOSPITAL_COMMUNITY): Payer: Self-pay

## 2016-11-08 DIAGNOSIS — R1013 Epigastric pain: Secondary | ICD-10-CM | POA: Diagnosis not present

## 2016-11-08 DIAGNOSIS — Z5321 Procedure and treatment not carried out due to patient leaving prior to being seen by health care provider: Secondary | ICD-10-CM | POA: Diagnosis not present

## 2016-11-08 LAB — COMPREHENSIVE METABOLIC PANEL
ALT: 9 U/L — ABNORMAL LOW (ref 14–54)
AST: 17 U/L (ref 15–41)
Albumin: 3.2 g/dL — ABNORMAL LOW (ref 3.5–5.0)
Alkaline Phosphatase: 88 U/L (ref 38–126)
Anion gap: 8 (ref 5–15)
BUN: 6 mg/dL (ref 6–20)
CO2: 27 mmol/L (ref 22–32)
Calcium: 8.5 mg/dL — ABNORMAL LOW (ref 8.9–10.3)
Chloride: 104 mmol/L (ref 101–111)
Creatinine, Ser: 0.72 mg/dL (ref 0.44–1.00)
GFR calc Af Amer: >60 mL/min (ref >60)
GFR calc non Af Amer: >60 mL/min (ref >60)
Glucose, Bld: 171 mg/dL — ABNORMAL HIGH (ref 65–99)
Potassium: 3.3 mmol/L — ABNORMAL LOW (ref 3.5–5.1)
Sodium: 139 mmol/L (ref 135–145)
Total Bilirubin: 0.4 mg/dL (ref 0.3–1.2)
Total Protein: 6.3 g/dL — ABNORMAL LOW (ref 6.5–8.1)

## 2016-11-08 LAB — URINALYSIS, ROUTINE W REFLEX MICROSCOPIC
Bilirubin Urine: NEGATIVE
Glucose, UA: 50 mg/dL — AB
Hgb urine dipstick: NEGATIVE
Ketones, ur: NEGATIVE mg/dL
Leukocytes, UA: NEGATIVE
Nitrite: NEGATIVE
Protein, ur: NEGATIVE mg/dL
Specific Gravity, Urine: 1.025 (ref 1.005–1.030)
pH: 5 (ref 5.0–8.0)

## 2016-11-08 LAB — CBC
HCT: 30.4 % — ABNORMAL LOW (ref 36.0–46.0)
Hemoglobin: 9.1 g/dL — ABNORMAL LOW (ref 12.0–15.0)
MCH: 21.5 pg — ABNORMAL LOW (ref 26.0–34.0)
MCHC: 29.9 g/dL — ABNORMAL LOW (ref 30.0–36.0)
MCV: 71.9 fL — ABNORMAL LOW (ref 78.0–100.0)
Platelets: 386 10*3/uL (ref 150–400)
RBC: 4.23 MIL/uL (ref 3.87–5.11)
RDW: 14.7 % (ref 11.5–15.5)
WBC: 8.4 10*3/uL (ref 4.0–10.5)

## 2016-11-08 LAB — LIPASE, BLOOD: Lipase: 42 U/L (ref 11–51)

## 2016-11-08 LAB — I-STAT TROPONIN, ED: Troponin i, poc: 0 ng/mL (ref 0.00–0.08)

## 2016-11-08 NOTE — ED Triage Notes (Signed)
Patient called for room x 3, no answer. EMT and RN looked around lobby and do not see patient. Moved name off the floor.

## 2016-11-08 NOTE — ED Triage Notes (Signed)
Pt states she has had epigastric pain X3 weeks. She reports the pain radiates under her left breast. Pt describes as stabbing pain. She also reports generalized abd pain. Hx of kidney stones. Resp e/u. Skin warm and dry.

## 2016-11-08 NOTE — ED Notes (Signed)
Patient did not answer when called for room x 2. Name moved back to waiting area, will attempt to call for the next room once available.

## 2017-03-22 ENCOUNTER — Ambulatory Visit: Payer: Medicaid Other | Admitting: Neurology

## 2017-04-23 ENCOUNTER — Emergency Department (HOSPITAL_COMMUNITY): Payer: Medicare Other

## 2017-04-23 ENCOUNTER — Other Ambulatory Visit: Payer: Self-pay

## 2017-04-23 ENCOUNTER — Encounter (HOSPITAL_COMMUNITY): Payer: Self-pay | Admitting: Emergency Medicine

## 2017-04-23 ENCOUNTER — Emergency Department (HOSPITAL_COMMUNITY)
Admission: EM | Admit: 2017-04-23 | Discharge: 2017-04-23 | Disposition: A | Discharge status: Home / Self Care | Payer: Medicare Other | Attending: Emergency Medicine | Admitting: Emergency Medicine

## 2017-04-23 DIAGNOSIS — R202 Paresthesia of skin: Secondary | ICD-10-CM | POA: Insufficient documentation

## 2017-04-23 DIAGNOSIS — I10 Essential (primary) hypertension: Secondary | ICD-10-CM | POA: Insufficient documentation

## 2017-04-23 DIAGNOSIS — J029 Acute pharyngitis, unspecified: Secondary | ICD-10-CM | POA: Diagnosis not present

## 2017-04-23 DIAGNOSIS — R0981 Nasal congestion: Secondary | ICD-10-CM | POA: Insufficient documentation

## 2017-04-23 DIAGNOSIS — M79606 Pain in leg, unspecified: Secondary | ICD-10-CM | POA: Insufficient documentation

## 2017-04-23 DIAGNOSIS — Z91012 Allergy to eggs: Secondary | ICD-10-CM | POA: Diagnosis not present

## 2017-04-23 DIAGNOSIS — G8929 Other chronic pain: Secondary | ICD-10-CM | POA: Diagnosis not present

## 2017-04-23 DIAGNOSIS — Z91013 Allergy to seafood: Secondary | ICD-10-CM | POA: Diagnosis not present

## 2017-04-23 DIAGNOSIS — R05 Cough: Secondary | ICD-10-CM | POA: Diagnosis present

## 2017-04-23 DIAGNOSIS — J069 Acute upper respiratory infection, unspecified: Secondary | ICD-10-CM | POA: Diagnosis not present

## 2017-04-23 DIAGNOSIS — B9789 Other viral agents as the cause of diseases classified elsewhere: Secondary | ICD-10-CM

## 2017-04-23 DIAGNOSIS — J45909 Unspecified asthma, uncomplicated: Secondary | ICD-10-CM | POA: Insufficient documentation

## 2017-04-23 LAB — RAPID STREP SCREEN (MED CTR MEBANE ONLY): Streptococcus, Group A Screen (Direct): NEGATIVE

## 2017-04-23 MED ORDER — SALINE SPRAY 0.65 % NA SOLN: 1.0000 | NASAL | 0 refills | Status: DC | PRN

## 2017-04-23 MED ORDER — ALBUTEROL SULFATE HFA 108 (90 BASE) MCG/ACT IN AERS
2.0000 | INHALATION_SPRAY | Freq: Once | RESPIRATORY_TRACT | Status: AC
  Administered 2017-04-23: 2 via RESPIRATORY_TRACT
  Filled 2017-04-23: qty 6.7

## 2017-04-23 MED ORDER — PROMETHAZINE-DM 6.25-15 MG/5ML PO SYRP: 5.0000 mL | ORAL_SOLUTION | Freq: Four times a day (QID) | ORAL | 0 refills | Status: DC | PRN

## 2017-04-23 NOTE — ED Triage Notes (Addendum)
Pt also c/o BLE pain, "feels like bugs crawling on me" and shooting pains, ongoing for 6 months.  Pt denies injury, falls.

## 2017-04-23 NOTE — ED Notes (Signed)
Declined W/C at D/C and was escorted to lobby by RN. 

## 2017-04-23 NOTE — ED Triage Notes (Signed)
Pt reports coughing, nasal congestion, sore throat,  body aches since yesterday. Pt denies fever, chills, n/v.  Afebrile in triage.

## 2017-04-23 NOTE — Discharge Instructions (Signed)
You can use the albuterol inhaler 2 puffs every 4 hours as needed for shortness of breath.  Take 5 mL of cough syrup once every 6 hours as needed for nasal congestion and cough.  Please note this medication may make you somewhat drowsy so please do not take before working or driving.  Spray 1 spray of Ocean nasal spray into each nostril as directed for congestion.  Viral symptoms can take 7-10 days before they can start to significantly resolve.  Continue to drink plenty of fluids to avoid getting dehydrated.  You can also use a coolmist vaporizer.  Directions are attached.  Please call the number on your discharge paperwork to get established with primary care.  If you continue to have problems with your Medicare, please follow-up with [facility].  Their address and contact information is provided above.

## 2017-04-23 NOTE — ED Provider Notes (Signed)
[facility] EMERGENCY DEPARTMENT Provider Note   CSN: [id number] Arrival date & time: 04/23/17  1143     History   Chief Complaint Chief Complaint  Patient presents with  . URI  . Leg Pain    HPI Emily Cochran is a 56 y.o. female with a history of asthma, bronchitis, and epilepsy who presents to the emergency department with a chief complaint of cough.  The patient reports that she works for a [employer] and had a severe coughing spell that began yesterday after she used ammonia to clean the bathroom.  She reports that the spell subsided, but is continued to have cough with chest pain that is only present while coughing.  She reports that the cough is intermittently productive with clear sputum.  She took her albuterol inhaler at home without improvement.  She also complains of associated sore throat and nasal congestion.  No other treatment prior to arrival for her symptoms.  She denies fever, chills, headache, or otalgia.  She also complains of chronic tingling in her bilateral fingertips and bilateral toes that has been present for the last 4 months.  No change prior to arrival.  She reports that she was diagnosed with fibromyalgia and states that the symptoms feel consistent with her fibromyalgia.  The history is provided by the patient. No language interpreter was used.  URI   This is a new problem. The current episode started yesterday. The problem has been gradually improving. There has been no fever. Associated symptoms include congestion, sore throat and cough. Pertinent negatives include no chest pain, no abdominal pain, no diarrhea, no nausea, no vomiting, no ear pain, no sinus pain and no rash. She has tried an inhaler for the symptoms. The treatment provided no relief.    Past Medical History:  Diagnosis Date  . Asthma   . Bipolar 1 disorder (HCC)   . Hypertension   . Kidney stones     There are no active problems to display for this  patient.   Past Surgical History:  Procedure Laterality Date  . ABDOMINAL HYSTERECTOMY    . APPENDECTOMY    . CHOLECYSTECTOMY      OB History    No data available       Home Medications    Prior to Admission medications   Medication Sig Start Date End Date Taking? Authorizing Provider  albuterol (PROVENTIL HFA;VENTOLIN HFA) 108 (90 Base) MCG/ACT inhaler Inhale 1-2 puffs into the lungs every 6 (six) hours as needed for wheezing or shortness of breath. 08/13/16   Rise Mu, PA-C  albuterol (PROVENTIL HFA;VENTOLIN HFA) 108 (90 Base) MCG/ACT inhaler Inhale 2 puffs into the lungs every 4 (four) hours as needed for wheezing or shortness of breath (cough). 08/27/16   Street, Mercedes, PA-C  benzonatate (TESSALON) 200 MG capsule Take 1 capsule (200 mg total) by mouth 3 (three) times daily as needed for cough. 08/27/16   Street, [place], PA-C  Diclofenac 18 MG CAPS Take 18 mg by mouth 3 (three) times daily as needed. Patient taking differently: Take 18 mg by mouth 3 (three) times daily as needed (pain).  05/25/16   Cheri Fowler, PA-C  Diphenhydramine-APAP, sleep, (GOODY PM PO) Take 1 Package by mouth daily as needed (pain).     [provider]  fluticasone (FLONASE) 50 MCG/ACT nasal spray Place 2 sprays into both nostrils daily. 08/13/16   Rise Mu, PA-C  guaiFENesin-codeine 100-10 MG/5ML syrup Take 5 mLs by mouth 3 (  three) times daily as needed for cough. 08/13/16   Rise MuLeaphart, Kenneth T, PA-C  guaiFENesin-dextromethorphan (ROBITUSSIN DM) 100-10 MG/5ML syrup Take 15 mLs by mouth every 4 (four) hours as needed for cough.    [provider]  ibuprofen (ADVIL,MOTRIN) 600 MG tablet Take 1 tablet (600 mg total) by mouth every 8 (eight) hours as needed for fever, headache, moderate pain or cramping. 08/27/16   Street, [place], PA-C  pregabalin (LYRICA) 75 MG capsule Take 75 mg by mouth 2 (two) times daily.    [provider]  promethazine-dextromethorphan  (PROMETHAZINE-DM) 6.25-15 MG/5ML syrup Take 5 mLs 4 (four) times daily as needed by mouth for cough. 04/23/17   Maryanna Stuber A, PA-C  QUEtiapine (SEROQUEL XR) 400 MG 24 hr tablet Take 400 mg by mouth at bedtime.    [provider]  QUEtiapine (SEROQUEL) 200 MG tablet Take 200 mg by mouth 2 (two) times daily. 07/24/16   [provider]  sodium chloride (OCEAN) 0.65 % SOLN nasal spray Place 1 spray as needed into both nostrils for congestion. 04/23/17   Ilithyia Titzer, Pedro EarlsMia A, PA-C    Family History No family history on file.  Social History Social History   Tobacco Use  . Smoking status: Never Smoker  . Smokeless tobacco: Never Used  Substance Use Topics  . Alcohol use: No  . Drug use: No     Allergies   Coconut flavor; Shellfish allergy; Ultram [tramadol]; Aspirin; and Eggs or egg-derived products   Review of Systems Review of Systems  Constitutional: Negative for activity change, chills and fever.  HENT: Positive for congestion and sore throat. Negative for ear pain, sinus pressure and sinus pain.   Respiratory: Positive for cough and shortness of breath.   Cardiovascular: Negative for chest pain.  Gastrointestinal: Negative for abdominal pain, diarrhea, nausea and vomiting.  Musculoskeletal: Negative for back pain.  Skin: Negative for rash.     Physical Exam Updated Vital Signs BP (!) 155/94 (BP Location: Right Arm)   Pulse 70   Temp 98.7 F (37.1 C) (Oral)   Resp 16   Ht 5\' 2"  (1.575 m)   Wt 90.7 kg (200 lb)   SpO2 97%   BMI 36.58 kg/m   Physical Exam  Constitutional: No distress.  HENT:  Head: Normocephalic.  Right Ear: Tympanic membrane and ear canal normal. No mastoid tenderness.  Left Ear: Tympanic membrane and ear canal normal. No mastoid tenderness.  Nose: Rhinorrhea present. No mucosal edema. Right sinus exhibits no maxillary sinus tenderness and no frontal sinus tenderness. Left sinus exhibits no maxillary sinus tenderness and no frontal  sinus tenderness.  Mouth/Throat: Uvula is midline, oropharynx is clear and moist and mucous membranes are normal.  Eyes: Conjunctivae are normal.  Neck: Neck supple.  Cardiovascular: Normal rate and regular rhythm. Exam reveals no gallop and no friction rub.  No murmur heard. Pulmonary/Chest: Effort normal. No respiratory distress.  No acute distress.  Lungs are clear to auscultation bilaterally.  Abdominal: Soft. She exhibits no distension.  Musculoskeletal:  Symmetric tandem gait.  Neurological: She is alert.  Skin: Skin is warm. No rash noted.  Psychiatric: Her behavior is normal.  Nursing note and vitals reviewed.  ED Treatments / Results  Labs (all labs ordered are listed, but only abnormal results are displayed) Labs Reviewed  RAPID STREP SCREEN (NOT AT [facility])  CULTURE, GROUP A STREP [facility])    EKG  EKG Interpretation None       Radiology Dg Chest 2 View  Result Date: 04/23/2017 CLINICAL DATA:  Cough, nasal congestion and sore throat. EXAM: CHEST  2 VIEW COMPARISON:  11/08/2016 FINDINGS: The heart size and mediastinal contours are within normal limits. Both lungs are clear. Mild degenerative disc disease along the dorsal spine. No acute nor suspicious osseous abnormalities. Cholecystectomy clips are seen in the right upper quadrant. IMPRESSION: No active cardiopulmonary disease. No significant change from prior. Electronically Signed   By: Tollie Ethavid  Kwon M.D.   On: 04/23/2017 15:47    Procedures Procedures (including critical care time)  Medications Ordered in ED Medications  albuterol (PROVENTIL HFA;VENTOLIN HFA) 108 (90 Base) MCG/ACT inhaler 2 puff (2 puffs Inhalation Given 04/23/17 1618)     Initial Impression / Assessment and Plan / ED Course  I have reviewed the triage vital signs and the nursing notes.  Pertinent labs & imaging results that were available during my care of the patient were reviewed by me and considered in my medical decision making (see chart  for details).     Pt CXR negative for acute infiltrate. Patients symptoms are consistent with URI, likely viral etiology. Discussed that antibiotics are not indicated for viral infections. Pt will be discharged with symptomatic treatment.  SaO2 97% on room air.  Afebrile.  Vital signs are stable.  The patient also expressed concerns about her chronic leg pain that she said was consistent with her fibromyalgia.  She is not established with primary care due to an issue with her insurance  [facility] for assistance with her application so that she can get PCP follow-up.  Verbalizes understanding and is agreeable with plan. Pt is hemodynamically stable & in NAD prior to dc.   Final Clinical Impressions(s) / ED Diagnoses   Final diagnoses:  Viral URI with cough    ED Discharge Orders        Ordered    promethazine-dextromethorphan (PROMETHAZINE-DM) 6.25-15 MG/5ML syrup  4 times daily PRN     04/23/17 1630    sodium chloride (OCEAN) 0.65 % SOLN nasal spray  As needed     04/23/17 1630       Nyxon Strupp A, PA-C 04/23/17 1635    Gerhard MunchLockwood, Robert, MD 04/28/17 [id number]

## 2017-04-25 LAB — CULTURE, GROUP A STREP (THRC)

## 2017-05-12 ENCOUNTER — Ambulatory Visit: Payer: Medicaid Other | Admitting: Neurology

## 2017-05-12 ENCOUNTER — Telehealth: Payer: Self-pay | Admitting: *Deleted

## 2017-05-12 NOTE — Telephone Encounter (Signed)
Patient no show new pt appt on 05/12/2017 @ 08:00

## 2017-05-20 ENCOUNTER — Encounter: Payer: Self-pay | Admitting: Neurology

## 2018-02-28 ENCOUNTER — Encounter (HOSPITAL_COMMUNITY): Payer: Self-pay

## 2018-02-28 ENCOUNTER — Ambulatory Visit (HOSPITAL_COMMUNITY)
Admission: EM | Admit: 2018-02-28 | Discharge: 2018-02-28 | Disposition: A | Discharge status: Home / Self Care | Payer: Medicare Other | Attending: Family Medicine | Admitting: Family Medicine

## 2018-02-28 DIAGNOSIS — R519 Headache, unspecified: Secondary | ICD-10-CM

## 2018-02-28 DIAGNOSIS — R51 Headache: Secondary | ICD-10-CM

## 2018-02-28 DIAGNOSIS — M79605 Pain in left leg: Secondary | ICD-10-CM

## 2018-02-28 DIAGNOSIS — J0100 Acute maxillary sinusitis, unspecified: Secondary | ICD-10-CM

## 2018-02-28 MED ORDER — AMOXICILLIN-POT CLAVULANATE 875-125 MG PO TABS: 1.0000 | ORAL_TABLET | Freq: Two times a day (BID) | ORAL | 0 refills | Status: DC

## 2018-02-28 MED ORDER — NAPROXEN 500 MG PO TABS: 500.0000 mg | ORAL_TABLET | Freq: Two times a day (BID) | ORAL | 0 refills | Status: DC

## 2018-02-28 NOTE — ED Provider Notes (Signed)
[facility]   [id number] 02/28/18 Arrival Time: 0915  ASSESSMENT & PLAN:  1. Nonintractable headache, unspecified chronicity pattern, unspecified headache type   2. Acute non-recurrent maxillary sinusitis   3. Leg pain, superior, left     Meds ordered this encounter  Medications  . amoxicillin-clavulanate (AUGMENTIN) 875-125 MG tablet    Sig: Take 1 tablet by mouth every 12 (twelve) hours.    Dispense:  20 tablet    Refill:  0  . naproxen (NAPROSYN) 500 MG tablet    Sig: Take 1 tablet (500 mg total) by mouth 2 (two) times daily with a meal.    Dispense:  20 tablet    Refill:  0   Discussed typical duration of symptoms. OTC symptom care as needed. Ensure adequate fluid intake and rest. May f/u with PCP or here as needed.  Leg pain likely inflammatory. No neurologic symptoms. Trial of Naprosyn.  Reviewed expectations re: course of current medical issues. Questions answered. Outlined signs and symptoms indicating need for more acute intervention. Patient verbalized understanding. After Visit Summary given.   SUBJECTIVE: History from: patient.  Emily Cochran is a 57 y.o. female who presents with complaint of nasal congestion, post-nasal drainage, and sinus pain/headache. Onset gradual, approximately several weeks ago. Respiratory symptoms: occasional cough. Fever: no. Overall normal PO intake without n/v. OTC treatment: occasional Benadryl; questions help. She wonders if current symptoms are related to mold in her apartment. History of frequent sinus infections: no. Social History   Tobacco Use  Smoking Status Never Smoker  Smokeless Tobacco Never Used   Also reports intermittent LLE discomfort. From low R back extending over posterior thigh. Dull pain. No injury. No extremity sensation changes or weakness. Worse after prolonged standing/ambulation. No OTC analgesics needed/taken. Normal bowel/bladder habits.  ROS: As per HPI.   OBJECTIVE:  Vitals:   02/28/18 0956 02/28/18 1005  BP: (!) 194/89   Pulse: 70   Resp: 16   Temp: 98.2 F (36.8 C)   TempSrc: Oral   SpO2: 100%   Weight:  93 kg     General appearance: alert; appears fatigued HEENT: nasal congestion; clear runny nose; throat irritation secondary to post-nasal drainage; bilateral maxillary tenderness to palpation; turbinates boggy Neck: supple without LAD Lungs: unlabored respirations, symmetrical air entry; cough: absent; no respiratory distress Skin: warm and dry Psychological: alert and cooperative; normal mood and affect  Allergies  Allergen Reactions  . Coconut Flavor Shortness Of Breath and Swelling  . Shellfish Allergy Shortness Of Breath and Swelling    She can eat shrimp  . Ultram [Tramadol] Anaphylaxis    Pt REPORTS THROAT SWELLING  . Aspirin Swelling and Other (See Comments)    Angioedema, cuts on lips  . Eggs Or Egg-Derived Products Swelling    Past Medical History:  Diagnosis Date  . Asthma   . Bipolar 1 disorder (HCC)   . Hypertension   . Kidney stones    History reviewed. No pertinent family history. Social History   Socioeconomic History  . Marital status: Single    Spouse name: Not on file  . Number of children: Not on file  . Years of education: Not on file  . Highest education level: Not on file  Occupational History  . Not on file  Social Needs  . Financial resource strain: Not on file  . Food insecurity:    Worry: Not on file    Inability: Not on file  . Transportation needs:    Medical: Not  on file    Non-medical: Not on file  Tobacco Use  . Smoking status: Never Smoker  . Smokeless tobacco: Never Used  Substance and Sexual Activity  . Alcohol use: No  . Drug use: No  . Sexual activity: Not on file  Lifestyle  . Physical activity:    Days per week: Not on file    Minutes per session: Not on file  . Stress: Not on file  Relationships  . Social connections:    Talks on phone: Not on file    Gets together: Not on file     Attends religious service: Not on file    Active member of club or organization: Not on file    Attends meetings of clubs or organizations: Not on file    Relationship status: Not on file  . Intimate partner violence:    Fear of current or ex partner: Not on file    Emotionally abused: Not on file    Physically abused: Not on file    Forced sexual activity: Not on file  Other Topics Concern  . Not on file  Social History Narrative  . Not on file            [name], [name], MD 02/28/18 1112

## 2018-02-28 NOTE — Discharge Instructions (Signed)
You may benefit from taking an over the counter allergy medicine such as Claritin daily.

## 2018-02-28 NOTE — ED Triage Notes (Signed)
Pt states she has mold in her apt. And headaches, and when she blow her nose she see blood and right leg pain x month.

## 2018-03-16 ENCOUNTER — Encounter (HOSPITAL_COMMUNITY): Payer: Self-pay

## 2018-03-16 ENCOUNTER — Other Ambulatory Visit: Payer: Self-pay

## 2018-03-16 ENCOUNTER — Emergency Department (HOSPITAL_COMMUNITY): Payer: Medicare Other

## 2018-03-16 ENCOUNTER — Emergency Department (HOSPITAL_COMMUNITY)
Admission: EM | Admit: 2018-03-16 | Discharge: 2018-03-16 | Disposition: A | Discharge status: Home / Self Care | Payer: Medicare Other | Attending: Emergency Medicine | Admitting: Emergency Medicine

## 2018-03-16 DIAGNOSIS — I10 Essential (primary) hypertension: Secondary | ICD-10-CM | POA: Insufficient documentation

## 2018-03-16 DIAGNOSIS — Z79899 Other long term (current) drug therapy: Secondary | ICD-10-CM | POA: Diagnosis not present

## 2018-03-16 DIAGNOSIS — M1711 Unilateral primary osteoarthritis, right knee: Secondary | ICD-10-CM

## 2018-03-16 DIAGNOSIS — G8929 Other chronic pain: Secondary | ICD-10-CM | POA: Insufficient documentation

## 2018-03-16 DIAGNOSIS — J45909 Unspecified asthma, uncomplicated: Secondary | ICD-10-CM | POA: Diagnosis not present

## 2018-03-16 DIAGNOSIS — M25561 Pain in right knee: Secondary | ICD-10-CM | POA: Diagnosis present

## 2018-03-16 NOTE — ED Triage Notes (Signed)
Pt here for bilateral leg pain.  Pt said her right leg began to hurt 6 months ago, has been limping since and now her left leg is sore.  No injury or trauma to the legs in 6 months. No DM, HTN, no cardiac hx. A&Ox4, ambulatory to triage.

## 2018-03-16 NOTE — ED Provider Notes (Signed)
[facility] EMERGENCY DEPARTMENT Provider Note   CSN: [id number] Arrival date & time: 03/16/18  1551   History   Chief Complaint Chief Complaint  Patient presents with  . Leg Pain    bilateral     HPI Emily Cochran is a 57 y.o. female.   HPI  57 year old female presents today with complaints of chronic right knee pain.  Patient notes over the last 6 months she has had pain in the right anterior lateral knee.  She notes is worse with ambulation.  She notes that she has been limping on the leg and using more weight on the left which has caused some anterior knee pain in the left.  She denies any redness warmth or swelling.  She denies any trauma to the knee.  She notes using BC powders for pain.   Past Medical History:  Diagnosis Date  . Asthma   . Bipolar 1 disorder (HCC)   . Hypertension   . Kidney stones     There are no active problems to display for this patient.   Past Surgical History:  Procedure Laterality Date  . ABDOMINAL HYSTERECTOMY    . APPENDECTOMY    . CHOLECYSTECTOMY       OB History   None      Home Medications    Prior to Admission medications   Medication Sig Start Date End Date Taking? Authorizing Provider  albuterol (PROVENTIL HFA;VENTOLIN HFA) 108 (90 Base) MCG/ACT inhaler Inhale 1-2 puffs into the lungs every 6 (six) hours as needed for wheezing or shortness of breath. 08/13/16   Rise Mu, PA-C  albuterol (PROVENTIL HFA;VENTOLIN HFA) 108 (90 Base) MCG/ACT inhaler Inhale 2 puffs into the lungs every 4 (four) hours as needed for wheezing or shortness of breath (cough). 08/27/16   Street, Mercedes, PA-C  amoxicillin-clavulanate (AUGMENTIN) 875-125 MG tablet Take 1 tablet by mouth every 12 (twelve) hours. 02/28/18   Mardella Layman, MD  benzonatate (TESSALON) 200 MG capsule Take 1 capsule (200 mg total) by mouth 3 (three) times daily as needed for cough. 08/27/16   Street, [place], PA-C  Diclofenac 18 MG CAPS Take 18  mg by mouth 3 (three) times daily as needed. Patient taking differently: Take 18 mg by mouth 3 (three) times daily as needed (pain).  05/25/16   Cheri Fowler, PA-C  Diphenhydramine-APAP, sleep, (GOODY PM PO) Take 1 Package by mouth daily as needed (pain).     [provider]  fluticasone (FLONASE) 50 MCG/ACT nasal spray Place 2 sprays into both nostrils daily. 08/13/16   Rise Mu, PA-C  guaiFENesin-codeine 100-10 MG/5ML syrup Take 5 mLs by mouth 3 (three) times daily as needed for cough. 08/13/16   Rise Mu, PA-C  guaiFENesin-dextromethorphan (ROBITUSSIN DM) 100-10 MG/5ML syrup Take 15 mLs by mouth every 4 (four) hours as needed for cough.    [provider]  ibuprofen (ADVIL,MOTRIN) 600 MG tablet Take 1 tablet (600 mg total) by mouth every 8 (eight) hours as needed for fever, headache, moderate pain or cramping. 08/27/16   Street, [place], PA-C  naproxen (NAPROSYN) 500 MG tablet Take 1 tablet (500 mg total) by mouth 2 (two) times daily with a meal. 02/28/18   Mardella Layman, MD  pregabalin (LYRICA) 75 MG capsule Take 75 mg by mouth 2 (two) times daily.    [provider]  promethazine-dextromethorphan (PROMETHAZINE-DM) 6.25-15 MG/5ML syrup Take 5 mLs 4 (four) times daily as needed by mouth for cough. 04/23/17  McDonald, Mia A, PA-C  QUEtiapine (SEROQUEL XR) 400 MG 24 hr tablet Take 400 mg by mouth at bedtime.    [provider]  QUEtiapine (SEROQUEL) 200 MG tablet Take 200 mg by mouth 2 (two) times daily. 07/24/16   [provider]  sodium chloride (OCEAN) 0.65 % SOLN nasal spray Place 1 spray as needed into both nostrils for congestion. 04/23/17   McDonald, Coral Else, PA-C    Family History History reviewed. No pertinent family history.  Social History Social History   Tobacco Use  . Smoking status: Never Smoker  . Smokeless tobacco: Never Used  Substance Use Topics  . Alcohol use: No  . Drug use: No     Allergies   Coconut  flavor; Shellfish allergy; Ultram [tramadol]; Aspirin; and Eggs or egg-derived products   Review of Systems Review of Systems  All other systems reviewed and are negative.    Physical Exam Updated Vital Signs BP (!) 150/48   Pulse 85   Temp 98.2 F (36.8 C) (Oral)   Resp 19   SpO2 100%   Physical Exam  Constitutional: She is oriented to person, place, and time. She appears well-developed and well-nourished.  HENT:  Head: Normocephalic and atraumatic.  Eyes: Pupils are equal, round, and reactive to light. Conjunctivae are normal. Right eye exhibits no discharge. Left eye exhibits no discharge. No scleral icterus.  Neck: Normal range of motion. No JVD present. No tracheal deviation present.  Pulmonary/Chest: Effort normal. No stridor.  Musculoskeletal:  Bilateral knees atraumatic without swelling or edema, minor tenderness palpation of the right anterior lateral joint line, no significant laxity noted, no redness or warmth to touch  Neurological: She is alert and oriented to person, place, and time. Coordination normal.  Psychiatric: She has a normal mood and affect. Her behavior is normal. Judgment and thought content normal.  Nursing note and vitals reviewed.    ED Treatments / Results  Labs (all labs ordered are listed, but only abnormal results are displayed) Labs Reviewed - No data to display  EKG None  Radiology Dg Knee Complete 4 Views Right  Result Date: 03/16/2018 CLINICAL DATA:  Pt here for bilateral leg pain. Pt said her right leg began to hurt 6 months ago, has been limping since and now her left leg is sore. No injury or trauma to the legs in 6 months EXAM: RIGHT KNEE - COMPLETE 4+ VIEW COMPARISON:  08/25/2015 FINDINGS: No fracture or bone lesion. Mild medial joint space compartment narrowing with small marginal osteophytes. No other arthropathic change. No convincing joint effusion. Soft tissues are unremarkable. IMPRESSION: 1. No fracture or acute finding. 2.  Mild osteoarthritis of the medial compartment. Electronically Signed   By: Amie Portland M.D.   On: 03/16/2018 18:26    Procedures Procedures (including critical care time)  Medications Ordered in ED Medications - No data to display   Initial Impression / Assessment and Plan / ED Course  I have reviewed the triage vital signs and the nursing notes.  Pertinent labs & imaging results that were available during my care of the patient were reviewed by me and considered in my medical decision making (see chart for details).         57 year old female presents today with likely osteoarthritis.  No signs of infection, no signs of trauma.  Patient discharged with symptomatic care outpatient follow-up and strict return precautions.  She verbalized understanding and agreement to today's plan had no further questions or concerns.  Final Clinical Impressions(s) / ED Diagnoses   Final diagnoses:  Osteoarthritis of right knee, unspecified osteoarthritis type    ED Discharge Orders    None       Rosalio Loud 03/16/18 1840    Raeford Razor, MD 03/17/18 1140

## 2018-03-16 NOTE — Discharge Instructions (Signed)
Please read attached information. If you experience any new or worsening signs or symptoms please return to the emergency room for evaluation. Please follow-up with your primary care provider or specialist as discussed. Please use medication prescribed only as directed and discontinue taking if you have any concerning signs or symptoms.   °

## 2018-03-16 NOTE — ED Notes (Signed)
Pt verbalized understanding of discharge instructions and denies any further questions at this time.   

## 2018-03-16 NOTE — ED Notes (Signed)
Patient transported to X-ray 

## 2018-04-12 ENCOUNTER — Emergency Department (HOSPITAL_COMMUNITY)
Admission: EM | Admit: 2018-04-12 | Discharge: 2018-04-12 | Disposition: A | Discharge status: Home / Self Care | Payer: Medicare Other | Attending: Emergency Medicine | Admitting: Emergency Medicine

## 2018-04-12 ENCOUNTER — Encounter (HOSPITAL_COMMUNITY): Payer: Self-pay | Admitting: *Deleted

## 2018-04-12 ENCOUNTER — Emergency Department (HOSPITAL_COMMUNITY): Payer: Medicare Other

## 2018-04-12 DIAGNOSIS — S92535A Nondisplaced fracture of distal phalanx of left lesser toe(s), initial encounter for closed fracture: Secondary | ICD-10-CM | POA: Insufficient documentation

## 2018-04-12 DIAGNOSIS — S92502A Displaced unspecified fracture of left lesser toe(s), initial encounter for closed fracture: Secondary | ICD-10-CM

## 2018-04-12 DIAGNOSIS — W2209XA Striking against other stationary object, initial encounter: Secondary | ICD-10-CM | POA: Diagnosis not present

## 2018-04-12 DIAGNOSIS — I10 Essential (primary) hypertension: Secondary | ICD-10-CM | POA: Insufficient documentation

## 2018-04-12 DIAGNOSIS — J45909 Unspecified asthma, uncomplicated: Secondary | ICD-10-CM | POA: Insufficient documentation

## 2018-04-12 DIAGNOSIS — S99922A Unspecified injury of left foot, initial encounter: Secondary | ICD-10-CM | POA: Diagnosis present

## 2018-04-12 DIAGNOSIS — Y999 Unspecified external cause status: Secondary | ICD-10-CM | POA: Diagnosis not present

## 2018-04-12 DIAGNOSIS — Z79899 Other long term (current) drug therapy: Secondary | ICD-10-CM | POA: Diagnosis not present

## 2018-04-12 DIAGNOSIS — Y92003 Bedroom of unspecified non-institutional (private) residence as the place of occurrence of the external cause: Secondary | ICD-10-CM | POA: Diagnosis not present

## 2018-04-12 DIAGNOSIS — Y939 Activity, unspecified: Secondary | ICD-10-CM | POA: Insufficient documentation

## 2018-04-12 MED ORDER — HYDROCODONE-ACETAMINOPHEN 5-325 MG PO TABS: 1.0000 | ORAL_TABLET | Freq: Four times a day (QID) | ORAL | 0 refills | Status: DC | PRN

## 2018-04-12 MED ORDER — IBUPROFEN 200 MG PO TABS
600.0000 mg | ORAL_TABLET | Freq: Once | ORAL | Status: AC
Start: 2018-04-12 — End: 2018-04-12
  Administered 2018-04-12: 600 mg via ORAL
  Filled 2018-04-12: qty 1

## 2018-04-12 NOTE — Discharge Instructions (Signed)
Thank you for allowing me to care for you today in the Emergency Department.   Call the number on your discharge paperwork to get established with a primary care provider for follow-up.  For pain control, you can take 60 mg of ibuprofen with food or 650 mg of Tylenol/acetaminophen every 6 hours. For severe pain, you can take 1 tablet of hydrocodone acetaminophen every 6 hours.  This medication is a narcotic and can be addicting.  It also contains Tylenol.  You should not drive or work while taking this medication because it can cause you to be impaired.  As importantly do not take more than 4000 g of acetaminophen/Tylenol every 24 hours.  When you are sitting at home, elevate your leg so that your toes are at the level of your nose to help with pain and swelling.  Apply an ice pack for 15 to 20 minutes as frequently as needed to help with pain.  Use the postop shoe until swelling improves and you can wear your regular shoes comfortably.  Treatment for this type of fracture includes buddy taping your fourth and fifth toes together with a piece of gauze in between the toes.  Most fractures take 6 to 8 weeks for full healing.  Follow-up with primary care by calling the number on your discharge paperwork to get established with someone if your symptoms do not start to improve in the next few weeks.  Return to the emergency department if you develop significantly worsening symptoms including another fall or injury, if you develop fever or chills, if you develop red streaking up the toe and foot, if the tip of the toe turns blue, or if you develop other new, concerning symptoms.

## 2018-04-12 NOTE — ED Triage Notes (Signed)
Pt in stating she hit her 5th toe on her left foot last night and has been having pain ever since, ambulatory in room, no distress noted

## 2018-04-12 NOTE — ED Provider Notes (Signed)
[facility] EMERGENCY DEPARTMENT Provider Note   CSN: [id number] Arrival date & time: 04/12/18  1023     History   Chief Complaint No chief complaint on file.   HPI Emily Cochran is a 57 y.o. female with a history of hypertension, bipolar 1 disorder, asthma, and nephrolithiasis who presents to the emergency department with a chief complaint of left fifth toe pain that began at approximately 4 AM when she hit the toenail of the digit on her wooden bed frame.  She reports constant, sharp pain to the left fifth toe that radiates up her foot.  Pain is worse with bearing weight and with moving the toe.  No alleviating factors.  She denies numbness or weakness.  No history of previous injuries or surgeries to the left foot or toes.  No treatment prior to arrival.  Allergies include tramadol.  The history is provided by the patient. No language interpreter was used.    Past Medical History:  Diagnosis Date  . Asthma   . Bipolar 1 disorder (HCC)   . Hypertension   . Kidney stones     There are no active problems to display for this patient.   Past Surgical History:  Procedure Laterality Date  . ABDOMINAL HYSTERECTOMY    . APPENDECTOMY    . CHOLECYSTECTOMY       OB History   None      Home Medications    Prior to Admission medications   Medication Sig Start Date End Date Taking? Authorizing Provider  albuterol (PROVENTIL HFA;VENTOLIN HFA) 108 (90 Base) MCG/ACT inhaler Inhale 1-2 puffs into the lungs every 6 (six) hours as needed for wheezing or shortness of breath. 08/13/16   Rise Mu, PA-C  albuterol (PROVENTIL HFA;VENTOLIN HFA) 108 (90 Base) MCG/ACT inhaler Inhale 2 puffs into the lungs every 4 (four) hours as needed for wheezing or shortness of breath (cough). 08/27/16   Street, [place], PA-C  amoxicillin-clavulanate (AUGMENTIN) 875-125 MG tablet Take 1 tablet by mouth every 12 (twelve) hours. 02/28/18   Mardella Layman, MD  benzonatate  (TESSALON) 200 MG capsule Take 1 capsule (200 mg total) by mouth 3 (three) times daily as needed for cough. 08/27/16   Street, [place], PA-C  Diclofenac 18 MG CAPS Take 18 mg by mouth 3 (three) times daily as needed. Patient taking differently: Take 18 mg by mouth 3 (three) times daily as needed (pain).  05/25/16   Cheri Fowler, PA-C  Diphenhydramine-APAP, sleep, (GOODY PM PO) Take 1 Package by mouth daily as needed (pain).     [provider]  fluticasone (FLONASE) 50 MCG/ACT nasal spray Place 2 sprays into both nostrils daily. 08/13/16   Rise Mu, PA-C  guaiFENesin-codeine 100-10 MG/5ML syrup Take 5 mLs by mouth 3 (three) times daily as needed for cough. 08/13/16   Rise Mu, PA-C  guaiFENesin-dextromethorphan (ROBITUSSIN DM) 100-10 MG/5ML syrup Take 15 mLs by mouth every 4 (four) hours as needed for cough.    [provider]  HYDROcodone-acetaminophen (NORCO/VICODIN) 5-325 MG tablet Take 1 tablet by mouth every 6 (six) hours as needed. 04/12/18   Hamilton Marinello A, PA-C  ibuprofen (ADVIL,MOTRIN) 600 MG tablet Take 1 tablet (600 mg total) by mouth every 8 (eight) hours as needed for fever, headache, moderate pain or cramping. 08/27/16   Street, [place], PA-C  naproxen (NAPROSYN) 500 MG tablet Take 1 tablet (500 mg total) by mouth 2 (two) times daily with a meal. 02/28/18   Hagler, Arlys John,  MD  pregabalin (LYRICA) 75 MG capsule Take 75 mg by mouth 2 (two) times daily.    [provider]  promethazine-dextromethorphan (PROMETHAZINE-DM) 6.25-15 MG/5ML syrup Take 5 mLs 4 (four) times daily as needed by mouth for cough. 04/23/17   Zaylon Bossier A, PA-C  QUEtiapine (SEROQUEL XR) 400 MG 24 hr tablet Take 400 mg by mouth at bedtime.    [provider]  QUEtiapine (SEROQUEL) 200 MG tablet Take 200 mg by mouth 2 (two) times daily. 07/24/16   [provider]  sodium chloride (OCEAN) 0.65 % SOLN nasal spray Place 1 spray as needed into both nostrils for  congestion. 04/23/17   Lamonda Noxon, Coral Else, PA-C    Family History History reviewed. No pertinent family history.  Social History Social History   Tobacco Use  . Smoking status: Never Smoker  . Smokeless tobacco: Never Used  Substance Use Topics  . Alcohol use: No  . Drug use: No     Allergies   Coconut flavor; Shellfish allergy; Ultram [tramadol]; Aspirin; and Eggs or egg-derived products   Review of Systems Review of Systems  Constitutional: Negative for activity change, chills and fever.  Respiratory: Negative for shortness of breath.   Cardiovascular: Negative for chest pain.  Gastrointestinal: Negative for abdominal pain.  Musculoskeletal: Positive for arthralgias, gait problem, joint swelling and myalgias. Negative for back pain.  Skin: Negative for color change, rash and wound.  Neurological: Negative for weakness and numbness.     Physical Exam Updated Vital Signs BP 133/72 (BP Location: Right Arm)   Pulse 79   Temp 98.2 F (36.8 C) (Oral)   Resp 16   SpO2 99%   Physical Exam  Constitutional: No distress.  HENT:  Head: Normocephalic.  Eyes: Conjunctivae are normal.  Neck: Neck supple.  Cardiovascular: Normal rate and regular rhythm. Exam reveals no gallop and no friction rub.  No murmur heard. Pulmonary/Chest: Effort normal. No respiratory distress.  Abdominal: Soft. She exhibits no distension.  Musculoskeletal: She exhibits tenderness.  Tender to palpation to the left fifth toe.  Decreased range of motion of the digit secondary to pain.  No obvious deformity or crepitus.  Full active and passive range of motion of the bilateral feet with dorsiflexion plantarflexion.  DP and PT pulses are 2+ and symmetric.  Sensation is intact throughout.  Good capillary refill of the left fifth digit.  Neurological: She is alert.  Skin: Skin is warm. No rash noted.  Psychiatric: Her behavior is normal.  Nursing note and vitals reviewed.    ED Treatments / Results    Labs (all labs ordered are listed, but only abnormal results are displayed) Labs Reviewed - No data to display  EKG None  Radiology Dg Foot Complete Left  Result Date: 04/12/2018 CLINICAL DATA:  Posttraumatic left foot pain.  Initial encounter. EXAM: LEFT FOOT - COMPLETE 3+ VIEW COMPARISON:  None. FINDINGS: Nondisplaced fracture through the tuft of the fifth toe. No dislocation. No opaque foreign body. IMPRESSION: Nondisplaced tuft fracture of the fifth digit. Electronically Signed   By: Marnee Spring M.D.   On: 04/12/2018 11:07    Procedures Procedures (including critical care time)  Medications Ordered in ED Medications  ibuprofen (ADVIL,MOTRIN) tablet 600 mg (600 mg Oral Given 04/12/18 1108)     Initial Impression / Assessment and Plan / ED Course  I have reviewed the triage vital signs and the nursing notes.  Pertinent labs & imaging results that were available during my care of the  patient were reviewed by me and considered in my medical decision making (see chart for details).     Patient X-Ray with nondisplaced tuft fracture of the left fifth digit. Pain managed in ED. Pt advised to follow up with orthopedics for further evaluation and treatment.  Pain managed in the department. Patient given ibuprofen for pain.   I personally performed definitive fracture care while the patient was in the ED.   Conservative therapy recommended and discussed. She was given a post-op shoe. Patient will be dc home & is agreeable with above plan. A 56-month prescription history query was performed using the [place] CSRS prior to discharge.   I have also discussed reasons to return immediately to the ER.  Patient expresses understanding and agrees with plan.  Final Clinical Impressions(s) / ED Diagnoses   Final diagnoses:  Closed fracture of phalanx of left fifth toe, initial encounter    ED Discharge Orders         Ordered    HYDROcodone-acetaminophen (NORCO/VICODIN) 5-325 MG tablet   Every 6 hours PRN     04/12/18 1144           Lovetta Condie A, PA-C 04/12/18 1202    Alvira Monday, MD 04/12/18 [phone]

## 2018-04-17 ENCOUNTER — Other Ambulatory Visit: Payer: Self-pay

## 2018-04-17 ENCOUNTER — Encounter (HOSPITAL_COMMUNITY): Payer: Self-pay

## 2018-04-17 ENCOUNTER — Emergency Department (HOSPITAL_COMMUNITY): Payer: Medicare Other

## 2018-04-17 ENCOUNTER — Emergency Department (HOSPITAL_COMMUNITY)
Admission: EM | Admit: 2018-04-17 | Discharge: 2018-04-17 | Disposition: A | Discharge status: Home / Self Care | Payer: Medicare Other | Attending: Emergency Medicine | Admitting: Emergency Medicine

## 2018-04-17 DIAGNOSIS — R443 Hallucinations, unspecified: Secondary | ICD-10-CM

## 2018-04-17 DIAGNOSIS — D649 Anemia, unspecified: Secondary | ICD-10-CM | POA: Insufficient documentation

## 2018-04-17 DIAGNOSIS — G479 Sleep disorder, unspecified: Secondary | ICD-10-CM | POA: Insufficient documentation

## 2018-04-17 DIAGNOSIS — R1084 Generalized abdominal pain: Secondary | ICD-10-CM | POA: Diagnosis not present

## 2018-04-17 DIAGNOSIS — Z79899 Other long term (current) drug therapy: Secondary | ICD-10-CM | POA: Diagnosis not present

## 2018-04-17 DIAGNOSIS — I10 Essential (primary) hypertension: Secondary | ICD-10-CM | POA: Insufficient documentation

## 2018-04-17 DIAGNOSIS — R079 Chest pain, unspecified: Secondary | ICD-10-CM | POA: Diagnosis not present

## 2018-04-17 DIAGNOSIS — R441 Visual hallucinations: Secondary | ICD-10-CM | POA: Diagnosis not present

## 2018-04-17 DIAGNOSIS — R112 Nausea with vomiting, unspecified: Secondary | ICD-10-CM

## 2018-04-17 DIAGNOSIS — J45909 Unspecified asthma, uncomplicated: Secondary | ICD-10-CM | POA: Insufficient documentation

## 2018-04-17 DIAGNOSIS — R739 Hyperglycemia, unspecified: Secondary | ICD-10-CM | POA: Diagnosis not present

## 2018-04-17 DIAGNOSIS — K0889 Other specified disorders of teeth and supporting structures: Secondary | ICD-10-CM | POA: Diagnosis not present

## 2018-04-17 LAB — CBC WITH DIFFERENTIAL/PLATELET
Abs Immature Granulocytes: 0.04 10*3/uL (ref 0.00–0.07)
Basophils Absolute: 0 10*3/uL (ref 0.0–0.1)
Basophils Relative: 1 %
Eosinophils Absolute: 0.1 10*3/uL (ref 0.0–0.5)
Eosinophils Relative: 2 %
HCT: 39.6 % (ref 36.0–46.0)
Hemoglobin: 11.9 g/dL — ABNORMAL LOW (ref 12.0–15.0)
Immature Granulocytes: 1 %
Lymphocytes Relative: 40 %
Lymphs Abs: 3.3 10*3/uL (ref 0.7–4.0)
MCH: 22.8 pg — ABNORMAL LOW (ref 26.0–34.0)
MCHC: 30.1 g/dL (ref 30.0–36.0)
MCV: 76 fL — ABNORMAL LOW (ref 80.0–100.0)
Monocytes Absolute: 0.6 10*3/uL (ref 0.1–1.0)
Monocytes Relative: 7 %
Neutro Abs: 4.2 10*3/uL (ref 1.7–7.7)
Neutrophils Relative %: 49 %
Platelets: 346 10*3/uL (ref 150–400)
RBC: 5.21 MIL/uL — ABNORMAL HIGH (ref 3.87–5.11)
RDW: 14.1 % (ref 11.5–15.5)
WBC: 8.4 10*3/uL (ref 4.0–10.5)
nRBC: 0 % (ref 0.0–0.2)

## 2018-04-17 LAB — COMPREHENSIVE METABOLIC PANEL
ALT: 17 U/L (ref 0–44)
AST: 18 U/L (ref 15–41)
Albumin: 3.3 g/dL — ABNORMAL LOW (ref 3.5–5.0)
Alkaline Phosphatase: 95 U/L (ref 38–126)
Anion gap: 6 (ref 5–15)
BUN: 12 mg/dL (ref 6–20)
CO2: 28 mmol/L (ref 22–32)
Calcium: 8.5 mg/dL — ABNORMAL LOW (ref 8.9–10.3)
Chloride: 101 mmol/L (ref 98–111)
Creatinine, Ser: 0.71 mg/dL (ref 0.44–1.00)
GFR calc Af Amer: >60 mL/min (ref >60)
GFR calc non Af Amer: >60 mL/min (ref >60)
Glucose, Bld: 232 mg/dL — ABNORMAL HIGH (ref 70–99)
Potassium: 3.7 mmol/L (ref 3.5–5.1)
Sodium: 135 mmol/L (ref 135–145)
Total Bilirubin: 0.6 mg/dL (ref 0.3–1.2)
Total Protein: 6.8 g/dL (ref 6.5–8.1)

## 2018-04-17 LAB — LIPASE, BLOOD: Lipase: 41 U/L (ref 11–51)

## 2018-04-17 MED ORDER — ALUM & MAG HYDROXIDE-SIMETH 200-200-20 MG/5ML PO SUSP
30.0000 mL | Freq: Once | ORAL | Status: AC
  Administered 2018-04-17: 30 mL via ORAL
  Filled 2018-04-17: qty 30

## 2018-04-17 MED ORDER — ONDANSETRON 4 MG PO TBDP
8.0000 mg | ORAL_TABLET | Freq: Once | ORAL | Status: AC
  Administered 2018-04-17: 8 mg via ORAL
  Filled 2018-04-17: qty 2

## 2018-04-17 NOTE — Discharge Instructions (Addendum)
Your blood work was overall normal looking except for your blood sugar which was high (232) and your blood pressure was high Please see a primary doctor to have these rechecked Return if you are worsening

## 2018-04-17 NOTE — ED Provider Notes (Signed)
[facility] EMERGENCY DEPARTMENT Provider Note   CSN: [id number] Arrival date & time: 04/17/18  0302     History   Chief Complaint Chief Complaint  Patient presents with  . Abdominal Pain  . Nausea    HPI Emily Cochran is a 57 y.o. female who presents with abdominal pain, nausea, and hallucinations. PMH significant for asthma, bipolar d/o, HTN. The patient states that she was here on 11/6 because she broke her left pinky toe after stubbing it. She states that she has been doing well all week and this morning other than some dental pain she's been having. She took a Naproxen around 5PM and watched TV. Then around 8-9 PM she decided to take 2 Norco before bedtime because she states that usually she will just take 1 and she will wake up in pain. She started to have some chest pain which moved in to her abdomen. She felt like she needed to vomit but could not. She denies diarrhea or constipation. Then she tried to go lie down but every time she would close her eyes she would "see people". This kept happening and she couldn't sleep so she called EMS. Past surgical hx significant for hysterectomy, appendectomy, cholecystectomy.  HPI  Past Medical History:  Diagnosis Date  . Asthma   . Bipolar 1 disorder (HCC)   . Hypertension   . Kidney stones     There are no active problems to display for this patient.   Past Surgical History:  Procedure Laterality Date  . ABDOMINAL HYSTERECTOMY    . APPENDECTOMY    . CHOLECYSTECTOMY       OB History   None      Home Medications    Prior to Admission medications   Medication Sig Start Date End Date Taking? Authorizing Provider  albuterol (PROVENTIL HFA;VENTOLIN HFA) 108 (90 Base) MCG/ACT inhaler Inhale 1-2 puffs into the lungs every 6 (six) hours as needed for wheezing or shortness of breath. 08/13/16   Rise Mu, PA-C  albuterol (PROVENTIL HFA;VENTOLIN HFA) 108 (90 Base) MCG/ACT inhaler Inhale 2 puffs into  the lungs every 4 (four) hours as needed for wheezing or shortness of breath (cough). 08/27/16   Street, [place], PA-C  amoxicillin-clavulanate (AUGMENTIN) 875-125 MG tablet Take 1 tablet by mouth every 12 (twelve) hours. 02/28/18   Mardella Layman, MD  benzonatate (TESSALON) 200 MG capsule Take 1 capsule (200 mg total) by mouth 3 (three) times daily as needed for cough. 08/27/16   Street, [place], PA-C  Diclofenac 18 MG CAPS Take 18 mg by mouth 3 (three) times daily as needed. Patient taking differently: Take 18 mg by mouth 3 (three) times daily as needed (pain).  05/25/16   Cheri Fowler, PA-C  Diphenhydramine-APAP, sleep, (GOODY PM PO) Take 1 Package by mouth daily as needed (pain).     [provider]  fluticasone (FLONASE) 50 MCG/ACT nasal spray Place 2 sprays into both nostrils daily. 08/13/16   Rise Mu, PA-C  guaiFENesin-codeine 100-10 MG/5ML syrup Take 5 mLs by mouth 3 (three) times daily as needed for cough. 08/13/16   Rise Mu, PA-C  guaiFENesin-dextromethorphan (ROBITUSSIN DM) 100-10 MG/5ML syrup Take 15 mLs by mouth every 4 (four) hours as needed for cough.    [provider]  HYDROcodone-acetaminophen (NORCO/VICODIN) 5-325 MG tablet Take 1 tablet by mouth every 6 (six) hours as needed. 04/12/18   McDonald, Mia A, PA-C  ibuprofen (ADVIL,MOTRIN) 600 MG tablet Take 1 tablet (600 mg  total) by mouth every 8 (eight) hours as needed for fever, headache, moderate pain or cramping. 08/27/16   Street, [place], PA-C  naproxen (NAPROSYN) 500 MG tablet Take 1 tablet (500 mg total) by mouth 2 (two) times daily with a meal. 02/28/18   Mardella Layman, MD  pregabalin (LYRICA) 75 MG capsule Take 75 mg by mouth 2 (two) times daily.    [provider]  promethazine-dextromethorphan (PROMETHAZINE-DM) 6.25-15 MG/5ML syrup Take 5 mLs 4 (four) times daily as needed by mouth for cough. 04/23/17   McDonald, Mia A, PA-C  QUEtiapine (SEROQUEL XR) 400 MG 24 hr tablet Take 400 mg  by mouth at bedtime.    [provider]  QUEtiapine (SEROQUEL) 200 MG tablet Take 200 mg by mouth 2 (two) times daily. 07/24/16   [provider]  sodium chloride (OCEAN) 0.65 % SOLN nasal spray Place 1 spray as needed into both nostrils for congestion. 04/23/17   McDonald, Coral Else, PA-C    Family History History reviewed. No pertinent family history.  Social History Social History   Tobacco Use  . Smoking status: Never Smoker  . Smokeless tobacco: Never Used  Substance Use Topics  . Alcohol use: No  . Drug use: No     Allergies   Coconut flavor; Shellfish allergy; Ultram [tramadol]; Aspirin; and Eggs or egg-derived products   Review of Systems Review of Systems  Constitutional: Negative for fever.  HENT: Positive for dental problem.   Respiratory: Negative for shortness of breath.   Cardiovascular: Positive for chest pain (resolved).  Gastrointestinal: Positive for abdominal pain and nausea. Negative for diarrhea and vomiting.  Genitourinary: Negative for dysuria.  Musculoskeletal: Positive for arthralgias.  Psychiatric/Behavioral: Positive for hallucinations and sleep disturbance.  All other systems reviewed and are negative.    Physical Exam Updated Vital Signs Temp 98.2 F (36.8 C) (Oral)   Ht 5\' 2"  (1.575 m)   Wt 90.3 kg   SpO2 100%   BMI 36.40 kg/m   Physical Exam  Constitutional: She is oriented to person, place, and time. She appears well-developed and well-nourished. No distress.  Calm and cooperative. Pt reports visual hallucinations but is alert and responding appropriately  HENT:  Head: Normocephalic and atraumatic.  Eyes: Pupils are equal, round, and reactive to light. Conjunctivae are normal. Right eye exhibits no discharge. Left eye exhibits no discharge. No scleral icterus.  Neck: Normal range of motion.  Cardiovascular: Normal rate and regular rhythm.  Pulmonary/Chest: Effort normal and breath sounds normal. No respiratory  distress.  Abdominal: Soft. Bowel sounds are normal. She exhibits no distension and no mass. There is tenderness (diffuse tenderness). There is no rebound and no guarding. No hernia.  Old surgical scar over lower abdomen  Musculoskeletal:  Left foot: Tender over pinky toe. No swelling or bruising  Neurological: She is alert and oriented to person, place, and time.  Skin: Skin is warm and dry.  Psychiatric: She has a normal mood and affect. Her behavior is normal.  Nursing note and vitals reviewed.    ED Treatments / Results  Labs (all labs ordered are listed, but only abnormal results are displayed) Labs Reviewed  CBC WITH DIFFERENTIAL/PLATELET - Abnormal; Notable for the following components:      Result Value   RBC 5.21 (*)    Hemoglobin 11.9 (*)    MCV 76.0 (*)    MCH 22.8 (*)    All other components within normal limits  COMPREHENSIVE METABOLIC PANEL - Abnormal; Notable for the  following components:   Glucose, Bld 232 (*)    Calcium 8.5 (*)    Albumin 3.3 (*)    All other components within normal limits  LIPASE, BLOOD    EKG None  Radiology Dg Abd 2 Views  Result Date: 04/17/2018 CLINICAL DATA:  Acute onset of generalized abdominal pain and nausea. Visual hallucinations. EXAM: ABDOMEN - 2 VIEW COMPARISON:  None. FINDINGS: The visualized bowel gas pattern is unremarkable. Scattered air and stool filled loops of colon are seen; no abnormal dilatation of small bowel loops is seen to suggest small bowel obstruction. No free intra-abdominal air is identified, though evaluation for free air is limited on a single supine view. Clips are noted within the right upper quadrant, reflecting prior cholecystectomy. The visualized osseous structures are within normal limits; the sacroiliac joints are unremarkable in appearance. The visualized lung bases are essentially clear. IMPRESSION: Unremarkable bowel gas pattern; no free intra-abdominal air seen. Small to moderate amount of stool in  the colon. Electronically Signed   By: Roanna Raider M.D.   On: 04/17/2018 04:00    Procedures Procedures (including critical care time)  Medications Ordered in ED Medications  ondansetron (ZOFRAN-ODT) disintegrating tablet 8 mg (8 mg Oral Given 04/17/18 0353)  alum & mag hydroxide-simeth (MAALOX/MYLANTA) 200-200-20 MG/5ML suspension 30 mL (30 mLs Oral Given 04/17/18 0353)     Initial Impression / Assessment and Plan / ED Course  I have reviewed the triage vital signs and the nursing notes.  Pertinent labs & imaging results that were available during my care of the patient were reviewed by me and considered in my medical decision making (see chart for details).  57 year old female presents with abdominal pain, nausea, and hallucinations starting tonight. Unclear etiology. She took one extra Norco but wouldn't expect this to cause her symptoms. She is hypertensive but otherwise vitals are normal. Will check labs, UA, abdominal xray to r/o obstruction. She was given Maalox and zofran.  CBC is remarkable for mild anemia. CMP is remarkable for hyperglycemia (232). Pt states she is not diabetic. UA was cancelled since she doesn't have urinary symptoms. After meds she feels somewhat better. She tolerated PO. She says she is hallucinating but do not feel this is very significant. She is not responding to internal stimuli. Advised f/u with PCP for recheck of glucose and blood pressure.  Final Clinical Impressions(s) / ED Diagnoses   Final diagnoses:  Nausea & vomiting  Generalized abdominal pain  Hallucinations  Hyperglycemia    ED Discharge Orders    None       Bethel Born, PA-C 04/17/18 0515    Gilda Crease, MD 04/17/18 [phone]

## 2018-04-17 NOTE — ED Triage Notes (Signed)
Per EMS pt was taking norco and naprosyn together and had severe adb pain  And nausea. Pt also having visual hallucinations

## 2018-04-19 ENCOUNTER — Encounter: Payer: Self-pay | Admitting: Nurse Practitioner

## 2018-04-19 ENCOUNTER — Ambulatory Visit (INDEPENDENT_AMBULATORY_CARE_PROVIDER_SITE_OTHER): Payer: Medicare Other | Admitting: Nurse Practitioner

## 2018-04-19 VITALS — BP 134/84 | HR 66 | Temp 98.2°F | Ht 60.5 in | Wt 205.0 lb

## 2018-04-19 DIAGNOSIS — I10 Essential (primary) hypertension: Secondary | ICD-10-CM | POA: Diagnosis not present

## 2018-04-19 DIAGNOSIS — Z1212 Encounter for screening for malignant neoplasm of rectum: Secondary | ICD-10-CM

## 2018-04-19 DIAGNOSIS — F209 Schizophrenia, unspecified: Secondary | ICD-10-CM | POA: Diagnosis not present

## 2018-04-19 DIAGNOSIS — Z1231 Encounter for screening mammogram for malignant neoplasm of breast: Secondary | ICD-10-CM | POA: Diagnosis not present

## 2018-04-19 DIAGNOSIS — Z1211 Encounter for screening for malignant neoplasm of colon: Secondary | ICD-10-CM

## 2018-04-19 DIAGNOSIS — Z09 Encounter for follow-up examination after completed treatment for conditions other than malignant neoplasm: Secondary | ICD-10-CM | POA: Diagnosis not present

## 2018-04-19 DIAGNOSIS — Z1159 Encounter for screening for other viral diseases: Secondary | ICD-10-CM

## 2018-04-19 DIAGNOSIS — Z13228 Encounter for screening for other metabolic disorders: Secondary | ICD-10-CM

## 2018-04-19 DIAGNOSIS — Z113 Encounter for screening for infections with a predominantly sexual mode of transmission: Secondary | ICD-10-CM

## 2018-04-19 MED ORDER — AMLODIPINE BESYLATE 5 MG PO TABS
5.0000 mg | ORAL_TABLET | Freq: Every day | ORAL | 11 refills | Status: DC
Start: 2018-04-19 — End: 2019-05-29

## 2018-04-19 NOTE — Progress Notes (Signed)
Subjective:     Patient ID: Emily Cochran , female    DOB: 03-18-1961 , 57 y.o.   MRN: [id number]   Chief Complaint  Patient presents with  . Establish Care  . Hospitalization Follow-up    patient states her blood pressure and sugar was elevated.    HPI  Here to establish care - She was referred from the hospital after having some difficulty seeing. Lives alone.  3 children - healthy.  1 lives here and 2 in [place].  Single. Non-drinker, no illicit drugs.      She was hospitalized in [place] in 2011 with schizophrenia/bipolar - was going to [facility].  Visits the psychiatrist twice a year, taking Seroquel and Lyrica  PMH - fractured left 5th metatarsal.  2009 - mild heart attack, schizophrenia   [facility] - Mother - Bipolar/schizophrenia, maternal grandmother - lung cancer, paternal grandmother - breast cancer and colon cancer.  Father - mouth cancer (history of smoking).  Hypertension  This is a recurrent (She admits to eating sliced ham the same day she went to the ER. ) problem. The current episode started in the past 7 days. Pertinent negatives include no anxiety or chest pain. Agents associated with hypertension include NSAIDs. Risk factors for coronary artery disease include obesity and sedentary lifestyle. Past treatments include lifestyle changes. The current treatment provides mild improvement.     Past Medical History:  Diagnosis Date  . Asthma   . Bipolar 1 disorder (HCC)   . Hypertension   . Kidney stones      No family history on file.   Current Outpatient Medications:  .  albuterol (PROVENTIL HFA;VENTOLIN HFA) 108 (90 Base) MCG/ACT inhaler, Inhale 1-2 puffs into the lungs every 6 (six) hours as needed for wheezing or shortness of breath., Disp: 1 Inhaler, Rfl: 0 .  Diclofenac 18 MG CAPS, Take 18 mg by mouth 3 (three) times daily as needed. (Patient taking differently: Take 18 mg by mouth 3 (three) times daily as needed (pain). ), Disp: 12 capsule, Rfl:  0 .  Diphenhydramine-APAP, sleep, (GOODY PM PO), Take 1 Package by mouth daily as needed (pain). , Disp: , Rfl:  .  fluticasone (FLONASE) 50 MCG/ACT nasal spray, Place 2 sprays into both nostrils daily., Disp: 16 g, Rfl: 12 .  HYDROcodone-acetaminophen (NORCO/VICODIN) 5-325 MG tablet, Take 1 tablet by mouth every 6 (six) hours as needed., Disp: 10 tablet, Rfl: 0 .  naproxen (NAPROSYN) 500 MG tablet, Take 1 tablet (500 mg total) by mouth 2 (two) times daily with a meal., Disp: 20 tablet, Rfl: 0 .  pregabalin (LYRICA) 75 MG capsule, Take 75 mg by mouth 2 (two) times daily., Disp: , Rfl:  .  QUEtiapine (SEROQUEL) 200 MG tablet, Take 200 mg by mouth 2 (two) times daily., Disp: , Rfl: 0   Allergies  Allergen Reactions  . Coconut Flavor Shortness Of Breath and Swelling  . Shellfish Allergy Shortness Of Breath and Swelling    She can eat shrimp  . Ultram [Tramadol] Anaphylaxis    Pt REPORTS THROAT SWELLING  . Aspirin Swelling and Other (See Comments)    Angioedema, cuts on lips  . Eggs Or Egg-Derived Products Swelling     Review of Systems  HENT: Negative.   Respiratory: Negative.   Cardiovascular: Negative.  Negative for chest pain.  Musculoskeletal: Negative.   Psychiatric/Behavioral: Positive for behavioral problems. The patient is not nervous/anxious.        Hx bipolar/schizophrenia  Today's Vitals   04/19/18 1046  BP: 134/84  Pulse: 66  Temp: 98.2 F (36.8 C)  TempSrc: Oral  SpO2: 97%  Weight: 205 lb (93 kg)  Height: 5' 0.5" (1.537 m)  PainSc: 10-Worst pain ever  PainLoc: Toe   Body mass index is 39.38 kg/m.   Objective:  Physical Exam  Constitutional: She appears well-developed and well-nourished.  Cardiovascular: Normal rate, regular rhythm and normal heart sounds.  Pulmonary/Chest: Effort normal and breath sounds normal.  Abdominal: Soft. Bowel sounds are normal. She exhibits no distension and no mass. There is no tenderness.  Skin: Skin is warm and dry.   Psychiatric: She has a normal mood and affect.         Assessment And Plan:     1. Schizophrenia, unspecified type (HCC)  She has a long history with previous hospitalizations. Currently stable per patient.  2. Essential hypertension  Chronic, controlled  Continue with current medications - CBC with Diff - CMP14 + Anion Gap - amLODipine (NORVASC) 5 MG tablet; Take 1 tablet (5 mg total) by mouth daily.  Dispense: 30 tablet; Refill: 11  3. Encounter for screening mammogram for breast cancer  She has not had a mammogram in many years per patient  Instructed on Self Breast Exam.According to ACOG guidelines Women aged 57 and older are recommended to get an annual mammogram.   Mammogram order placed and advised will receive a call from [facility] for appointment scheduling.   Encouraged to get annual mammogram - MM Digital Screening; Future  4. Encounter for screening for metabolic disorder - Hemoglobin A1c - TSH - Vitamin D (25 hydroxy) - Lipid panel  5. Encounter for screening for colorectal malignant neoplasm - Ambulatory referral to Gastroenterology  6. Encounter for hepatitis C screening test for low risk patient - Hepatitis C antibody  7. Encounter for screening examination for sexually transmitted disease  - HIV antibody (with reflex)      Arnette FeltsJanece Yanis Larin, FNP

## 2018-04-20 ENCOUNTER — Other Ambulatory Visit: Payer: Self-pay | Admitting: Nurse Practitioner

## 2018-04-20 DIAGNOSIS — E119 Type 2 diabetes mellitus without complications: Secondary | ICD-10-CM

## 2018-04-20 DIAGNOSIS — E559 Vitamin D deficiency, unspecified: Secondary | ICD-10-CM

## 2018-04-20 DIAGNOSIS — R946 Abnormal results of thyroid function studies: Secondary | ICD-10-CM

## 2018-04-20 LAB — HIV ANTIBODY (ROUTINE TESTING W REFLEX): HIV Screen 4th Generation wRfx: NONREACTIVE

## 2018-04-20 LAB — TSH: TSH: 0.425 u[IU]/mL — ABNORMAL LOW (ref 0.450–4.500)

## 2018-04-20 LAB — CBC WITH DIFFERENTIAL/PLATELET
Basophils Absolute: 0 10*3/uL (ref 0.0–0.2)
Basos: 0 %
EOS (ABSOLUTE): 0.2 10*3/uL (ref 0.0–0.4)
Eos: 3 %
Hematocrit: 35.9 % (ref 34.0–46.6)
Hemoglobin: 11.4 g/dL (ref 11.1–15.9)
Immature Grans (Abs): 0 10*3/uL (ref 0.0–0.1)
Immature Granulocytes: 0 %
Lymphocytes Absolute: 2 10*3/uL (ref 0.7–3.1)
Lymphs: 33 %
MCH: 23.3 pg — ABNORMAL LOW (ref 26.6–33.0)
MCHC: 31.8 g/dL (ref 31.5–35.7)
MCV: 73 fL — ABNORMAL LOW (ref 79–97)
Monocytes Absolute: 0.3 10*3/uL (ref 0.1–0.9)
Monocytes: 5 %
Neutrophils Absolute: 3.5 10*3/uL (ref 1.4–7.0)
Neutrophils: 59 %
Platelets: 366 10*3/uL (ref 150–450)
RBC: 4.9 x10E6/uL (ref 3.77–5.28)
RDW: 14.3 % (ref 12.3–15.4)
WBC: 6 10*3/uL (ref 3.4–10.8)

## 2018-04-20 LAB — CMP14 + ANION GAP
ALT: 14 IU/L (ref 0–32)
AST: 17 IU/L (ref 0–40)
Albumin/Globulin Ratio: 1.3 (ref 1.2–2.2)
Albumin: 3.8 g/dL (ref 3.5–5.5)
Alkaline Phosphatase: 112 IU/L (ref 39–117)
Anion Gap: 15 mmol/L (ref 10.0–18.0)
BUN/Creatinine Ratio: 14 (ref 9–23)
BUN: 10 mg/dL (ref 6–24)
Bilirubin Total: 0.4 mg/dL (ref 0.0–1.2)
CO2: 24 mmol/L (ref 20–29)
Calcium: 9.1 mg/dL (ref 8.7–10.2)
Chloride: 101 mmol/L (ref 96–106)
Creatinine, Ser: 0.73 mg/dL (ref 0.57–1.00)
GFR calc Af Amer: 106 mL/min/{1.73_m2} (ref >59)
GFR calc non Af Amer: 92 mL/min/{1.73_m2} (ref >59)
Globulin, Total: 2.9 g/dL (ref 1.5–4.5)
Glucose: 211 mg/dL — ABNORMAL HIGH (ref 65–99)
Potassium: 4.1 mmol/L (ref 3.5–5.2)
Sodium: 140 mmol/L (ref 134–144)
Total Protein: 6.7 g/dL (ref 6.0–8.5)

## 2018-04-20 LAB — LIPID PANEL
Chol/HDL Ratio: 3.8 ratio (ref 0.0–4.4)
Cholesterol, Total: 176 mg/dL (ref 100–199)
HDL: 46 mg/dL (ref >39)
LDL Calculated: 112 mg/dL — ABNORMAL HIGH (ref 0–99)
Triglycerides: 90 mg/dL (ref 0–149)
VLDL Cholesterol Cal: 18 mg/dL (ref 5–40)

## 2018-04-20 LAB — VITAMIN D 25 HYDROXY (VIT D DEFICIENCY, FRACTURES): Vit D, 25-Hydroxy: 21 ng/mL — ABNORMAL LOW (ref 30.0–100.0)

## 2018-04-20 LAB — HEMOGLOBIN A1C
Est. average glucose Bld gHb Est-mCnc: 206 mg/dL
Hgb A1c MFr Bld: 8.8 % — ABNORMAL HIGH (ref 4.8–5.6)

## 2018-04-20 LAB — HEPATITIS C ANTIBODY: Hep C Virus Ab: <0.1 s/co ratio (ref 0.0–0.9)

## 2018-04-20 MED ORDER — VITAMIN D (ERGOCALCIFEROL) 1.25 MG (50000 UNIT) PO CAPS: 50000.0000 [IU] | ORAL_CAPSULE | ORAL | 2 refills | Status: DC

## 2018-04-20 MED ORDER — METOPROLOL TARTRATE 25 MG PO TABS: 25.0000 mg | ORAL_TABLET | Freq: Two times a day (BID) | ORAL | 11 refills | Status: DC

## 2018-04-20 MED ORDER — BLOOD GLUCOSE MONITOR KIT: PACK | 0 refills | Status: DC

## 2018-04-20 MED ORDER — METFORMIN HCL 500 MG PO TABS: 500.0000 mg | ORAL_TABLET | Freq: Two times a day (BID) | ORAL | 11 refills | Status: DC

## 2018-04-28 ENCOUNTER — Other Ambulatory Visit: Payer: Self-pay

## 2018-04-28 ENCOUNTER — Ambulatory Visit (INDEPENDENT_AMBULATORY_CARE_PROVIDER_SITE_OTHER): Payer: Medicare Other | Admitting: Nurse Practitioner

## 2018-04-28 ENCOUNTER — Encounter: Payer: Self-pay | Admitting: Nurse Practitioner

## 2018-04-28 VITALS — BP 126/78 | HR 56 | Temp 98.1°F | Ht 60.5 in | Wt 205.4 lb

## 2018-04-28 DIAGNOSIS — I10 Essential (primary) hypertension: Secondary | ICD-10-CM | POA: Insufficient documentation

## 2018-04-28 DIAGNOSIS — R1310 Dysphagia, unspecified: Secondary | ICD-10-CM

## 2018-04-28 DIAGNOSIS — F209 Schizophrenia, unspecified: Secondary | ICD-10-CM | POA: Insufficient documentation

## 2018-04-28 LAB — T4: T4, Total: 8.2 ug/dL (ref 4.5–12.0)

## 2018-04-28 LAB — T3, FREE: T3, Free: 3.1 pg/mL (ref 2.0–4.4)

## 2018-04-28 LAB — SPECIMEN STATUS REPORT

## 2018-04-28 MED ORDER — SEMAGLUTIDE(0.25 OR 0.5MG/DOS) 2 MG/1.5ML ~~LOC~~ SOPN: 0.5000 mg | PEN_INJECTOR | SUBCUTANEOUS | 1 refills | Status: DC

## 2018-04-28 MED ORDER — ALBUTEROL SULFATE HFA 108 (90 BASE) MCG/ACT IN AERS: 1.0000 | INHALATION_SPRAY | Freq: Four times a day (QID) | RESPIRATORY_TRACT | 3 refills | Status: DC | PRN

## 2018-04-28 NOTE — Progress Notes (Signed)
Subjective:     Patient ID: Emily Cochran , female    DOB: Dec 22, 1960 , 57 y.o.   MRN: [id number]   Chief Complaint  Patient presents with  . Feels like something in throat    HPI  She reports this has been occurring for over one year.  She has been treated for reflux. She can swallow and will feel like something is coming back up.  She is feeling like she has a foul odor from her throat.  Report she had been exposed to mildew over a year ago.     Past Medical History:  Diagnosis Date  . Asthma   . Bipolar 1 disorder ([place])   . Hypertension   . Kidney stones      Family History  Problem Relation Age of Onset  . Bipolar disorder Mother   . Schizophrenia Mother   . Diabetes Mother   . Cancer Mother   . Hypertension Mother   . Cancer Father   . Diabetes Father      Current Outpatient Medications:  .  ACCU-CHEK GUIDE test strip, CHECK BLOOD SUGAR UP TO 4 TIMES DAILY, Disp: , Rfl: 0 .  albuterol (PROVENTIL HFA;VENTOLIN HFA) 108 (90 Base) MCG/ACT inhaler, Inhale 1-2 puffs into the lungs every 6 (six) hours as needed for wheezing or shortness of breath., Disp: 1 Inhaler, Rfl: 3 .  amLODipine (NORVASC) 5 MG tablet, Take 1 tablet (5 mg total) by mouth daily., Disp: 30 tablet, Rfl: 11 .  amoxicillin (AMOXIL) 500 MG capsule, TAKE 1 CAPSULE BY MOUTH TID FOR SEVEN DAYS, Disp: , Rfl: 0 .  blood glucose meter kit and supplies KIT, Dispense based on patient and insurance preference. Use up to four times daily as directed. (FOR ICD-10 E11.65)., Disp: 1 each, Rfl: 0 .  Diphenhydramine-APAP, sleep, (GOODY PM PO), Take 1 Package by mouth daily as needed (pain). , Disp: , Rfl:  .  fluticasone (FLONASE) 50 MCG/ACT nasal spray, Place 2 sprays into both nostrils daily., Disp: 16 g, Rfl: 12 .  metoprolol tartrate (LOPRESSOR) 25 MG tablet, Take 1 tablet (25 mg total) by mouth 2 (two) times daily., Disp: 60 tablet, Rfl: 11 .  pregabalin (LYRICA) 75 MG capsule, Take 75 mg by mouth daily. , Disp: ,  Rfl:  .  QUEtiapine (SEROQUEL) 200 MG tablet, Take 200 mg by mouth at bedtime. , Disp: , Rfl: 0 .  Semaglutide,0.25 or 0.5MG/DOS, (OZEMPIC, 0.25 OR 0.5 MG/DOSE,) 2 MG/1.5ML SOPN, Inject 0.5 mg into the skin once a week., Disp: 1 pen, Rfl: 1 .  Vitamin D, Ergocalciferol, (DRISDOL) 1.25 MG (50000 UT) CAPS capsule, Take 1 capsule (50,000 Units total) by mouth every 7 (seven) days., Disp: 4 capsule, Rfl: 2   Allergies  Allergen Reactions  . Coconut Flavor Shortness Of Breath and Swelling  . Shellfish Allergy Shortness Of Breath and Swelling    She can eat shrimp  . Ultram [Tramadol] Anaphylaxis    Pt REPORTS THROAT SWELLING  . Aspirin Swelling and Other (See Comments)    Angioedema, cuts on lips  . Eggs Or Egg-Derived Products Swelling     Review of Systems  Constitutional: Negative.   HENT: Positive for trouble swallowing.   Respiratory: Negative.   Cardiovascular: Negative.   Gastrointestinal: Negative.   Skin: Negative.   Neurological: Negative.      Today's Vitals   04/28/18 1139  BP: 126/78  Pulse: (!) 56  Temp: 98.1 F (36.7 C)  TempSrc: Oral  SpO2:  96%  Weight: 205 lb 6.4 oz (93.2 kg)  Height: 5' 0.5" (1.537 m)   Body mass index is 39.45 kg/m.   Objective:  Physical Exam  Constitutional: She appears well-developed and well-nourished.  HENT:  Head: Normocephalic.  Neck: Normal range of motion. Neck supple. No thyromegaly present.  Cardiovascular: Normal rate, regular rhythm and normal heart sounds.  Pulmonary/Chest: Effort normal and breath sounds normal. She has no wheezes. She has no rales.  Skin: Skin is warm and dry. Capillary refill takes less than 2 seconds.        Assessment And Plan:     1. Dysphagia, unspecified type  No abnormal findings on physical examination  Will refer to ENT for further evaluation due to length of time and antireflux medications were ineffective.     [name], FNP

## 2018-05-03 NOTE — Progress Notes (Signed)
Name: Emily Cochran  MRN/ DOB: [id number], September 22, 1960    Age/ Sex: 57 y.o., female    PCP: Patient, No Pcp Per   Reason for Endocrinology Evaluation: Abnormal TFT's     Date of Initial Endocrinology Evaluation: 05/09/2018     HPI: Ms. RISE TRAEGER is a 57 y.o. female with a past medical history of HTN, Bipolar d/o, T2DM and Asthma. The patient presented for initial endocrinology clinic visit on 05/09/2018 for consultative assistance with her low TSH .   She was noted to have a low TSH on labs in November, 2019. She does feel that her neck has gotten larger in the past 9 months, she attributes this to fungus, the neck size has been stable, she also endorses sore throat and dysphagia for the past 9 months, her dentist believes her halitosis is related to throat issues per patient, she is being referred by PCP for ENT evaluation, pt also noted sob when she lays down, she also endorses intermittent voice hoarseness.   Mother with thyroid and disease.    She was diagnosed with DM a few weeks ago. She was started on Metformin but pt stopped it due to weight gain, she is currently on Ozempic which she is tolerating very well.  She has lost weight since being on Ozempic.  She does have chest pains, palpitations , chronic cough and sob that seem to be a chronic issue in the past 9 months.  She does have intermittent depression but denies anxiety or jittery sensation.  She also has hand tingling and numbness.     HISTORY:  Past Medical History:  Past Medical History:  Diagnosis Date  . Asthma   . Bipolar 1 disorder ([place])   . Hypertension   . Kidney stones    Past Surgical History:  Past Surgical History:  Procedure Laterality Date  . ABDOMINAL HYSTERECTOMY    . APPENDECTOMY    . CHOLECYSTECTOMY        Social History:  reports that she has never smoked. She has never used smokeless tobacco. She reports that she does not drink alcohol or use drugs.  Family History: family  history includes Bipolar disorder in her mother; Cancer in her father and mother; Diabetes in her father and mother; Hypertension in her mother; Schizophrenia in her mother.   HOME MEDICATIONS: Current Outpatient Medications on File Prior to Visit  Medication Sig Dispense Refill  . ACCU-CHEK GUIDE test strip CHECK BLOOD SUGAR UP TO 4 TIMES DAILY  0  . albuterol (PROVENTIL HFA;VENTOLIN HFA) 108 (90 Base) MCG/ACT inhaler Inhale 1-2 puffs into the lungs every 6 (six) hours as needed for wheezing or shortness of breath. 1 Inhaler 3  . amLODipine (NORVASC) 5 MG tablet Take 1 tablet (5 mg total) by mouth daily. 30 tablet 11  . blood glucose meter kit and supplies KIT Dispense based on patient and insurance preference. Use up to four times daily as directed. (FOR ICD-10 E11.65). 1 each 0  . Diphenhydramine-APAP, sleep, (GOODY PM PO) Take 1 Package by mouth daily as needed (pain).     . fluticasone (FLONASE) 50 MCG/ACT nasal spray Place 2 sprays into both nostrils daily. 16 g 12  . metoprolol tartrate (LOPRESSOR) 25 MG tablet Take 1 tablet (25 mg total) by mouth 2 (two) times daily. 60 tablet 11  . pregabalin (LYRICA) 75 MG capsule Take 75 mg by mouth daily.     . Semaglutide,0.25 or 0.5MG/DOS, (OZEMPIC, 0.25 OR 0.5 MG/DOSE,)  2 MG/1.5ML SOPN Inject 0.5 mg into the skin once a week. 1 pen 1  . Vitamin D, Ergocalciferol, (DRISDOL) 1.25 MG (50000 UT) CAPS capsule Take 1 capsule (50,000 Units total) by mouth every 7 (seven) days. 4 capsule 2  . amoxicillin (AMOXIL) 500 MG capsule TAKE 1 CAPSULE BY MOUTH TID FOR SEVEN DAYS  0  . QUEtiapine (SEROQUEL) 200 MG tablet Take 200 mg by mouth at bedtime.   0   No current facility-administered medications on file prior to visit.       REVIEW OF SYSTEMS: A comprehensive ROS was conducted with the patient and is negative except as per HPI and below:  Review of Systems  HENT: Positive for congestion and sore throat.   Respiratory: Positive for cough. Negative for  shortness of breath.   Cardiovascular: Positive for palpitations. Negative for chest pain.  Gastrointestinal: Positive for heartburn.  Genitourinary: Negative for frequency.  Neurological: Positive for tingling. Negative for tremors.  Endo/Heme/Allergies: Negative for polydipsia.  Psychiatric/Behavioral: Negative for depression. The patient is not nervous/anxious.        OBJECTIVE:  VS: BP 128/68 (BP Location: Right Arm, Patient Position: Sitting, Cuff Size: Normal)   Pulse 72   Ht 5' (1.524 m)   Wt 201 lb 9.6 oz (91.4 kg)   SpO2 99%   BMI 39.37 kg/m    Wt Readings from Last 3 Encounters:  05/09/18 201 lb 9.6 oz (91.4 kg)  04/28/18 205 lb 6.4 oz (93.2 kg)  04/19/18 205 lb (93 kg)     EXAM: General: Pt appears well and is in NAD  Hydration: Well-hydrated with moist mucous membranes and good skin turgor  Eyes: External eye exam normal without stare, lid lag or exophthalmos.  EOM intact.   Ears, Nose, Throat: Hearing: Grossly intact bilaterally Dental: Upper dentures Throat: Clear without mass, erythema or exudate  Neck: General: Supple without adenopathy. Thyroid: Thyroid size normal.  No goiter or nodules appreciated. No thyroid bruit. + tenderness noted   Lungs: Clear with good BS bilat with no rales, rhonchi, or wheezes  Heart: Auscultation: RRR.  Abdomen: Normoactive bowel sounds, soft, nontender, without masses or organomegaly palpable  Extremities: BL LE: No pretibial edema normal ROM and strength.  Skin: Hair: Texture and amount normal with gender appropriate distribution Skin Inspection: No rashes, acanthosis nigricans/skin tags.  Skin Palpation: Skin temperature, texture, and thickness normal to palpation  Neuro: Cranial nerves: II - XII grossly intact  Motor: Normal strength throughout DTRs: 2+ and symmetric in UE without delay in relaxation phase  Mental Status: Judgment, insight: Intact Orientation: Oriented to time, place, and person Mood and affect: No  depression, anxiety, or agitation     DATA REVIEWED:   Results for EMMALEA, TREANOR (MRN [id number]) as of 05/03/2018 07:54  Ref. Range 04/19/2018 11:29  TSH Latest Ref Range: 0.450 - 4.500 uIU/mL 0.425 (L)  Triiodothyronine,Free,Serum Latest Ref Range: 2.0 - 4.4 pg/mL 3.1  Thyroxine (T4) Latest Ref Range: 4.5 - 12.0 ug/dL 8.2  Results for STEPHANIEANN, POPESCU (MRN [id number]) as of 05/09/2018 14:25  Ref. Range 05/09/2018 09:35  TSH Latest Ref Range: 0.35 - 4.50 uIU/mL 0.90  T4,Free(Direct) Latest Ref Range: 0.60 - 1.60 ng/dL 0.85     ASSESSMENT/PLAN/RECOMMENDATIONS:   1. Low TSH :    - Discussed D/D of subclinical thyroiditis, vs subclinical graves' disease vs autonomous thyroid nodule (s) - On exam today I don't feel any thyroid enlargement or nodules, but she does have noticeable tenderness which  is more consistent with subacute thyroiditis.  - Repeat TFt's today are normal, will obtain TRAb levels and proceed from there prior to obtaining any additional imaging at this time.  - In the mean time she can use Tylenol for the pain. - She has a lot of non-specific symptoms related to her throat , that are not related to her thyroid.  - Pt may benefit from GI evaluation given her heartburn issues, dysphagia and positional sob.     F/U in 6 weeks   Signed electronically by: Mack Guise, MD  [facility]  [address] [address] [place], [place] [address] Phone: [phone] FAX: [phone]   CC: Patient, No Pcp Per No address on file Phone: None Fax: None   Return to Endocrinology clinic as below: Future Appointments  Date Time Provider [place]  06/08/2018  8:45 AM [name], FNP [facility] None  06/20/2018  2:00 PM Shamleffer, Melanie Crazier, MD [facility] None

## 2018-05-09 ENCOUNTER — Encounter: Payer: Self-pay | Admitting: Internal Medicine

## 2018-05-09 ENCOUNTER — Ambulatory Visit (INDEPENDENT_AMBULATORY_CARE_PROVIDER_SITE_OTHER): Payer: Medicare Other | Admitting: Internal Medicine

## 2018-05-09 VITALS — BP 128/68 | HR 72 | Ht 60.0 in | Wt 201.6 lb

## 2018-05-09 DIAGNOSIS — R7989 Other specified abnormal findings of blood chemistry: Secondary | ICD-10-CM

## 2018-05-09 LAB — TSH: TSH: 0.9 u[IU]/mL (ref 0.35–4.50)

## 2018-05-09 LAB — T4, FREE: Free T4: 0.85 ng/dL (ref 0.60–1.60)

## 2018-05-09 NOTE — Patient Instructions (Signed)
-   Your thyroid gland was a little bit overactive on the last blood test , we will repeat your labs today and contact you with any abnormality.  - Please take Tylenol for the pain

## 2018-05-13 LAB — THYROTROPIN RECEPTOR AUTOABS: Thyrotropin Receptor Ab: <6 % (ref <=16.0)

## 2018-05-13 LAB — T3: T3, Total: 143 ng/dL (ref 76–181)

## 2018-06-08 ENCOUNTER — Ambulatory Visit: Payer: Medicare Other | Admitting: Nurse Practitioner

## 2018-06-10 ENCOUNTER — Other Ambulatory Visit: Payer: Self-pay | Admitting: Nurse Practitioner

## 2018-06-10 DIAGNOSIS — R1310 Dysphagia, unspecified: Secondary | ICD-10-CM

## 2018-06-20 ENCOUNTER — Ambulatory Visit: Payer: Medicare Other | Admitting: Internal Medicine

## 2018-06-28 ENCOUNTER — Ambulatory Visit: Payer: Medicare Other | Admitting: Internal Medicine

## 2018-07-03 ENCOUNTER — Other Ambulatory Visit: Payer: Self-pay | Admitting: Nurse Practitioner

## 2018-07-04 ENCOUNTER — Telehealth: Payer: Self-pay | Admitting: General Practice

## 2018-07-04 NOTE — Telephone Encounter (Signed)
Called to schedule Medicare Annual Wellness Visit.  Patient states she moved to [place], [place] on 05/10/2018.  She states she is trying to find a PCP there in [place].  Trixie Rude, Care Guide.

## 2018-08-17 ENCOUNTER — Other Ambulatory Visit: Payer: Self-pay | Admitting: Nurse Practitioner

## 2018-08-28 ENCOUNTER — Ambulatory Visit: Payer: Self-pay | Admitting: Nurse Practitioner

## 2018-09-19 ENCOUNTER — Ambulatory Visit (INDEPENDENT_AMBULATORY_CARE_PROVIDER_SITE_OTHER): Payer: Medicare Other | Admitting: Nurse Practitioner

## 2018-09-19 ENCOUNTER — Other Ambulatory Visit: Payer: Self-pay

## 2018-09-19 ENCOUNTER — Encounter: Payer: Self-pay | Admitting: Nurse Practitioner

## 2018-09-19 VITALS — BP 120/78

## 2018-09-19 DIAGNOSIS — E559 Vitamin D deficiency, unspecified: Secondary | ICD-10-CM | POA: Diagnosis not present

## 2018-09-19 DIAGNOSIS — I10 Essential (primary) hypertension: Secondary | ICD-10-CM

## 2018-09-19 DIAGNOSIS — E119 Type 2 diabetes mellitus without complications: Secondary | ICD-10-CM

## 2018-09-19 DIAGNOSIS — M797 Fibromyalgia: Secondary | ICD-10-CM | POA: Diagnosis not present

## 2018-09-19 MED ORDER — PREGABALIN 75 MG PO CAPS: 75.0000 mg | ORAL_CAPSULE | Freq: Every day | ORAL | 2 refills | Status: DC

## 2018-09-19 NOTE — Progress Notes (Signed)
This visit type was conducted due to national recommendations for restrictions regarding the COVID-19 Pandemic (e.g. social distancing).  This format is felt to be most appropriate for this patient at this time.  All issues noted in this document were discussed and addressed.  No physical exam was performed (except for noted visual exam findings with Video Visits).  Please refer to the patient's chart (MyChart message for video visits and phone note for telephone visits) for the patient's consent to telehealth for [place].   Subjective:     Patient ID: Emily Cochran , female    DOB: [birth year] , 58 y.o.   MRN: [id number]  Virtual Visit via [email or web address] Note  I connected with Elaina Hoops on 09/19/18 at  2:15 PM EDT by telephone and verified that I am speaking with the correct person using two identifiers.   Provider location: At home Patient location: At home  I discussed the limitations, risks, security and privacy concerns of performing an evaluation and management service by telephone and the availability of in person appointments. I also discussed with the patient that there may be a patient responsible charge related to this service. The patient expressed understanding and agreed to proceed.  Chief Complaint  Patient presents with  . Diabetes    History of Present Illness:   She is currently living in [place].    Diabetes  She presents for her follow-up diabetic visit. She has type 2 diabetes mellitus. Her disease course has been worsening. Associated symptoms include chest pain. Pertinent negatives for diabetes include no blurred vision, no fatigue, no polydipsia, no polyphagia and no polyuria. Symptoms are stable. Risk factors for coronary artery disease include diabetes mellitus. Current diabetic treatment includes oral agent (dual therapy). She is compliant with treatment most of the time. She has not had a previous visit with a dietitian. She rarely participates in exercise.  (100 - 130 blood sugar) An ACE inhibitor/angiotensin II receptor blocker is being taken. Eye exam is not current.     Past Medical History:  Diagnosis Date  . Asthma   . Bipolar 1 disorder ([place])   . Hypertension   . Kidney stones      Family History  Problem Relation Age of Onset  . Bipolar disorder Mother   . Schizophrenia Mother   . Diabetes Mother   . Cancer Mother   . Hypertension Mother   . Cancer Father   . Diabetes Father      Current Outpatient Medications:  .  ACCU-CHEK GUIDE test strip, CHECK BLOOD SUGAR UP TO 4 TIMES DAILY, Disp: , Rfl: 0 .  ACCU-CHEK SOFTCLIX LANCETS lancets, CHECK BLOOD SUGAR UP TO 4 TIMES DAILY, Disp: 300 each, Rfl: 1 .  albuterol (PROVENTIL HFA;VENTOLIN HFA) 108 (90 Base) MCG/ACT inhaler, Inhale 1-2 puffs into the lungs every 6 (six) hours as needed for wheezing or shortness of breath., Disp: 1 Inhaler, Rfl: 3 .  amLODipine (NORVASC) 5 MG tablet, Take 1 tablet (5 mg total) by mouth daily., Disp: 30 tablet, Rfl: 11 .  blood glucose meter kit and supplies KIT, Dispense based on patient and insurance preference. Use up to four times daily as directed. (FOR ICD-10 E11.65)., Disp: 1 each, Rfl: 0 .  fluticasone (FLONASE) 50 MCG/ACT nasal spray, Place 2 sprays into both nostrils daily., Disp: 16 g, Rfl: 12 .  metoprolol tartrate (LOPRESSOR) 25 MG tablet, Take 1 tablet (25 mg total) by mouth 2 (two) times daily., Disp: 60 tablet, Rfl: 11 .  OZEMPIC, 0.25 OR 0.5 MG/DOSE, 2 MG/1.5ML SOPN, INJECT 0.5 MG INTO THE SKIN ONCE A WEEK, Disp: 1.5 mL, Rfl: 0 .  pregabalin (LYRICA) 75 MG capsule, Take 75 mg by mouth daily. , Disp: , Rfl:  .  QUEtiapine (SEROQUEL) 200 MG tablet, Take 200 mg by mouth at bedtime. , Disp: , Rfl: 0 .  amoxicillin (AMOXIL) 500 MG capsule, TAKE 1 CAPSULE BY MOUTH TID FOR SEVEN DAYS, Disp: , Rfl: 0 .  Diphenhydramine-APAP, sleep, (GOODY PM PO), Take 1 Package by mouth daily as needed (pain). , Disp: , Rfl:  .  Vitamin D, Ergocalciferol,  (DRISDOL) 1.25 MG (50000 UT) CAPS capsule, Take 1 capsule (50,000 Units total) by mouth every 7 (seven) days. (Patient not taking: Reported on 09/19/2018), Disp: 4 capsule, Rfl: 2   Allergies  Allergen Reactions  . Coconut Flavor Shortness Of Breath and Swelling  . Shellfish Allergy Shortness Of Breath and Swelling    She can eat shrimp  . Ultram [Tramadol] Anaphylaxis    Pt REPORTS THROAT SWELLING  . Aspirin Swelling and Other (See Comments)    Angioedema, cuts on lips  . Eggs Or Egg-Derived Products Swelling     Review of Systems  Constitutional: Negative for fatigue.  Eyes: Negative for blurred vision.  Cardiovascular: Positive for chest pain.  Endocrine: Negative for polydipsia, polyphagia and polyuria.     Today's Vitals   09/19/18 1440  BP: 120/78    Observations/Objective: No acute respiratory distress.  No bilateral lower extremity edema.         Assessment and Plan: 1. Fibromyalgia  No recent exacerbations  She is living in [place] and has no plans to return but she need to continue to have her medical care here due to her insurance coverage  Will refer her to CCM to see if they can assist or direct her to the appropriate place for a change in her insurance.  - pregabalin (LYRICA) 75 MG capsule; Take 1 capsule (75 mg total) by mouth daily.  Dispense: 30 capsule; Refill: 2 - Referral to Chronic Care Management Services  2. Type 2 diabetes mellitus without complication, without long-term current use of insulin (HCC) Chronic, poorly controlled She will need her HgbA1c checked but she is not local.Will try to  Continue with current medications Encouraged to limit intake of sugary foods and drinks Encouraged to increase physical activity to 150 minutes per week - Referral to Chronic Care Management Services  3. Vitamin D deficiency  Will check vitamin D level and supplement as needed.     Also encouraged to spend 15 minutes in the sun daily.  - Referral to  Chronic Care Management Services  4. Essential hypertension . B/P is controlled.  . CMP ordered to check renal function.  . The importance of regular exercise and dietary modification was stressed to the patient.  . Stressed importance of losing ten percent of her body weight to help with B/P control.  . The weight loss would help with decreasing cardiac and cancer risk as well.  - Referral to Chronic Care Management Services   Follow Up Instructions:  She needs to have blood work done however she is living in [place] and currently under [employer].  I discussed the assessment and treatment plan with the patient. The patient was provided an opportunity to ask questions and all were answered. The patient agreed with the plan and demonstrated an understanding of the instructions.   The patient was advised to call back or  seek an in-person evaluation if the symptoms worsen or if the condition fails to improve as anticipated.  COVID-19 Education: The signs and symptoms of COVID-19 were discussed with the patient and how to seek care for testing (follow up with PCP or arrange E-visit).  The importance of social distancing was discussed today.   Patient Risk:   After full review of this patients clinical status, I feel that they are at least moderate risk at this time.   I provided 16 minutes of non-face-to-face time during this encounter.   [name], FNP

## 2018-09-26 ENCOUNTER — Ambulatory Visit: Payer: Self-pay

## 2018-09-26 DIAGNOSIS — E119 Type 2 diabetes mellitus without complications: Secondary | ICD-10-CM

## 2018-09-26 DIAGNOSIS — I10 Essential (primary) hypertension: Secondary | ICD-10-CM

## 2018-09-26 DIAGNOSIS — E559 Vitamin D deficiency, unspecified: Secondary | ICD-10-CM

## 2018-09-26 NOTE — Progress Notes (Signed)
This encounter was created in error - please disregard.

## 2018-09-26 NOTE — Chronic Care Management (AMB) (Signed)
  Chronic Care Management    Clinical Social Work General Note  09/26/2018 Name: Emily Cochran MRN: [id number] DOB: May 23, 1961  [name] is a 58 y.o. year old female who is a primary care patient of [name], [place]. The CCM was consulted to assist the patient with [employer].   Ms. Chatwin was given information about Chronic Care Management services today including:  1. CCM service includes personalized support from designated clinical staff supervised by her physician, including individualized plan of care and coordination with other care providers 2. 24/7 contact phone numbers for assistance for urgent and routine care needs. 3. Service will only be billed when office clinical staff spend 20 minutes or more in a month to coordinate care. 4. Only one practitioner may furnish and bill the service in a calendar month. 5. The patient may stop CCM services at any time (effective at the end of the month) by phone call to the office staff. 6. The patient will be responsible for cost sharing (co-pay) of up to 20% of the service fee (after annual deductible is met).  Patient did not agree to services and does not wish to consider at this time.  Review of patient status, including review of consultants reports, relevant laboratory and other test results, and collaboration with appropriate care team members and the patient's provider was performed as part of comprehensive patient evaluation and provision of chronic care management services.    I reached out to Mrs. Schifano today in response to a referral sent by the patients primary provider to assist with community resources. The patient is currently living in [place] with no plans of returning to [facility]. The patient requested her demographics continue to protray her previous address as that is "what my insurance has on file". The patient reports she is unable to be seen in [place] with [place] issued Medicare and Medicaid. SW discussed  options to have the insurance transferred to an alternate state. The patient declined stating "I know what I have to do, I just don't want to do that. I will keep it how it is since it does not expire until next year and will come to [place] to see my doctor why I need to".   Follow Up Plan: No follow up needed at this time.        Daneen Schick, BSW, CDP [facility] Care Management Social Worker [phone]  Total time spent performing care coordination and/or care management activities with the patient by phone or face to face = 8 minutes.

## 2018-10-02 ENCOUNTER — Other Ambulatory Visit: Payer: Self-pay

## 2018-10-02 ENCOUNTER — Other Ambulatory Visit: Payer: Self-pay | Admitting: Nurse Practitioner

## 2018-10-02 DIAGNOSIS — M797 Fibromyalgia: Secondary | ICD-10-CM

## 2018-10-02 MED ORDER — PREGABALIN 75 MG PO CAPS: 75.0000 mg | ORAL_CAPSULE | Freq: Every day | ORAL | 2 refills | Status: DC

## 2018-10-12 ENCOUNTER — Other Ambulatory Visit: Payer: Self-pay

## 2018-10-12 MED ORDER — ATORVASTATIN CALCIUM 10 MG PO TABS: 10.0000 mg | ORAL_TABLET | Freq: Every day | ORAL | 0 refills | Status: DC

## 2018-11-07 ENCOUNTER — Telehealth: Payer: Self-pay

## 2018-11-07 NOTE — Telephone Encounter (Signed)
I returned pt call to confirm her appointment on 06/18. Patient consented to virtual. Adolph Pollack

## 2018-11-14 ENCOUNTER — Ambulatory Visit: Payer: Medicare Other | Admitting: Internal Medicine

## 2018-11-22 ENCOUNTER — Ambulatory Visit (INDEPENDENT_AMBULATORY_CARE_PROVIDER_SITE_OTHER): Payer: Medicare Other | Admitting: Nurse Practitioner

## 2018-11-22 ENCOUNTER — Other Ambulatory Visit: Payer: Self-pay

## 2018-11-22 ENCOUNTER — Encounter: Payer: Self-pay | Admitting: Nurse Practitioner

## 2018-11-22 VITALS — BP 120/76 | Ht 62.0 in | Wt 192.0 lb

## 2018-11-22 DIAGNOSIS — G8929 Other chronic pain: Secondary | ICD-10-CM

## 2018-11-22 DIAGNOSIS — E559 Vitamin D deficiency, unspecified: Secondary | ICD-10-CM

## 2018-11-22 DIAGNOSIS — E119 Type 2 diabetes mellitus without complications: Secondary | ICD-10-CM | POA: Diagnosis not present

## 2018-11-22 DIAGNOSIS — M25561 Pain in right knee: Secondary | ICD-10-CM

## 2018-11-22 DIAGNOSIS — I10 Essential (primary) hypertension: Secondary | ICD-10-CM

## 2018-11-22 DIAGNOSIS — M25562 Pain in left knee: Secondary | ICD-10-CM

## 2018-11-22 MED ORDER — OZEMPIC (0.25 OR 0.5 MG/DOSE) 2 MG/1.5ML ~~LOC~~ SOPN: 0.5000 mg | PEN_INJECTOR | SUBCUTANEOUS | 5 refills | Status: DC

## 2018-11-22 NOTE — Progress Notes (Signed)
Virtual Visit via Video   This visit type was conducted due to national recommendations for restrictions regarding the COVID-19 Pandemic (e.g. social distancing) in an effort to limit this patient's exposure and mitigate transmission in our community.  Patients identity confirmed using two different identifiers.  This format is felt to be most appropriate for this patient at this time.  All issues noted in this document were discussed and addressed.  No physical exam was performed (except for noted visual exam findings with Video Visits).    Date:  12/03/2018   ID:  Emily Cochran, DOB 03-25-1961, MRN [id number]  Patient Location:  Home - spoke with Elaina Hoops  Provider location:   Office    Chief Complaint:  Diabetes follow up  History of Present Illness:    Emily Cochran is a 58 y.o. female who presents via video conferencing for a telehealth visit today.    The patient does not have symptoms concerning for COVID-19 infection (fever, chills, cough, or new shortness of breath).   Diabetes She presents for her follow-up diabetic visit. She has type 2 diabetes mellitus. Her disease course has been stable. Pertinent negatives for hypoglycemia include no dizziness. There are no diabetic associated symptoms. Pertinent negatives for diabetes include no blurred vision. There are no hypoglycemic complications. Symptoms are stable. There are no diabetic complications. Risk factors for coronary artery disease include diabetes mellitus, sedentary lifestyle and obesity. Current diabetic treatment includes oral agent (dual therapy). She is compliant with treatment most of the time. When asked about meal planning, she reported none. She has not had a previous visit with a dietitian. She participates in exercise intermittently. There is no change in her home blood glucose trend. (Blood sugar 80-120) An ACE inhibitor/angiotensin II receptor blocker is being taken. She does not see a  podiatrist.Eye exam is not current.  Knee Pain  The incident occurred more than 1 week ago. The pain is present in the right knee and left knee. The quality of the pain is described as aching. The pain has been intermittent since onset. Pertinent negatives include no tingling. Treatments tried: she is using a gel.     Past Medical History:  Diagnosis Date  . Asthma   . Bipolar 1 disorder ([place])   . Hypertension   . Kidney stones    Past Surgical History:  Procedure Laterality Date  . ABDOMINAL HYSTERECTOMY    . APPENDECTOMY    . CHOLECYSTECTOMY       Current Meds  Medication Sig  . ACCU-CHEK GUIDE test strip CHECK BLOOD SUGAR UP TO 4 TIMES DAILY  . ACCU-CHEK SOFTCLIX LANCETS lancets CHECK BLOOD SUGAR UP TO 4 TIMES DAILY  . albuterol (PROVENTIL HFA;VENTOLIN HFA) 108 (90 Base) MCG/ACT inhaler Inhale 1-2 puffs into the lungs every 6 (six) hours as needed for wheezing or shortness of breath.  Marland Kitchen amLODipine (NORVASC) 5 MG tablet Take 1 tablet (5 mg total) by mouth daily.  Marland Kitchen atorvastatin (LIPITOR) 10 MG tablet Take 1 tablet (10 mg total) by mouth at bedtime.  . blood glucose meter kit and supplies KIT Dispense based on patient and insurance preference. Use up to four times daily as directed. (FOR ICD-10 E11.65).  . Diphenhydramine-APAP, sleep, (GOODY PM PO) Take 1 Package by mouth daily as needed (pain).   . fluticasone (FLONASE) 50 MCG/ACT nasal spray Place 2 sprays into both nostrils daily.  . metoprolol tartrate (LOPRESSOR) 25 MG tablet Take 1 tablet (25 mg total) by mouth 2 (  two) times daily.  . pregabalin (LYRICA) 75 MG capsule Take 1 capsule (75 mg total) by mouth daily.  . QUEtiapine (SEROQUEL) 200 MG tablet Take 200 mg by mouth at bedtime.      Allergies:   Coconut flavor, Shellfish allergy, Ultram [tramadol], Aspirin, and Eggs or egg-derived products   Social History   Tobacco Use  . Smoking status: Never Smoker  . Smokeless tobacco: Never Used  Substance Use Topics  .  Alcohol use: No  . Drug use: No     Family Hx: The patient's family history includes Bipolar disorder in her mother; Cancer in her father and mother; Diabetes in her father and mother; Hypertension in her mother; Schizophrenia in her mother.  ROS:   Please see the history of present illness.    Review of Systems  Constitutional: Negative.   Eyes: Negative for blurred vision.  Respiratory: Negative.   Cardiovascular: Negative.   Neurological: Negative.  Negative for dizziness and tingling.  Psychiatric/Behavioral: Negative.  Negative for depression.    All other systems reviewed and are negative.   Labs/Other Tests and Data Reviewed:    Recent Labs: 04/19/2018: ALT 14; BUN 10; Creatinine, Ser 0.73; Hemoglobin 11.4; Platelets 366; Potassium 4.1; Sodium 140 05/09/2018: TSH 0.90   Recent Lipid Panel Lab Results  Component Value Date/Time   CHOL 176 04/19/2018 11:29 AM   TRIG 90 04/19/2018 11:29 AM   HDL 46 04/19/2018 11:29 AM   CHOLHDL 3.8 04/19/2018 11:29 AM   LDLCALC 112 (H) 04/19/2018 11:29 AM    Wt Readings from Last 3 Encounters:  11/22/18 192 lb (87.1 kg)  05/09/18 201 lb 9.6 oz (91.4 kg)  04/28/18 205 lb 6.4 oz (93.2 kg)     Exam:    Vital Signs:  BP 120/76 (BP Location: Right Arm, Patient Position: Sitting, Cuff Size: Large)   Ht _0  (1.575 m)   Wt 192 lb (87.1 kg)   BMI 35.12 kg/m     Physical Exam  Constitutional: She is oriented to person, place, and time and well-developed, well-nourished, and in no distress.  Neurological: She is alert and oriented to person, place, and time.  Psychiatric: Mood, memory, affect and judgment normal.    ASSESSMENT & PLAN:    1. Type 2 diabetes mellitus without complication, without long-term current use of insulin (HCC)  Chronic, controlled  Continue with current medications  Encouraged to limit intake of sugary foods and drinks  Encouraged to increase physical activity to 150 minutes per week as tolerated -  CMP14 + Anion Gap; Future - Lipid Profile; Future - Hemoglobin A1c; Future - Semaglutide,0.25 or 0.5MG/DOS, (OZEMPIC, 0.25 OR 0.5 MG/DOSE,) 2 MG/1.5ML SOPN; Inject 0.5 mg into the skin once a week.  Dispense: 1 pen; Refill: 5  2. Vitamin D deficiency  Will check vitamin D level and supplement as needed.     Also encouraged to spend 15 minutes in the sun daily.   3. Essential hypertension . B/P is controlled.  . CMP ordered to check renal function.  . The importance of regular exercise and dietary modification was stressed to the patient.  . Stressed importance of losing ten percent of her body weight to help with B/P control.  - CMP14 + Anion Gap; Future  4. Chronic pain of both knees  She is having bilateral knee pain and would like a referral to orthopedics  She is currently in [place] and is planning to come down at some time in the next couple  of months - Ambulatory referral to Orthopedic Surgery   COVID-19 Education: The signs and symptoms of COVID-19 were discussed with the patient and how to seek care for testing (follow up with PCP or arrange E-visit).  The importance of social distancing was discussed today.  Patient Risk:   After full review of this patients clinical status, I feel that they are at least moderate risk at this time.  Time:   Today, I have spent 14 minutes/ seconds with the patient with telehealth technology discussing above diagnoses.     Medication Adjustments/Labs and Tests Ordered: Current medicines are reviewed at length with the patient today.  Concerns regarding medicines are outlined above.   Tests Ordered: Orders Placed This Encounter  Procedures  . CMP14 + Anion Gap  . Lipid Profile  . Hemoglobin A1c  . Ambulatory referral to Orthopedic Surgery    Medication Changes: Meds ordered this encounter  Medications  . Semaglutide,0.25 or 0.5MG/DOS, (OZEMPIC, 0.25 OR 0.5 MG/DOSE,) 2 MG/1.5ML SOPN    Sig: Inject 0.5 mg into the skin once a  week.    Dispense:  1 pen    Refill:  5    Disposition:  Follow up in 3 month(s)  Signed, [name], FNP

## 2018-11-23 ENCOUNTER — Ambulatory Visit: Payer: Medicare Other | Admitting: Nurse Practitioner

## 2018-11-29 ENCOUNTER — Telehealth: Payer: Self-pay

## 2018-11-29 NOTE — Telephone Encounter (Signed)
I called pt to see if she is taking her ozempic as directed because her ins sent over a letter stating that she is not filling it as directed. Pt stated she is taking her refill YRL,RMA

## 2018-12-27 ENCOUNTER — Other Ambulatory Visit: Payer: Self-pay | Admitting: Nurse Practitioner

## 2018-12-27 DIAGNOSIS — E119 Type 2 diabetes mellitus without complications: Secondary | ICD-10-CM

## 2019-01-29 ENCOUNTER — Other Ambulatory Visit: Payer: Self-pay

## 2019-01-31 ENCOUNTER — Ambulatory Visit (INDEPENDENT_AMBULATORY_CARE_PROVIDER_SITE_OTHER): Payer: Medicare Other | Admitting: Internal Medicine

## 2019-01-31 ENCOUNTER — Other Ambulatory Visit: Payer: Self-pay

## 2019-01-31 ENCOUNTER — Encounter: Payer: Self-pay | Admitting: Internal Medicine

## 2019-01-31 VITALS — BP 118/72 | HR 68 | Temp 98.1°F | Ht 62.0 in | Wt 203.2 lb

## 2019-01-31 DIAGNOSIS — E061 Subacute thyroiditis: Secondary | ICD-10-CM

## 2019-01-31 LAB — TSH: TSH: 0.44 u[IU]/mL (ref 0.35–4.50)

## 2019-01-31 LAB — T4, FREE: Free T4: 0.91 ng/dL (ref 0.60–1.60)

## 2019-01-31 NOTE — Patient Instructions (Signed)
-   Please stop by the lab today, we will contact you with the results.    

## 2019-01-31 NOTE — Progress Notes (Signed)
Name: Emily Cochran  MRN/ DOB: [id number], 26-May-1961    Age/ Sex: 58 y.o., female     PCP: [name], FNP   Reason for Endocrinology Evaluation: Low TSH      Initial Endocrinology Clinic Visit: 05/09/2018    PATIENT IDENTIFIER: [name] is a 58 y.o., female with a past medical history of HTN, Bipolar d/o, T2DM and Asthma . She has followed with [facility] since 05/09/2018 for consultative assistance with management of her low TSH .   HISTORICAL SUMMARY: The patient was first noted to have a low TSH on labs in November, 2019. She did note neck enlargement in the time.  She did have tenderness on exam which was consistent with subacute thyroiditis. Repeat labs a month later were normal.   Mother with thyroid and disease SUBJECTIVE:   During last visit (05/09/2018): Repeat TFT's are normal.   Today (01/31/2019):  Emily Cochran is here for a follow up on subacute thyroiditis. Today she is c/o neck stiffness and pain at the back of her neck that radiates down her spine. Denies anterior neck pain.   No dysphagia of odynophagia   Weight has been steady with no constipation Has depression and takes Seroquel   No heat/cold intolerance   ROS:  As per HPI.   HISTORY:  Past Medical History:  Past Medical History:  Diagnosis Date  . Asthma   . Bipolar 1 disorder ([place])   . Hypertension   . Kidney stones    Past Surgical History:  Past Surgical History:  Procedure Laterality Date  . ABDOMINAL HYSTERECTOMY    . APPENDECTOMY    . CHOLECYSTECTOMY      Social History:  reports that she has never smoked. She has never used smokeless tobacco. She reports that she does not drink alcohol or use drugs. Family History:  Family History  Problem Relation Age of Onset  . Bipolar disorder Mother   . Schizophrenia Mother   . Diabetes Mother   . Cancer Mother   . Hypertension Mother   . Cancer Father   . Diabetes Father      HOME MEDICATIONS:  Allergies as of 01/31/2019      Reactions   Coconut Flavor Shortness Of Breath, Swelling   Shellfish Allergy Shortness Of Breath, Swelling   She can eat shrimp   Ultram [tramadol] Anaphylaxis   Pt REPORTS THROAT SWELLING   Aspirin Swelling, Other (See Comments)   Angioedema, cuts on lips   Eggs Or Egg-derived Products Swelling      Medication List       Accurate as of January 31, 2019  4:27 PM. If you have any questions, ask your nurse or doctor.        Accu-Chek Guide test strip Generic drug: glucose blood CHECK BLOOD SUGAR UP TO 4 TIMES DAILY   Accu-Chek Softclix Lancets lancets CHECK BLOOD SUGAR UP TO 4 TIMES DAILY   albuterol 108 (90 Base) MCG/ACT inhaler Commonly known as: VENTOLIN HFA Inhale 1-2 puffs into the lungs every 6 (six) hours as needed for wheezing or shortness of breath.   amLODipine 5 MG tablet Commonly known as: NORVASC Take 1 tablet (5 mg total) by mouth daily.   atorvastatin 10 MG tablet Commonly known as: Lipitor Take 1 tablet (10 mg total) by mouth at bedtime.   blood glucose meter kit and supplies Kit Dispense based on patient and insurance preference. Use up to four times daily as directed. (FOR ICD-10  E11.65).   fluticasone 50 MCG/ACT nasal spray Commonly known as: FLONASE Place 2 sprays into both nostrils daily.   GOODY PM PO Take 1 Package by mouth daily as needed (pain).   metoprolol tartrate 25 MG tablet Commonly known as: LOPRESSOR Take 1 tablet (25 mg total) by mouth 2 (two) times daily.   Ozempic (0.25 or 0.5 MG/DOSE) 2 MG/1.5ML Sopn Generic drug: Semaglutide(0.25 or 0.5MG/DOS) INJECT 0.5MG INTO THE SKIN ONCE A WEEK   pregabalin 75 MG capsule Commonly known as: LYRICA Take 1 capsule (75 mg total) by mouth daily.   QUEtiapine 200 MG tablet Commonly known as: SEROQUEL Take 200 mg by mouth at bedtime.         OBJECTIVE:   PHYSICAL EXAM: VS: BP 118/72 (BP Location: Left Arm, Patient Position: Sitting, Cuff Size:  Large)   Pulse 68   Temp 98.1 F (36.7 C)   Ht 5' 2" (1.575 m)   Wt 203 lb 3.2 oz (92.2 kg)   SpO2 99%   BMI 37.17 kg/m    EXAM: General: Pt appears well and is in NAD  Neck: General: Supple without adenopathy. Thyroid: Thyroid size normal.  No goiter or nodules appreciated. No tenderness  Lungs: Clear with good BS bilat with no rales, rhonchi, or wheezes  Heart: Auscultation: RRR.  Abdomen: Normoactive bowel sounds, soft, nontender, without masses or organomegaly palpable  Extremities:  BL LE: No pretibial edema normal ROM and strength.  Skin: Hair: Texture and amount normal with gender appropriate distribution Skin Inspection: No rashes Skin Palpation: Skin temperature, texture, and thickness normal to palpation  Neuro: Cranial nerves: II - XII grossly intact  Motor: Normal strength throughout DTRs: 2+ and symmetric in UE without delay in relaxation phase  Mental Status: Judgment, insight: Intact Orientation: Oriented to time, place, and person Mood and affect: No depression, anxiety, or agitation     DATA REVIEWED:   Results for Emily Cochran, Emily Cochran (MRN [id number]) as of 01/31/2019 16:33  Ref. Range 04/19/2018 11:29 05/09/2018 09:35 01/31/2019 10:59  TSH Latest Ref Range: 0.35 - 4.50 uIU/mL 0.425 (L) 0.90 0.44  Triiodothyronine,Free,Serum Latest Ref Range: 2.0 - 4.4 pg/mL 3.1    Triiodothyronine (T3) Latest Ref Range: 76 - 181 ng/dL  143   T4,Free(Direct) Latest Ref Range: 0.60 - 1.60 ng/dL  0.85 0.91    ASSESSMENT / PLAN / RECOMMENDATIONS:    1. Subacute Thyroiditis :   - Resolved  - Pt is clinically euthyroid  - Repeat TFT's are normal - Reassurance provided   F/U PRN    Signed electronically by: Mack Guise, MD  [facility]  [address] [facility] [address] [place], [place] [address] Phone: [phone] FAX: [phone]      CC: [name], [address] [place] [address] Phone:  [phone]  Fax: [phone]   Return to Endocrinology clinic as below: Future Appointments  Date Time Provider [place]  02/22/2019 10:30 AM [name], FNP [facility] None

## 2019-02-22 ENCOUNTER — Ambulatory Visit: Payer: Medicare Other | Admitting: Nurse Practitioner

## 2019-03-26 ENCOUNTER — Telehealth: Payer: Self-pay

## 2019-03-26 NOTE — Telephone Encounter (Signed)
Pt LVM stating she was having chest pains, and pain on the side of her throat I spoke with pt  per [name], pt needs to go to the emergency room to be checked out. Pt agreed

## 2019-04-24 IMAGING — CR DG KNEE COMPLETE 4+V*R*
4 series · 4 of 4 positions shown · non-contrast
Comparison: 08/25/2015

CLINICAL DATA: Pt here for bilateral leg pain. Pt said her right
leg began to hurt 6 months ago, has been limping since and now her
left leg is sore. No injury or trauma to the legs in 6 months

EXAM:
RIGHT KNEE - COMPLETE 4+ VIEW

[knee ap]
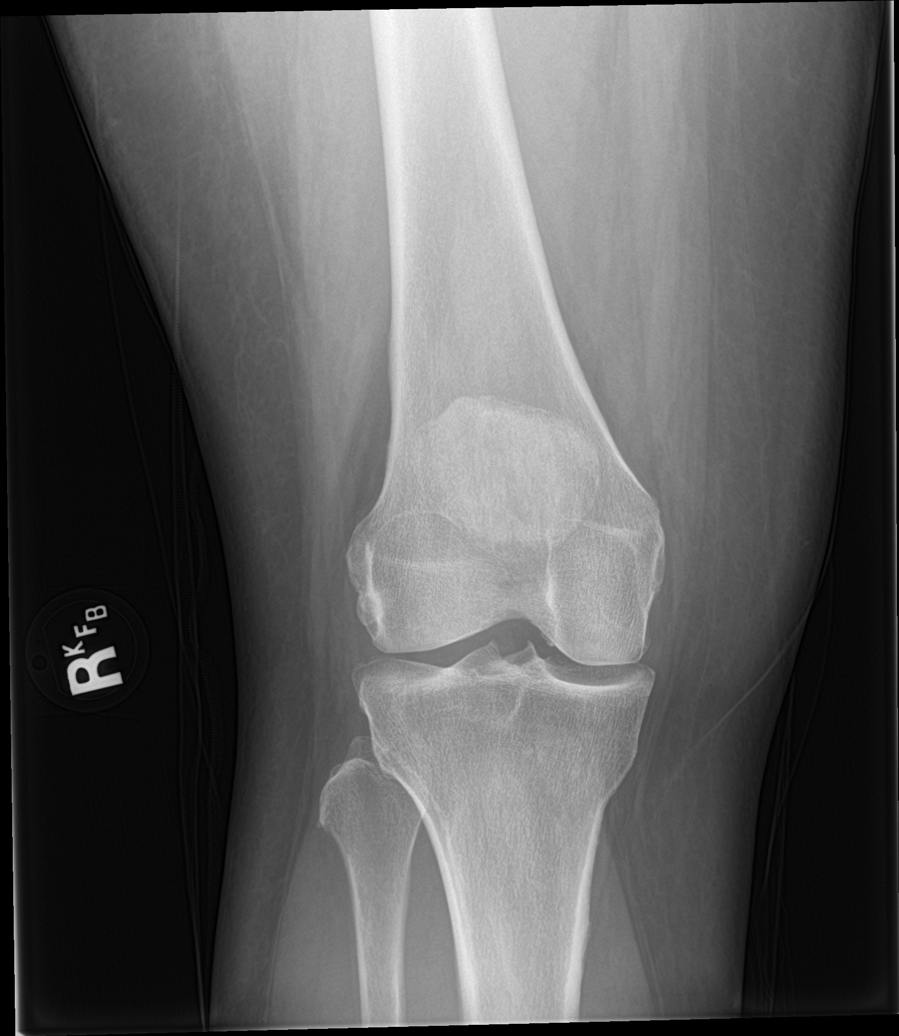

[knee lat]
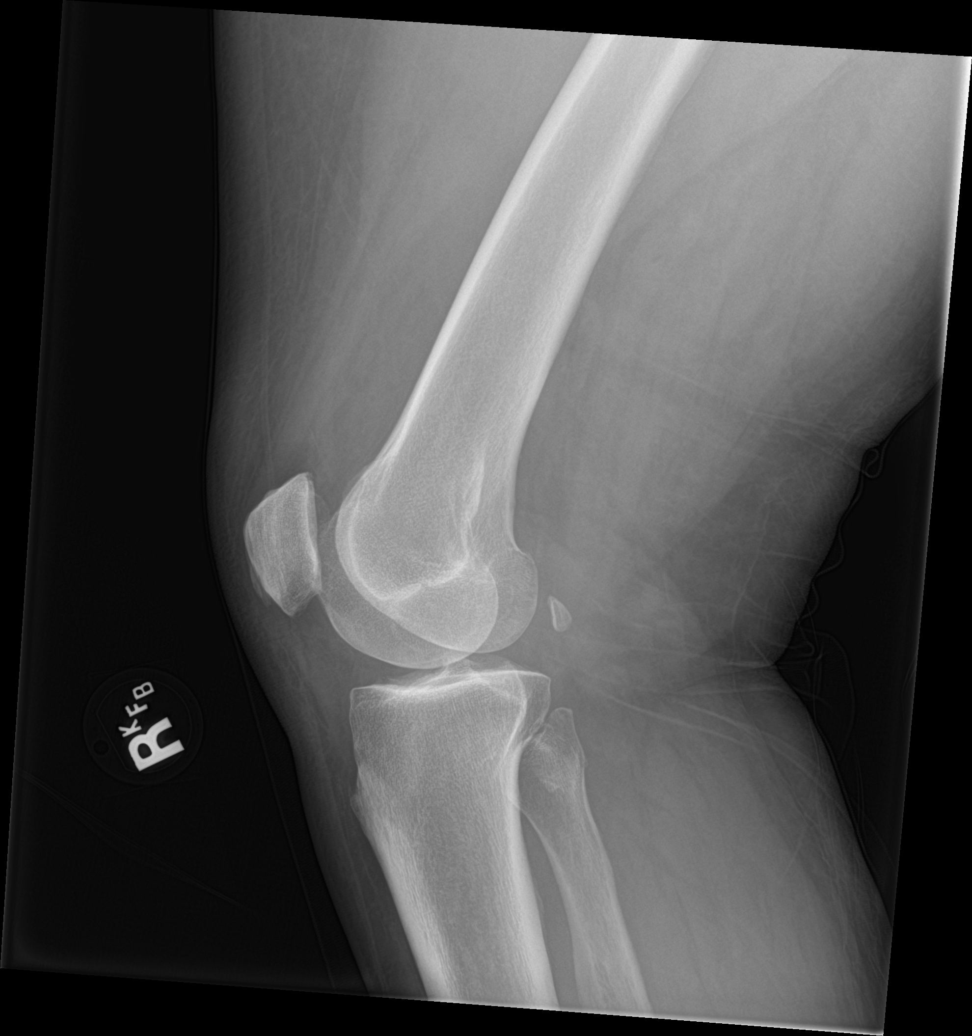

[knee obl (1 of 2)]
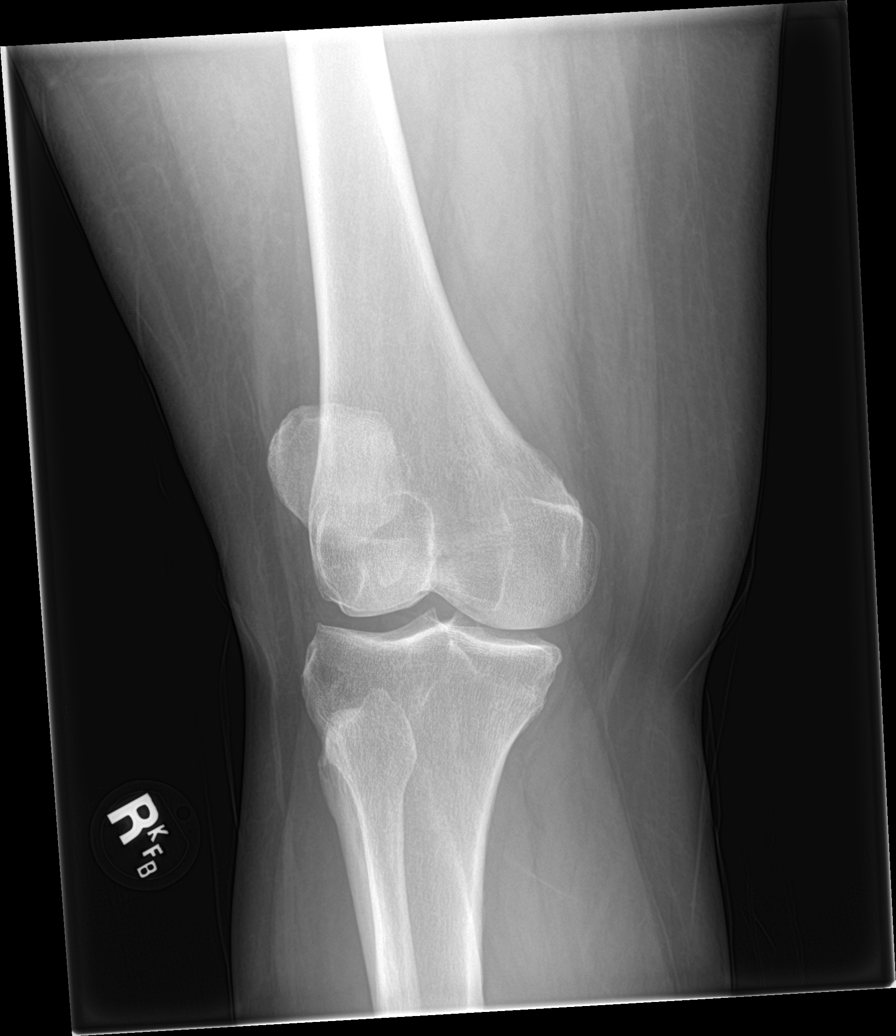

[knee obl (2 of 2)]
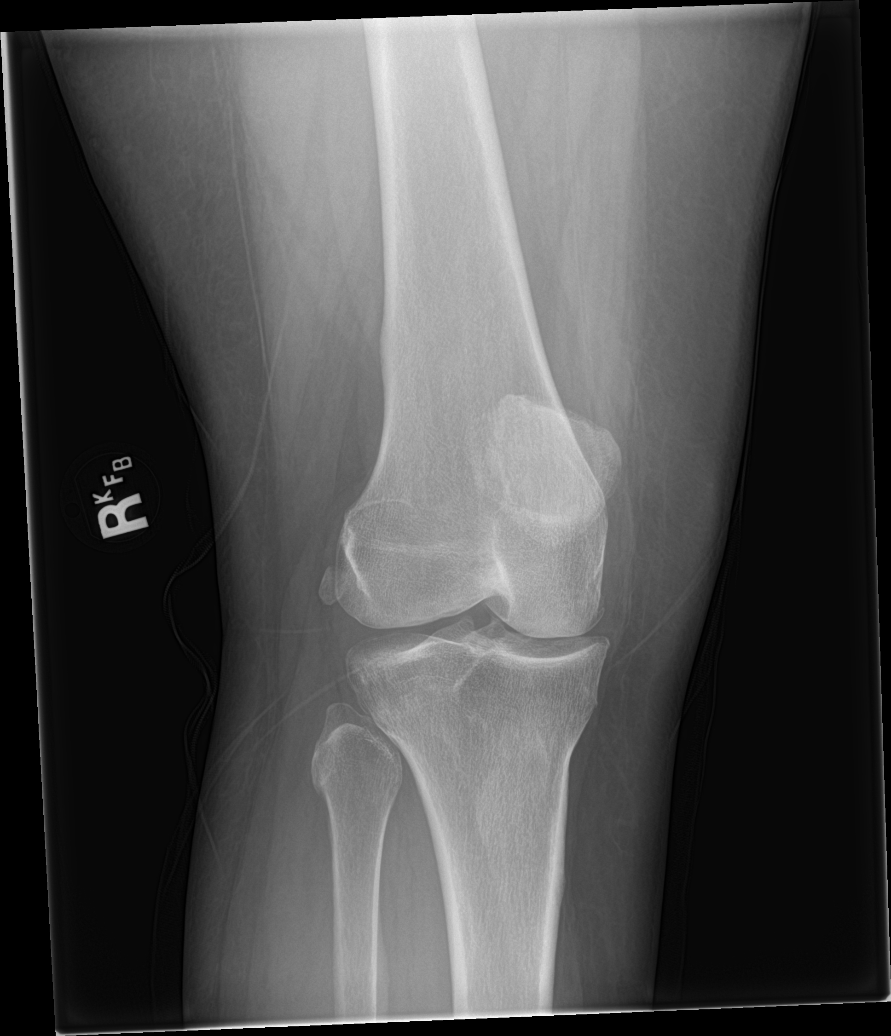

[4 of 4 positions shown; findings below may reference images not displayed]

FINDINGS: No fracture or bone lesion.

Mild medial joint space compartment narrowing with small marginal
osteophytes. No other arthropathic change.

No convincing joint effusion.

Soft tissues are unremarkable.
IMPRESSION: 1. No fracture or acute finding.
2. Mild osteoarthritis of the medial compartment.

## 2019-05-24 ENCOUNTER — Ambulatory Visit (INDEPENDENT_AMBULATORY_CARE_PROVIDER_SITE_OTHER): Payer: Medicare Other | Admitting: Nurse Practitioner

## 2019-05-24 ENCOUNTER — Encounter: Payer: Self-pay | Admitting: Nurse Practitioner

## 2019-05-24 ENCOUNTER — Other Ambulatory Visit: Payer: Self-pay

## 2019-05-24 VITALS — BP 122/80 | HR 69 | Temp 98.2°F | Ht 61.6 in | Wt 212.8 lb

## 2019-05-24 DIAGNOSIS — R946 Abnormal results of thyroid function studies: Secondary | ICD-10-CM | POA: Diagnosis not present

## 2019-05-24 DIAGNOSIS — Z1231 Encounter for screening mammogram for malignant neoplasm of breast: Secondary | ICD-10-CM

## 2019-05-24 DIAGNOSIS — F209 Schizophrenia, unspecified: Secondary | ICD-10-CM

## 2019-05-24 DIAGNOSIS — M797 Fibromyalgia: Secondary | ICD-10-CM

## 2019-05-24 DIAGNOSIS — I10 Essential (primary) hypertension: Secondary | ICD-10-CM | POA: Diagnosis not present

## 2019-05-24 DIAGNOSIS — E119 Type 2 diabetes mellitus without complications: Secondary | ICD-10-CM

## 2019-05-24 DIAGNOSIS — Z139 Encounter for screening, unspecified: Secondary | ICD-10-CM

## 2019-05-24 DIAGNOSIS — Z23 Encounter for immunization: Secondary | ICD-10-CM

## 2019-05-24 DIAGNOSIS — Z1211 Encounter for screening for malignant neoplasm of colon: Secondary | ICD-10-CM

## 2019-05-24 DIAGNOSIS — Z20828 Contact with and (suspected) exposure to other viral communicable diseases: Secondary | ICD-10-CM

## 2019-05-24 LAB — POCT URINALYSIS DIPSTICK
Bilirubin, UA: NEGATIVE
Blood, UA: NEGATIVE
Glucose, UA: POSITIVE — AB
Ketones, UA: NEGATIVE
Leukocytes, UA: NEGATIVE
Nitrite, UA: NEGATIVE
Protein, UA: NEGATIVE
Spec Grav, UA: 1.02 (ref 1.010–1.025)
Urobilinogen, UA: 0.2 E.U./dL
pH, UA: 6.5 (ref 5.0–8.0)

## 2019-05-24 LAB — POCT UA - MICROALBUMIN
Albumin/Creatinine Ratio, Urine, POC: <30
Creatinine, POC: 100 mg/dL
Microalbumin Ur, POC: 30 mg/L

## 2019-05-24 MED ORDER — TETANUS-DIPHTH-ACELL PERTUSSIS 5-2.5-18.5 LF-MCG/0.5 IM SUSP: 0.5000 mL | Freq: Once | INTRAMUSCULAR | 0 refills | Status: AC

## 2019-05-24 MED ORDER — PREGABALIN 75 MG PO CAPS: 75.0000 mg | ORAL_CAPSULE | Freq: Every day | ORAL | 6 refills | Status: DC

## 2019-05-24 MED ORDER — METOPROLOL TARTRATE 25 MG PO TABS: 25.0000 mg | ORAL_TABLET | Freq: Two times a day (BID) | ORAL | 1 refills | Status: DC

## 2019-05-24 MED ORDER — OZEMPIC (0.25 OR 0.5 MG/DOSE) 2 MG/1.5ML ~~LOC~~ SOPN: 0.5000 mL | PEN_INJECTOR | SUBCUTANEOUS | 1 refills | Status: DC

## 2019-05-24 MED ORDER — PRAVASTATIN SODIUM 20 MG PO TABS: ORAL_TABLET | ORAL | 1 refills | Status: DC

## 2019-05-24 NOTE — Progress Notes (Signed)
This visit occurred during the SARS-CoV-2 public health emergency.  Safety protocols were in place, including screening questions prior to the visit, additional usage of staff PPE, and extensive cleaning of exam room while observing appropriate contact time as indicated for disinfecting solutions.  Subjective:     Patient ID: Emily Cochran , female    DOB: Feb 22, 1961 , 58 y.o.   MRN: [id number]   Chief Complaint  Patient presents with  . Diabetes    HPI  She moved back from [place] 2 months ago.    Diabetes She presents for her follow-up diabetic visit. She has type 2 diabetes mellitus. There are no hypoglycemic associated symptoms. Pertinent negatives for hypoglycemia include no dizziness or headaches. There are no diabetic associated symptoms. Pertinent negatives for diabetes include no chest pain and no fatigue. There are no hypoglycemic complications. There are no diabetic complications. Risk factors for coronary artery disease include obesity, sedentary lifestyle, hypertension and diabetes mellitus. Current diabetic treatment includes oral agent (dual therapy). (Averaging blood sugar of 98)     Past Medical History:  Diagnosis Date  . Asthma   . Bipolar 1 disorder ([place])   . Hypertension   . Kidney stones      Family History  Problem Relation Age of Onset  . Bipolar disorder Mother   . Schizophrenia Mother   . Diabetes Mother   . Cancer Mother   . Hypertension Mother   . Cancer Father   . Diabetes Father      Current Outpatient Medications:  .  ACCU-CHEK GUIDE test strip, CHECK BLOOD SUGAR UP TO 4 TIMES DAILY, Disp: , Rfl: 0 .  ACCU-CHEK SOFTCLIX LANCETS lancets, CHECK BLOOD SUGAR UP TO 4 TIMES DAILY, Disp: 300 each, Rfl: 1 .  albuterol (PROVENTIL HFA;VENTOLIN HFA) 108 (90 Base) MCG/ACT inhaler, Inhale 1-2 puffs into the lungs every 6 (six) hours as needed for wheezing or shortness of breath., Disp: 1 Inhaler, Rfl: 3 .  amLODipine (NORVASC) 5 MG tablet, Take 1  tablet (5 mg total) by mouth daily., Disp: 30 tablet, Rfl: 11 .  blood glucose meter kit and supplies KIT, Dispense based on patient and insurance preference. Use up to four times daily as directed. (FOR ICD-10 E11.65)., Disp: 1 each, Rfl: 0 .  Diphenhydramine-APAP, sleep, (GOODY PM PO), Take 1 Package by mouth daily as needed (pain). , Disp: , Rfl:  .  fluticasone (FLONASE) 50 MCG/ACT nasal spray, Place 2 sprays into both nostrils daily., Disp: 16 g, Rfl: 12 .  metoprolol tartrate (LOPRESSOR) 25 MG tablet, Take 1 tablet (25 mg total) by mouth 2 (two) times daily., Disp: 60 tablet, Rfl: 11 .  OZEMPIC, 0.25 OR 0.5 MG/DOSE, 2 MG/1.5ML SOPN, INJECT 0.5MG INTO THE SKIN ONCE A WEEK, Disp: 1.5 mL, Rfl: 4 .  pregabalin (LYRICA) 75 MG capsule, Take 1 capsule (75 mg total) by mouth daily., Disp: 30 capsule, Rfl: 2 .  QUEtiapine (SEROQUEL) 200 MG tablet, Take 200 mg by mouth at bedtime. , Disp: , Rfl: 0 .  atorvastatin (LIPITOR) 10 MG tablet, Take 1 tablet (10 mg total) by mouth at bedtime. (Patient not taking: Reported on 05/24/2019), Disp: 90 tablet, Rfl: 0   Allergies  Allergen Reactions  . Coconut Flavor Shortness Of Breath and Swelling  . Shellfish Allergy Shortness Of Breath and Swelling    She can eat shrimp  . Ultram [Tramadol] Anaphylaxis    Pt REPORTS THROAT SWELLING  . Aspirin Swelling and Other (See Comments)  Angioedema, cuts on lips  . Eggs Or Egg-Derived Products Swelling     Review of Systems  Constitutional: Negative.  Negative for fatigue.  Respiratory: Negative.  Negative for cough.   Cardiovascular: Negative.  Negative for chest pain, palpitations and leg swelling.  Gastrointestinal: Negative.   Neurological: Negative for dizziness and headaches.  Psychiatric/Behavioral: Negative.      Today's Vitals   05/24/19 1135  BP: 122/80  Pulse: 69  Temp: 98.2 F (36.8 C)  TempSrc: Oral  Weight: 212 lb 12.8 oz (96.5 kg)  Height: 5' 1.6" (1.565 m)  PainSc: 6    Body mass  index is 39.43 kg/m.   Objective:  Physical Exam Constitutional:      Appearance: Normal appearance.  Cardiovascular:     Rate and Rhythm: Normal rate and regular rhythm.     Pulses: Normal pulses.     Heart sounds: Normal heart sounds. No murmur.  Pulmonary:     Effort: Pulmonary effort is normal. No respiratory distress.     Breath sounds: Normal breath sounds.  Abdominal:     General: There is no distension.  Neurological:     General: No focal deficit present.     Mental Status: She is alert and oriented to person, place, and time.  Psychiatric:        Mood and Affect: Mood normal.        Behavior: Behavior normal.        Thought Content: Thought content normal.        Judgment: Judgment normal.         Assessment And Plan:     1. Type 2 diabetes mellitus without complication, without long-term current use of insulin (HCC)  Chronic, fair control, has not had a HgbA1c or kidney functions done in quite some time she has been out of the [place]  Continue with current medications, pending lab results  Encouraged to limit intake of sugary foods and drinks  Encouraged to increase physical activity to 150 minutes per week as tolerated - Semaglutide,0.25 or 0.'5MG'$ /DOS, (OZEMPIC, 0.25 OR 0.5 MG/DOSE,) 2 MG/1.5ML SOPN; Inject 0.5 mLs into the skin once a week.  Dispense: 4.5 mL; Refill: 1 - pravastatin (PRAVACHOL) 20 MG tablet; Take 1 tablet by mouth MWF  Dispense: 45 tablet; Refill: 1 - CMP14+EGFR - Hemoglobin A1c - Lipid Profile  2. Fibromyalgia  Chronic, stable  Continue with current medications - pregabalin (LYRICA) 75 MG capsule; Take 1 capsule (75 mg total) by mouth daily.  Dispense: 30 capsule; Refill: 6  3. Abnormal thyroid function test  Chronic, she has been followed by Dr. Chalmers Cater but has not seen her this year due to being out of town and the restrictions related to covid  Done in August were normal  - metoprolol tartrate (LOPRESSOR) 25 MG tablet; Take 1  tablet (25 mg total) by mouth 2 (two) times daily.  Dispense: 180 tablet; Refill: 1  4. Schizophrenia, unspecified type (HCC)  Chronic,   Controlled and no episodes  5. Essential hypertension . B/P is controlled.  . CMP ordered to check renal function.  . The importance of regular exercise and dietary modification was stressed to the patient.   - CMP14+EGFR  6. Screening mammogram, encounter for  Pt instructed on Self Breast Exam.According to ACOG guidelines Women aged 61 and older are recommended to get an annual mammogram. Form completed and given to patient contact the [facility] for appointment scheduing.   Pt encouraged to get annual  mammogram - MM Digital Diagnostic Bilat; Future  7. Encounter for screening colonoscopy  According to USPTF Colorectal cancer Screening guidelines. Colonoscopy is recommended every 10 years, starting at age 48years.  Will refer to GI for colon cancer screening. - Ambulatory referral to Gastroenterology  8. Encounter for immunization Will give tetanus vaccine today while in office. Refer to order management. TDAP will be administered to adults 13-44 years old every 10 years. - Tdap (BOOSTRIX) 5-2.5-18.5 LF-MCG/0.5 injection; Inject 0.5 mLs into the muscle once for 1 dose.  Dispense: 0.5 mL; Refill: 0  9. Encounter for screening  At patient request would like this done due to being in different environments while looking for housing.  - Novel Coronavirus, NAA ([facility])    [name], FNP    THE PATIENT IS ENCOURAGED TO PRACTICE SOCIAL DISTANCING DUE TO THE COVID-19 PANDEMIC.

## 2019-05-25 LAB — CMP14+EGFR
ALT: 13 IU/L (ref 0–32)
AST: 15 IU/L (ref 0–40)
Albumin/Globulin Ratio: 1.6 (ref 1.2–2.2)
Albumin: 4.1 g/dL (ref 3.8–4.9)
Alkaline Phosphatase: 111 IU/L (ref 39–117)
BUN/Creatinine Ratio: 16 (ref 9–23)
BUN: 11 mg/dL (ref 6–24)
Bilirubin Total: <0.2 mg/dL (ref 0.0–1.2)
CO2: 25 mmol/L (ref 20–29)
Calcium: 9.2 mg/dL (ref 8.7–10.2)
Chloride: 102 mmol/L (ref 96–106)
Creatinine, Ser: 0.68 mg/dL (ref 0.57–1.00)
GFR calc Af Amer: 112 mL/min/{1.73_m2} (ref >59)
GFR calc non Af Amer: 97 mL/min/{1.73_m2} (ref >59)
Globulin, Total: 2.6 g/dL (ref 1.5–4.5)
Glucose: 192 mg/dL — ABNORMAL HIGH (ref 65–99)
Potassium: 4.2 mmol/L (ref 3.5–5.2)
Sodium: 140 mmol/L (ref 134–144)
Total Protein: 6.7 g/dL (ref 6.0–8.5)

## 2019-05-25 LAB — LIPID PANEL
Chol/HDL Ratio: 3.8 ratio (ref 0.0–4.4)
Cholesterol, Total: 200 mg/dL — ABNORMAL HIGH (ref 100–199)
HDL: 52 mg/dL (ref >39)
LDL Chol Calc (NIH): 119 mg/dL — ABNORMAL HIGH (ref 0–99)
Triglycerides: 167 mg/dL — ABNORMAL HIGH (ref 0–149)
VLDL Cholesterol Cal: 29 mg/dL (ref 5–40)

## 2019-05-25 LAB — NOVEL CORONAVIRUS, NAA: SARS-CoV-2, NAA: NOT DETECTED

## 2019-05-25 LAB — HEMOGLOBIN A1C
Est. average glucose Bld gHb Est-mCnc: 166 mg/dL
Hgb A1c MFr Bld: 7.4 % — ABNORMAL HIGH (ref 4.8–5.6)

## 2019-05-28 ENCOUNTER — Other Ambulatory Visit: Payer: Self-pay

## 2019-05-28 DIAGNOSIS — E119 Type 2 diabetes mellitus without complications: Secondary | ICD-10-CM

## 2019-05-28 MED ORDER — OZEMPIC (0.25 OR 0.5 MG/DOSE) 2 MG/1.5ML ~~LOC~~ SOPN: 0.5000 mg | PEN_INJECTOR | SUBCUTANEOUS | 1 refills | Status: DC

## 2019-05-29 ENCOUNTER — Other Ambulatory Visit: Payer: Self-pay

## 2019-05-29 ENCOUNTER — Ambulatory Visit (INDEPENDENT_AMBULATORY_CARE_PROVIDER_SITE_OTHER): Payer: Medicare Other

## 2019-05-29 ENCOUNTER — Other Ambulatory Visit: Payer: Self-pay | Admitting: Nurse Practitioner

## 2019-05-29 VITALS — Ht 62.0 in | Wt 212.0 lb

## 2019-05-29 DIAGNOSIS — Z0000 Encounter for general adult medical examination without abnormal findings: Secondary | ICD-10-CM

## 2019-05-29 DIAGNOSIS — I10 Essential (primary) hypertension: Secondary | ICD-10-CM

## 2019-05-29 MED ORDER — ALBUTEROL SULFATE HFA 108 (90 BASE) MCG/ACT IN AERS: 1.0000 | INHALATION_SPRAY | Freq: Four times a day (QID) | RESPIRATORY_TRACT | 1 refills | Status: DC | PRN

## 2019-05-29 MED ORDER — AMLODIPINE BESYLATE 5 MG PO TABS: 5.0000 mg | ORAL_TABLET | Freq: Every day | ORAL | 1 refills | Status: DC

## 2019-05-29 NOTE — Patient Instructions (Signed)
Emily Cochran , Thank you for taking time to come for your Medicare Wellness Visit. I appreciate your ongoing commitment to your health goals. Please review the following plan we discussed and let me know if I can assist you in the future.   Screening recommendations/referrals: Colonoscopy: working on getting scheduled Mammogram: scheduling Bone Density: n/a Recommended yearly ophthalmology/optometry visit for glaucoma screening and checkup Recommended yearly dental visit for hygiene and checkup  Vaccinations: Influenza vaccine: declines Pneumococcal vaccine: declines Tdap vaccine: declines Shingles vaccine: declines    Advanced directives: Advance directive discussed with you today.   Conditions/risks identified: obesity  Next appointment: 08/22/2019 at 10:45  Preventive Care 40-64 Years, Female Preventive care refers to lifestyle choices and visits with your health care provider that can promote health and wellness. What does preventive care include?  A yearly physical exam. This is also called an annual well check.  Dental exams once or twice a year.  Routine eye exams. Ask your health care provider how often you should have your eyes checked.  Personal lifestyle choices, including:  Daily care of your teeth and gums.  Regular physical activity.  Eating a healthy diet.  Avoiding tobacco and drug use.  Limiting alcohol use.  Practicing safe sex.  Taking low-dose aspirin daily starting at age 24.  Taking vitamin and mineral supplements as recommended by your health care provider. What happens during an annual well check? The services and screenings done by your health care provider during your annual well check will depend on your age, overall health, lifestyle risk factors, and family history of disease. Counseling  Your health care provider may ask you questions about your:  Alcohol use.  Tobacco use.  Drug use.  Emotional well-being.  Home and  relationship well-being.  Sexual activity.  Eating habits.  Work and work Statistician.  Method of birth control.  Menstrual cycle.  Pregnancy history. Screening  You may have the following tests or measurements:  Height, weight, and BMI.  Blood pressure.  Lipid and cholesterol levels. These may be checked every 5 years, or more frequently if you are over 38 years old.  Skin check.  Lung cancer screening. You may have this screening every year starting at age 29 if you have a 30-pack-year history of smoking and currently smoke or have quit within the past 15 years.  Fecal occult blood test (FOBT) of the stool. You may have this test every year starting at age 61.  Flexible sigmoidoscopy or colonoscopy. You may have a sigmoidoscopy every 5 years or a colonoscopy every 10 years starting at age 50.  Hepatitis C blood test.  Hepatitis B blood test.  Sexually transmitted disease (STD) testing.  Diabetes screening. This is done by checking your blood sugar (glucose) after you have not eaten for a while (fasting). You may have this done every 1-3 years.  Mammogram. This may be done every 1-2 years. Talk to your health care provider about when you should start having regular mammograms. This may depend on whether you have a family history of breast cancer.  BRCA-related cancer screening. This may be done if you have a family history of breast, ovarian, tubal, or peritoneal cancers.  Pelvic exam and Pap test. This may be done every 3 years starting at age 62. Starting at age 38, this may be done every 5 years if you have a Pap test in combination with an HPV test.  Bone density scan. This is done to screen for osteoporosis. You may  have this scan if you are at high risk for osteoporosis. Discuss your test results, treatment options, and if necessary, the need for more tests with your health care provider. Vaccines  Your health care provider may recommend certain vaccines, such  as:  Influenza vaccine. This is recommended every year.  Tetanus, diphtheria, and acellular pertussis (Tdap, Td) vaccine. You may need a Td booster every 10 years.  Zoster vaccine. You may need this after age 24.  Pneumococcal 13-valent conjugate (PCV13) vaccine. You may need this if you have certain conditions and were not previously vaccinated.  Pneumococcal polysaccharide (PPSV23) vaccine. You may need one or two doses if you smoke cigarettes or if you have certain conditions. Talk to your health care provider about which screenings and vaccines you need and how often you need them. This information is not intended to replace advice given to you by your health care provider. Make sure you discuss any questions you have with your health care provider. Document Released: 06/20/2015 Document Revised: 02/11/2016 Document Reviewed: 03/25/2015 Elsevier Interactive Patient Education  2017 [place] Prevention in the Home Falls can cause injuries. They can happen to people of all ages. There are many things you can do to make your home safe and to help prevent falls. What can I do on the outside of my home?  Regularly fix the edges of walkways and driveways and fix any cracks.  Remove anything that might make you trip as you walk through a door, such as a raised step or threshold.  Trim any bushes or trees on the path to your home.  Use bright outdoor lighting.  Clear any walking paths of anything that might make someone trip, such as rocks or tools.  Regularly check to see if handrails are loose or broken. Make sure that both sides of any steps have handrails.  Any raised decks and porches should have guardrails on the edges.  Have any leaves, snow, or ice cleared regularly.  Use sand or salt on walking paths during winter.  Clean up any spills in your garage right away. This includes oil or grease spills. What can I do in the bathroom?  Use night  lights.  Install grab bars by the toilet and in the tub and shower. Do not use towel bars as grab bars.  Use non-skid mats or decals in the tub or shower.  If you need to sit down in the shower, use a plastic, non-slip stool.  Keep the floor dry. Clean up any water that spills on the floor as soon as it happens.  Remove soap buildup in the tub or shower regularly.  Attach bath mats securely with double-sided non-slip rug tape.  Do not have throw rugs and other things on the floor that can make you trip. What can I do in the bedroom?  Use night lights.  Make sure that you have a light by your bed that is easy to reach.  Do not use any sheets or blankets that are too big for your bed. They should not hang down onto the floor.  Have a firm chair that has side arms. You can use this for support while you get dressed.  Do not have throw rugs and other things on the floor that can make you trip. What can I do in the kitchen?  Clean up any spills right away.  Avoid walking on wet floors.  Keep items that you use a lot in easy-to-reach  places.  If you need to reach something above you, use a strong step stool that has a grab bar.  Keep electrical cords out of the way.  Do not use floor polish or wax that makes floors slippery. If you must use wax, use non-skid floor wax.  Do not have throw rugs and other things on the floor that can make you trip. What can I do with my stairs?  Do not leave any items on the stairs.  Make sure that there are handrails on both sides of the stairs and use them. Fix handrails that are broken or loose. Make sure that handrails are as long as the stairways.  Check any carpeting to make sure that it is firmly attached to the stairs. Fix any carpet that is loose or worn.  Avoid having throw rugs at the top or bottom of the stairs. If you do have throw rugs, attach them to the floor with carpet tape.  Make sure that you have a light switch at the  top of the stairs and the bottom of the stairs. If you do not have them, ask someone to add them for you. What else can I do to help prevent falls?  Wear shoes that:  Do not have high heels.  Have rubber bottoms.  Are comfortable and fit you well.  Are closed at the toe. Do not wear sandals.  If you use a stepladder:  Make sure that it is fully opened. Do not climb a closed stepladder.  Make sure that both sides of the stepladder are locked into place.  Ask someone to hold it for you, if possible.  Clearly mark and make sure that you can see:  Any grab bars or handrails.  First and last steps.  Where the edge of each step is.  Use tools that help you move around (mobility aids) if they are needed. These include:  Canes.  Walkers.  Scooters.  Crutches.  Turn on the lights when you go into a dark area. Replace any light bulbs as soon as they burn out.  Set up your furniture so you have a clear path. Avoid moving your furniture around.  If any of your floors are uneven, fix them.  If there are any pets around you, be aware of where they are.  Review your medicines with your doctor. Some medicines can make you feel dizzy. This can increase your chance of falling. Ask your doctor what other things that you can do to help prevent falls. This information is not intended to replace advice given to you by your health care provider. Make sure you discuss any questions you have with your health care provider. Document Released: 03/20/2009 Document Revised: 10/30/2015 Document Reviewed: 06/28/2014 Elsevier Interactive Patient Education  2017 [employer].

## 2019-05-29 NOTE — Progress Notes (Signed)
This visit type was conducted due to national recommendations for restrictions regarding the COVID-19 Pandemic (e.g. social distancing). This format is felt to be most appropriate for this patient at this time. All issues noted in this document were discussed and addressed. No physical exam was performed (except for noted visual exam findings with Video Visits). The patient, [name], has given consent to perform this visit via video. Vital signs may be absent or patient reported.   Patient location:  At home   Nurse location:  [place] office  Subjective:   Emily Cochran is a 58 y.o. female who presents for Medicare Annual (Subsequent) preventive examination.  Review of Systems:  n/a Cardiac Risk Factors include: diabetes mellitus;hypertension;obesity (BMI >30kg/m2)     Objective:     Vitals: Ht _0  (1.575 m) Comment: per patient  Wt 212 lb (96.2 kg) Comment: per patient  BMI 38.78 kg/m   Body mass index is 38.78 kg/m.  Advanced Directives 05/29/2019 04/17/2018 03/16/2018 11/08/2016 08/27/2016 08/13/2016 07/12/2016  Does Patient Have a Medical Advance Directive? _1  No No  Would patient like information on creating a medical advance directive? - No - Patient declined - No - Patient declined No - Patient declined No - Patient declined No - Patient declined    Tobacco Social History   Tobacco Use  Smoking Status Never Smoker  Smokeless Tobacco Never Used     Counseling given: Not Answered   Clinical Intake:  Pre-visit preparation completed: Yes  Pain : 0-10 Pain Score: 8  Pain Type: Chronic pain Pain Location: Back Pain Orientation: Lower Pain Radiating Towards: down legs Pain Descriptors / Indicators: Aching, Shooting Pain Onset: More than a month ago Pain Frequency: Intermittent Pain Relieving Factors: goody powder pm helps  Pain Relieving Factors: goody powder pm helps  Nutritional Status: BMI > 30  Obese Nutritional Risks: None Diabetes:  Yes CBG done?: No Did pt. bring in CBG monitor from home?: No  How often do you need to have someone help you when you read instructions, pamphlets, or other written materials from your doctor or pharmacy?: 1 - Never What is the last grade level you completed in school?: 12th grade  Interpreter Needed?: No  Information entered by :: NAllen LPN  Past Medical History:  Diagnosis Date  . Asthma   . Bipolar 1 disorder ([place])   . Hypertension   . Kidney stones    Past Surgical History:  Procedure Laterality Date  . ABDOMINAL HYSTERECTOMY    . APPENDECTOMY    . CHOLECYSTECTOMY     Family History  Problem Relation Age of Onset  . Bipolar disorder Mother   . Schizophrenia Mother   . Diabetes Mother   . Cancer Mother   . Hypertension Mother   . Cancer Father   . Diabetes Father    Social History   Socioeconomic History  . Marital status: Single    Spouse name: Not on file  . Number of children: Not on file  . Years of education: Not on file  . Highest education level: Not on file  Occupational History  . Occupation: disability  Tobacco Use  . Smoking status: Never Smoker  . Smokeless tobacco: Never Used  Substance and Sexual Activity  . Alcohol use: No  . Drug use: No  . Sexual activity: Not Currently    Birth control/protection: Surgical  Other Topics Concern  . Not on file  Social History Narrative  . Not on  file   Social Determinants of Health   Financial Resource Strain: Low Risk   . Difficulty of Paying Living Expenses: Not hard at all  Food Insecurity: No Food Insecurity  . Worried About Charity fundraiser in the Last Year: Never true  . Ran Out of Food in the Last Year: Never true  Transportation Needs: No Transportation Needs  . Lack of Transportation (Medical): No  . Lack of Transportation (Non-Medical): No  Physical Activity: Sufficiently Active  . Days of Exercise per Week: 7 days  . Minutes of Exercise per Session: 30 min  Stress: No Stress  Concern Present  . Feeling of Stress : Not at all  Social Connections:   . Frequency of Communication with Friends and Family: Not on file  . Frequency of Social Gatherings with Friends and Family: Not on file  . Attends Religious Services: Not on file  . Active Member of Clubs or Organizations: Not on file  . Attends Archivist Meetings: Not on file  . Marital Status: Not on file    Outpatient Encounter Medications as of 05/29/2019  Medication Sig  . ACCU-CHEK GUIDE test strip CHECK BLOOD SUGAR UP TO 4 TIMES DAILY  . ACCU-CHEK SOFTCLIX LANCETS lancets CHECK BLOOD SUGAR UP TO 4 TIMES DAILY  . blood glucose meter kit and supplies KIT Dispense based on patient and insurance preference. Use up to four times daily as directed. (FOR ICD-10 E11.65).  . Diphenhydramine-APAP, sleep, (GOODY PM PO) Take 1 Package by mouth daily as needed (pain).   . fluticasone (FLONASE) 50 MCG/ACT nasal spray Place 2 sprays into both nostrils daily.  . metoprolol tartrate (LOPRESSOR) 25 MG tablet Take 1 tablet (25 mg total) by mouth 2 (two) times daily.  . pravastatin (PRAVACHOL) 20 MG tablet Take 1 tablet by mouth MWF  . pregabalin (LYRICA) 75 MG capsule Take 1 capsule (75 mg total) by mouth daily.  . QUEtiapine (SEROQUEL) 200 MG tablet Take 200 mg by mouth at bedtime.   . Semaglutide,0.25 or 0.5MG/DOS, (OZEMPIC, 0.25 OR 0.5 MG/DOSE,) 2 MG/1.5ML SOPN Inject 0.5 mg into the skin once a week.  . [DISCONTINUED] albuterol (PROVENTIL HFA;VENTOLIN HFA) 108 (90 Base) MCG/ACT inhaler Inhale 1-2 puffs into the lungs every 6 (six) hours as needed for wheezing or shortness of breath.  . [DISCONTINUED] amLODipine (NORVASC) 5 MG tablet Take 1 tablet (5 mg total) by mouth daily.   No facility-administered encounter medications on file as of 05/29/2019.    Activities of Daily Living In your present state of health, do you have any difficulty performing the following activities: 05/29/2019 05/24/2019  Hearing? N N    Vision? N N  Difficulty concentrating or making decisions? N N  Walking or climbing stairs? N N  Dressing or bathing? N N  Doing errands, shopping? N N  Preparing Food and eating ? N -  Using the Toilet? N -  In the past six months, have you accidently leaked urine? N -  Do you have problems with loss of bowel control? N -  Managing your Medications? N -  Managing your Finances? N -  Housekeeping or managing your Housekeeping? N -  Some recent data might be hidden    Patient Care Team: [name], FNP as PCP - General ([place]) Rex Kras, Claudette Stapler, RN as Case Manager    Assessment:   This is a routine wellness examination for Emily Cochran.  Exercise Activities and Dietary recommendations Current Exercise Habits: Home exercise routine,  Time (Minutes): 30, Frequency (Times/Week): 7, Weekly Exercise (Minutes/Week): 210  Goals    . Patient Stated     05/29/2019, wants to blood sugar, blood pressure and cholesterol under control       Fall Risk Fall Risk  05/29/2019 05/24/2019 11/22/2018 09/19/2018 04/19/2018  Falls in the past year? 0 0 0 0 0  Risk for fall due to : Medication side effect - - - -  Follow up Falls evaluation completed;Education provided;Falls prevention discussed - - - -   Is the patient's home free of loose throw rugs in walkways, pet beds, electrical cords, etc?   yes      Grab bars in the bathroom? yes      Handrails on the stairs?   yes      Adequate lighting?   yes  Timed Get Up and Go performed: n/a  Depression Screen PHQ 2/9 Scores 05/29/2019 05/24/2019 11/22/2018 09/19/2018  PHQ - 2 Score 0 0 0 0  PHQ- 9 Score 0 - - -     Cognitive Function     6CIT Screen 05/29/2019  What Year? 0 points  What month? 0 points  What time? 0 points  Count back from 20 0 points  Months in reverse 0 points  Repeat phrase 0 points  Total Score 0     There is no immunization history on file for this patient.  Qualifies for Shingles Vaccine?  yes  Screening Tests Health Maintenance  Topic Date Due  . PAP SMEAR-Modifier  05/18/1982  . MAMMOGRAM  05/19/2011  . COLONOSCOPY  05/19/2011  . INFLUENZA VACCINE  03/06/2020 (Originally 01/06/2019)  . TETANUS/TDAP  05/28/2020 (Originally 05/18/1980)  . URINE MICROALBUMIN  05/23/2020  . Hepatitis C Screening  Completed  . HIV Screening  Completed    Cancer Screenings: Lung: Low Dose CT Chest recommended if Age 42-80 years, 30 pack-year currently smoking OR have quit w/in 15years. Patient does not qualify. Breast:  Up to date on Mammogram? No   Up to date of Bone Density/Dexa? n/a Colorectal: scheduling  Additional Screenings: : Hepatitis C Screening: 04/19/2018     Plan:    Patient wants to keep blood sugars, blood pressure and cholesterol under control.   I have personally reviewed and noted the following in the patient's chart:   . Medical and social history . Use of alcohol, tobacco or illicit drugs  . Current medications and supplements . Functional ability and status . Nutritional status . Physical activity . Advanced directives . List of other physicians . Hospitalizations, surgeries, and ER visits in previous 12 months . Vitals . Screenings to include cognitive, depression, and falls . Referrals and appointments  In addition, I have reviewed and discussed with patient certain preventive protocols, quality metrics, and best practice recommendations. A written personalized care plan for preventive services as well as general preventive health recommendations were provided to patient.     Kellie Simmering, LPN  [id number]

## 2019-06-07 ENCOUNTER — Other Ambulatory Visit: Payer: Self-pay

## 2019-06-07 MED ORDER — ALBUTEROL SULFATE HFA 108 (90 BASE) MCG/ACT IN AERS: 2.0000 | INHALATION_SPRAY | Freq: Four times a day (QID) | RESPIRATORY_TRACT | 1 refills | Status: DC | PRN

## 2019-06-19 ENCOUNTER — Other Ambulatory Visit: Payer: Self-pay | Admitting: Nurse Practitioner

## 2019-06-19 MED ORDER — ALBUTEROL SULFATE HFA 108 (90 BASE) MCG/ACT IN AERS: 2.0000 | INHALATION_SPRAY | Freq: Four times a day (QID) | RESPIRATORY_TRACT | 1 refills | Status: DC | PRN

## 2019-06-30 ENCOUNTER — Other Ambulatory Visit: Payer: Self-pay | Admitting: Nurse Practitioner

## 2019-07-04 ENCOUNTER — Other Ambulatory Visit: Payer: Self-pay | Admitting: Nurse Practitioner

## 2019-07-11 ENCOUNTER — Other Ambulatory Visit: Payer: Self-pay | Admitting: Nurse Practitioner

## 2019-07-11 MED ORDER — ALBUTEROL SULFATE HFA 108 (90 BASE) MCG/ACT IN AERS: 2.0000 | INHALATION_SPRAY | Freq: Four times a day (QID) | RESPIRATORY_TRACT | 1 refills | Status: DC | PRN

## 2019-07-16 ENCOUNTER — Other Ambulatory Visit: Payer: Self-pay

## 2019-07-16 NOTE — Patient Outreach (Signed)
[facility] Care Management  07/16/2019  Emily Cochran 1961/02/23 [id number]   Medication Adherence call to Emily Cochran [employer] spoke with patient she is past due on Ozempic,patient explain she is using it every week and has pick up from the pharmacy for a three month supply,patient has no copay at this time. Emily Cochran is showing past due under [facility] Ins.   Emily Cochran CPhT Pharmacy Technician [facility] Care Management Direct Dial [phone]  Fax [phone] [email or web address]

## 2019-07-18 ENCOUNTER — Other Ambulatory Visit: Payer: Self-pay

## 2019-07-18 ENCOUNTER — Ambulatory Visit
Admission: RE | Admit: 2019-07-18 | Discharge: 2019-07-18 | Disposition: A | Discharge status: Home / Self Care | Payer: Medicaid Other | Source: Ambulatory Visit | Attending: Nurse Practitioner | Admitting: Nurse Practitioner

## 2019-07-18 DIAGNOSIS — Z1231 Encounter for screening mammogram for malignant neoplasm of breast: Secondary | ICD-10-CM | POA: Diagnosis not present

## 2019-08-03 ENCOUNTER — Ambulatory Visit (HOSPITAL_COMMUNITY)
Admission: EM | Admit: 2019-08-03 | Discharge: 2019-08-03 | Disposition: A | Discharge status: Home / Self Care | Payer: Medicare Other | Attending: Family Medicine | Admitting: Family Medicine

## 2019-08-03 ENCOUNTER — Ambulatory Visit (INDEPENDENT_AMBULATORY_CARE_PROVIDER_SITE_OTHER): Payer: Medicare Other

## 2019-08-03 ENCOUNTER — Other Ambulatory Visit: Payer: Self-pay

## 2019-08-03 DIAGNOSIS — R6883 Chills (without fever): Secondary | ICD-10-CM

## 2019-08-03 DIAGNOSIS — Z833 Family history of diabetes mellitus: Secondary | ICD-10-CM | POA: Diagnosis not present

## 2019-08-03 DIAGNOSIS — Z87442 Personal history of urinary calculi: Secondary | ICD-10-CM | POA: Diagnosis not present

## 2019-08-03 DIAGNOSIS — R6889 Other general symptoms and signs: Secondary | ICD-10-CM | POA: Diagnosis not present

## 2019-08-03 DIAGNOSIS — Z20822 Contact with and (suspected) exposure to covid-19: Secondary | ICD-10-CM | POA: Insufficient documentation

## 2019-08-03 DIAGNOSIS — Z8249 Family history of ischemic heart disease and other diseases of the circulatory system: Secondary | ICD-10-CM | POA: Insufficient documentation

## 2019-08-03 DIAGNOSIS — Z886 Allergy status to analgesic agent status: Secondary | ICD-10-CM | POA: Diagnosis not present

## 2019-08-03 DIAGNOSIS — R079 Chest pain, unspecified: Secondary | ICD-10-CM | POA: Diagnosis not present

## 2019-08-03 DIAGNOSIS — J45909 Unspecified asthma, uncomplicated: Secondary | ICD-10-CM | POA: Diagnosis not present

## 2019-08-03 DIAGNOSIS — Z91013 Allergy to seafood: Secondary | ICD-10-CM | POA: Insufficient documentation

## 2019-08-03 DIAGNOSIS — B349 Viral infection, unspecified: Secondary | ICD-10-CM

## 2019-08-03 DIAGNOSIS — R519 Headache, unspecified: Secondary | ICD-10-CM

## 2019-08-03 DIAGNOSIS — I10 Essential (primary) hypertension: Secondary | ICD-10-CM | POA: Insufficient documentation

## 2019-08-03 DIAGNOSIS — Z79899 Other long term (current) drug therapy: Secondary | ICD-10-CM | POA: Insufficient documentation

## 2019-08-03 LAB — INFLUENZA A AND B ANTIGEN (CONVERTED LAB)
Influenza A Ag: NEGATIVE
Influenza B Ag: NEGATIVE

## 2019-08-03 LAB — POC INFLUENZA A AND B ANTIGEN (URGENT CARE ONLY)
Influenza A Ag: NEGATIVE
Influenza B Ag: NEGATIVE

## 2019-08-03 MED ORDER — DEXAMETHASONE SODIUM PHOSPHATE 10 MG/ML IJ SOLN
10.0000 mg | Freq: Once | INTRAMUSCULAR | Status: AC
  Administered 2019-08-03: 10 mg via INTRAMUSCULAR

## 2019-08-03 MED ORDER — DEXAMETHASONE SODIUM PHOSPHATE 10 MG/ML IJ SOLN
INTRAMUSCULAR | Status: AC
  Filled 2019-08-03: qty 1

## 2019-08-03 NOTE — ED Provider Notes (Signed)
[facility]    CSN: [id number] Arrival date & time: 08/03/19  1511      History   Chief Complaint Chief Complaint  Patient presents with  . Headache  . Chills    HPI Emily Cochran is a 59 y.o. female.   Reports sudden onset of headache, chills, body aches, sore throat since yesterday.  Has made no attempts to treat at home.  Also reports loss of appetite, but maintains that sense of smell and taste is intact.  Has been occurring since yesterday.  Denies known sick contacts.  Denies fever, nausea, vomiting, diarrhea, rash, other symptoms.  ROS per HPI  The history is provided by the patient.    Past Medical History:  Diagnosis Date  . Asthma   . Bipolar 1 disorder ([place])   . Hypertension   . Kidney stones     Patient Active Problem List   Diagnosis Date Noted  . Schizophrenia ([place]) 04/28/2018  . Essential hypertension 04/28/2018    Past Surgical History:  Procedure Laterality Date  . ABDOMINAL HYSTERECTOMY    . APPENDECTOMY    . CHOLECYSTECTOMY      OB History   No obstetric history on file.      Home Medications    Prior to Admission medications   Medication Sig Start Date End Date Taking? Authorizing Provider  albuterol (VENTOLIN HFA) 108 (90 Base) MCG/ACT inhaler INHALE 2 PUFFS INTO THE LUNGS EVERY 6 HOURS AS NEEDED FOR WHEEZING OR SHORTNESS OF BREATH 07/11/19  Yes [name], FNP  amLODipine (NORVASC) 5 MG tablet Take 1 tablet (5 mg total) by mouth daily. 05/29/19 05/28/20 Yes [name], FNP  metoprolol tartrate (LOPRESSOR) 25 MG tablet Take 1 tablet (25 mg total) by mouth 2 (two) times daily. 05/24/19 05/23/20 Yes [name], FNP  pravastatin (PRAVACHOL) 20 MG tablet Take 1 tablet by mouth MWF 05/24/19  Yes [name], FNP  pregabalin (LYRICA) 75 MG capsule Take 1 capsule (75 mg total) by mouth daily. 05/24/19  Yes [name], FNP  Semaglutide,0.25 or 0.5MG/DOS, (OZEMPIC, 0.25 OR 0.5 MG/DOSE,) 2 MG/1.5ML SOPN Inject 0.5 mg  into the skin once a week. 05/28/19  Yes [name], FNP  ACCU-CHEK GUIDE test strip CHECK BLOOD SUGAR UP TO 4 TIMES DAILY 04/20/18   [provider]  ACCU-CHEK SOFTCLIX LANCETS lancets CHECK BLOOD SUGAR UP TO 4 TIMES DAILY 07/03/18   [name], FNP  blood glucose meter kit and supplies KIT Dispense based on patient and insurance preference. Use up to four times daily as directed. (FOR ICD-10 E11.65). 04/20/18   [name], FNP  Diphenhydramine-APAP, sleep, (GOODY PM PO) Take 1 Package by mouth daily as needed (pain).     [provider]  fluticasone (FLONASE) 50 MCG/ACT nasal spray Place 2 sprays into both nostrils daily. 08/13/16   Doristine Devoid, PA-C  QUEtiapine (SEROQUEL) 200 MG tablet Take 200 mg by mouth at bedtime.  07/24/16   [provider]    Family History Family History  Problem Relation Age of Onset  . Bipolar disorder Mother   . Schizophrenia Mother   . Diabetes Mother   . Cancer Mother   . Hypertension Mother   . Cancer Father   . Diabetes Father     Social History Social History   Tobacco Use  . Smoking status: Never Smoker  . Smokeless tobacco: Never Used  Substance Use Topics  . Alcohol use: No  . Drug use: No  Allergies   Coconut flavor, Shellfish allergy, Ultram [tramadol], Aspirin, and Eggs or egg-derived products   Review of Systems Review of Systems   Physical Exam Triage Vital Signs ED Triage Vitals  Enc Vitals Group     BP 08/03/19 1542 (!) 142/87     Pulse Rate 08/03/19 1542 77     Resp 08/03/19 1542 20     Temp 08/03/19 1542 99.9 F (37.7 C)     Temp Source 08/03/19 1542 Oral     SpO2 08/03/19 1542 98 %     Weight --      Height --      Head Circumference --      Peak Flow --      Pain Score 08/03/19 1537 10     Pain Loc --      Pain Edu? --      Excl. in [place]? --    No data found.  Updated Vital Signs BP (!) 142/87 (BP Location: Left Arm)   Pulse 77   Temp 99.9 F (37.7 C) (Oral)    Resp 20   SpO2 98%       Physical Exam Vitals and nursing note reviewed.  Constitutional:      General: She is not in acute distress.    Appearance: Normal appearance. She is well-developed. She is obese.  HENT:     Head: Normocephalic and atraumatic.     Nose: Congestion present.     Mouth/Throat:     Mouth: Mucous membranes are moist.     Pharynx: Posterior oropharyngeal erythema present.  Eyes:     Conjunctiva/sclera: Conjunctivae normal.  Cardiovascular:     Rate and Rhythm: Normal rate and regular rhythm.     Heart sounds: Normal heart sounds. No murmur.  Pulmonary:     Effort: Pulmonary effort is normal. No respiratory distress.     Breath sounds: No stridor. Wheezing present. No rhonchi or rales.  Abdominal:     General: Bowel sounds are normal. There is no distension.     Palpations: Abdomen is soft.     Tenderness: There is no abdominal tenderness. There is no guarding.  Musculoskeletal:        General: Normal range of motion.     Cervical back: Neck supple.  Skin:    General: Skin is warm and dry.     Capillary Refill: Capillary refill takes less than 2 seconds.  Neurological:     General: No focal deficit present.     Mental Status: She is alert and oriented to person, place, and time.  Psychiatric:        Mood and Affect: Mood normal.        Behavior: Behavior normal.      UC Treatments / Results  Labs (all labs ordered are listed, but only abnormal results are displayed) Labs Reviewed  NOVEL CORONAVIRUS, NAA (HOSP ORDER, SEND-OUT TO REF LAB; TAT 18-24 HRS)  INFLUENZA A AND B ANTIGEN (CONVERTED LAB)    EKG   Radiology DG Chest 2 View  Result Date: 08/03/2019 CLINICAL DATA:  Chest pain.  Recent COVID-19 positive EXAM: CHEST - 2 VIEW COMPARISON:  April 23, 2017 FINDINGS: Lungs are clear. Heart size and pulmonary vascularity are normal. No adenopathy. No pneumothorax. There is midthoracic dextroscoliosis. There is degenerative change in the  thoracic spine. IMPRESSION: Lungs clear.  Cardiac silhouette normal. Electronically Signed   By: Lowella Grip III M.D.   On: 08/03/2019 16:41  Procedures Procedures (including critical care time)  Medications Ordered in UC Medications  dexamethasone (DECADRON) injection 10 mg (10 mg Intramuscular Given 08/03/19 1612)    Initial Impression / Assessment and Plan / UC Course  I have reviewed the triage vital signs and the nursing notes.  Pertinent labs & imaging results that were available during my care of the patient were reviewed by me and considered in my medical decision making (see chart for details).     Send out Covid test today, pending and will inform patient of results.  Rapid flu in office today negative.  Chest x-ray negative today as well.  Instructed to quarantine until Covid results are back and negative.  Decadron 10 mg IM given in office today for wheezing.  Instructed to go to the ER with further shortness of breath, high fever, severe diarrhea, or other concerning symptoms. Final Clinical Impressions(s) / UC Diagnoses   Final diagnoses:  Chills  Nonintractable headache, unspecified chronicity pattern, unspecified headache type  Flu-like symptoms  Viral illness     Discharge Instructions     Your COVID test is pending.  You should self quarantine until your test result is back and is negative.    Take Tylenol as needed for fever or discomfort.  Rest and keep yourself hydrated.    Go to the emergency department if you develop high fever, shortness of breath, severe diarrhea, or other concerning symptoms.       ED Prescriptions    None     I have reviewed the PDMP during this encounter.   Faustino Congress, NP 08/03/19 1744

## 2019-08-03 NOTE — Discharge Instructions (Addendum)
Your COVID test is pending.  You should self quarantine until your test result is back and is negative.    Take Tylenol as needed for fever or discomfort.  Rest and keep yourself hydrated.    Go to the emergency department if you develop high fever, shortness of breath, severe diarrhea, or other concerning symptoms.    

## 2019-08-03 NOTE — ED Triage Notes (Signed)
Pt c/o chest congestion, discomfort, HA x2 days. Also c/o chills RLQ abd discomfort and loss of taste/smell today.  Denies cough, sore throat, n/v/d.  Able to speak full sentences w/o difficulty.  CBG per pt yesterday was 101

## 2019-08-04 LAB — NOVEL CORONAVIRUS, NAA (HOSP ORDER, SEND-OUT TO REF LAB; TAT 18-24 HRS): SARS-CoV-2, NAA: NOT DETECTED

## 2019-08-07 DIAGNOSIS — Z794 Long term (current) use of insulin: Secondary | ICD-10-CM | POA: Diagnosis not present

## 2019-08-07 DIAGNOSIS — E119 Type 2 diabetes mellitus without complications: Secondary | ICD-10-CM | POA: Diagnosis not present

## 2019-08-07 DIAGNOSIS — Z961 Presence of intraocular lens: Secondary | ICD-10-CM | POA: Diagnosis not present

## 2019-08-22 ENCOUNTER — Ambulatory Visit: Payer: Medicare Other

## 2019-08-22 ENCOUNTER — Ambulatory Visit: Payer: Medicare Other | Admitting: Nurse Practitioner

## 2019-08-22 ENCOUNTER — Telehealth: Payer: Self-pay

## 2019-08-22 NOTE — Telephone Encounter (Signed)
This nurse contacted patient due to her missing our scheduled AWV this morning. She states that she had cancelled the appointment 2 days ago and again when she received a reminder call. States that she had a family emergency and will reschedule when she gets back.

## 2019-08-30 ENCOUNTER — Telehealth: Payer: Self-pay

## 2019-08-30 NOTE — Telephone Encounter (Signed)
Patient called to reschedule her appt she has been traveling out of [place] so we made her a virtual appt per JM. Patient consented to virtual appt. YL,RMA

## 2019-09-03 ENCOUNTER — Other Ambulatory Visit: Payer: Self-pay

## 2019-09-03 ENCOUNTER — Telehealth (INDEPENDENT_AMBULATORY_CARE_PROVIDER_SITE_OTHER): Payer: Medicare Other | Admitting: Nurse Practitioner

## 2019-09-03 ENCOUNTER — Telehealth: Payer: Self-pay

## 2019-09-03 DIAGNOSIS — M25562 Pain in left knee: Secondary | ICD-10-CM | POA: Diagnosis not present

## 2019-09-03 DIAGNOSIS — E119 Type 2 diabetes mellitus without complications: Secondary | ICD-10-CM | POA: Diagnosis not present

## 2019-09-03 DIAGNOSIS — E559 Vitamin D deficiency, unspecified: Secondary | ICD-10-CM

## 2019-09-03 DIAGNOSIS — M797 Fibromyalgia: Secondary | ICD-10-CM

## 2019-09-03 DIAGNOSIS — M545 Low back pain, unspecified: Secondary | ICD-10-CM

## 2019-09-03 DIAGNOSIS — R946 Abnormal results of thyroid function studies: Secondary | ICD-10-CM

## 2019-09-03 DIAGNOSIS — M25561 Pain in right knee: Secondary | ICD-10-CM | POA: Diagnosis not present

## 2019-09-03 DIAGNOSIS — I10 Essential (primary) hypertension: Secondary | ICD-10-CM

## 2019-09-03 DIAGNOSIS — G8929 Other chronic pain: Secondary | ICD-10-CM

## 2019-09-03 MED ORDER — METOPROLOL TARTRATE 25 MG PO TABS: 25.0000 mg | ORAL_TABLET | Freq: Two times a day (BID) | ORAL | 1 refills | Status: DC

## 2019-09-03 MED ORDER — PRAVASTATIN SODIUM 20 MG PO TABS: ORAL_TABLET | ORAL | 1 refills | Status: DC

## 2019-09-03 NOTE — Progress Notes (Signed)
Virtual Visit via Video   This visit type was conducted due to national recommendations for restrictions regarding the COVID-19 Pandemic (e.g. social distancing) in an effort to limit this patient's exposure and mitigate transmission in our community.  Due to her co-morbid illnesses, this patient is at least at moderate    Virtual Visit via Video   This visit type was conducted due to national recommendations for restrictions regarding the COVID-19 Pandemic (e.g. social distancing) in an effort to limit this patient's exposure and mitigate transmission in our community.  Patients identity confirmed using two different identifiers.  This format is felt to be most appropriate for this patient at this time.  All issues noted in this document were discussed and addressed.  No physical exam was performed (except for noted visual exam findings with Video Visits).    Date:  10/02/2019   ID:  Emily Cochran, DOB March 26, 1961, MRN [id number]  Patient Location:  Home - spoke with Elaina Hoops  Provider location:   Office    Chief Complaint:  Diabetes follow up  History of Present Illness:    Emily Cochran is a 59 y.o. female who presents via video conferencing for a telehealth visit today.    The patient does not have symptoms concerning for COVID-19 infection (fever, chills, cough, or new shortness of breath).   Diabetes She presents for her follow-up diabetic visit. She has type 2 diabetes mellitus. Her disease course has been stable. Pertinent negatives for hypoglycemia include no dizziness. There are no diabetic associated symptoms. Pertinent negatives for diabetes include no blurred vision. There are no hypoglycemic complications. Symptoms are stable. There are no diabetic complications. Risk factors for coronary artery disease include diabetes mellitus, sedentary lifestyle and obesity. Current diabetic treatment includes oral agent (dual therapy). She is compliant with treatment  most of the time. When asked about meal planning, she reported none. She has not had a previous visit with a dietitian. She participates in exercise intermittently. There is no change in her home blood glucose trend. (Blood sugar 80-120) An ACE inhibitor/angiotensin II receptor blocker is being taken. She does not see a podiatrist.Eye exam is not current.  Knee Pain  The incident occurred more than 1 week ago. The pain is present in the right knee and left knee. The quality of the pain is described as aching. The pain has been intermittent since onset. Pertinent negatives include no tingling. Treatments tried: she is using a gel.     Past Medical History:  Diagnosis Date  . Asthma   . Bipolar 1 disorder ([place])   . Hypertension   . Kidney stones    Past Surgical History:  Procedure Laterality Date  . ABDOMINAL HYSTERECTOMY    . APPENDECTOMY    . CHOLECYSTECTOMY       Current Meds  Medication Sig  . ACCU-CHEK GUIDE test strip CHECK BLOOD SUGAR UP TO 4 TIMES DAILY  . ACCU-CHEK SOFTCLIX LANCETS lancets CHECK BLOOD SUGAR UP TO 4 TIMES DAILY  . albuterol (VENTOLIN HFA) 108 (90 Base) MCG/ACT inhaler INHALE 2 PUFFS INTO THE LUNGS EVERY 6 HOURS AS NEEDED FOR WHEEZING OR SHORTNESS OF BREATH  . amLODipine (NORVASC) 5 MG tablet Take 1 tablet (5 mg total) by mouth daily.  . blood glucose meter kit and supplies KIT Dispense based on patient and insurance preference. Use up to four times daily as directed. (FOR ICD-10 E11.65).  . Diphenhydramine-APAP, sleep, (GOODY PM PO) Take 1 Package by mouth daily as  needed (pain).   . fluticasone (FLONASE) 50 MCG/ACT nasal spray Place 2 sprays into both nostrils daily.  . metoprolol tartrate (LOPRESSOR) 25 MG tablet Take 1 tablet (25 mg total) by mouth 2 (two) times daily.  . pravastatin (PRAVACHOL) 20 MG tablet Take 1 tablet by mouth MWF  . pregabalin (LYRICA) 75 MG capsule Take 1 capsule (75 mg total) by mouth daily.  . QUEtiapine (SEROQUEL) 200 MG tablet Take  200 mg by mouth at bedtime.   . Semaglutide,0.25 or 0.5MG/DOS, (OZEMPIC, 0.25 OR 0.5 MG/DOSE,) 2 MG/1.5ML SOPN Inject 0.5 mg into the skin once a week.  . [DISCONTINUED] metoprolol tartrate (LOPRESSOR) 25 MG tablet Take 1 tablet (25 mg total) by mouth 2 (two) times daily.  . [DISCONTINUED] pravastatin (PRAVACHOL) 20 MG tablet Take 1 tablet by mouth MWF     Allergies:   Coconut flavor, Shellfish allergy, Ultram [tramadol], Aspirin, and Eggs or egg-derived products   Social History   Tobacco Use  . Smoking status: Never Smoker  . Smokeless tobacco: Never Used  Substance Use Topics  . Alcohol use: No  . Drug use: No     Family Hx: The patient's family history includes Bipolar disorder in her mother; Cancer in her father and mother; Diabetes in her father and mother; Hypertension in her mother; Schizophrenia in her mother.  ROS:   Please see the history of present illness.    Review of Systems  Constitutional: Negative.   Eyes: Negative for blurred vision.  Respiratory: Negative.   Cardiovascular: Negative.   Neurological: Negative.  Negative for dizziness and tingling.  Psychiatric/Behavioral: Negative.  Negative for depression.    All other systems reviewed and are negative.   Labs/Other Tests and Data Reviewed:    Recent Labs: 01/31/2019: TSH 0.44 09/04/2019: ALT 9; BUN 11; Creatinine, Ser 0.73; Potassium 4.1; Sodium 139   Recent Lipid Panel Lab Results  Component Value Date/Time   CHOL 150 09/04/2019 10:27 AM   TRIG 98 09/04/2019 10:27 AM   HDL 49 09/04/2019 10:27 AM   CHOLHDL 3.1 09/04/2019 10:27 AM   LDLCALC 83 09/04/2019 10:27 AM    Wt Readings from Last 3 Encounters:  05/29/19 212 lb (96.2 kg)  05/24/19 212 lb 12.8 oz (96.5 kg)  01/31/19 203 lb 3.2 oz (92.2 kg)     Exam:    Vital Signs:  There were no vitals taken for this visit.    Physical Exam  Constitutional: She is oriented to person, place, and time and well-developed, well-nourished, and in no  distress.  Neurological: She is alert and oriented to person, place, and time.  Psychiatric: Mood, memory, affect and judgment normal.    ASSESSMENT & PLAN:    1. Type 2 diabetes mellitus without complication, without long-term current use of insulin (HCC)  Chronic, controlled  Continue with current medications  Encouraged to limit intake of sugary foods and drinks  Encouraged to increase physical activity to 150 minutes per week as tolerated - CMP14 + Anion Gap; Future - Lipid Profile; Future - Hemoglobin A1c; Future - Semaglutide,0.25 or 0.5MG/DOS, (OZEMPIC, 0.25 OR 0.5 MG/DOSE,) 2 MG/1.5ML SOPN; Inject 0.5 mg into the skin once a week.  Dispense: 1 pen; Refill: 5  2. Vitamin D deficiency  Will check vitamin D level and supplement as needed.     Also encouraged to spend 15 minutes in the sun daily.   3. Essential hypertension . B/P is controlled.  . CMP ordered to check renal function.  . The  importance of regular exercise and dietary modification was stressed to the patient.  . Stressed importance of losing ten percent of her body weight to help with B/P control.  - CMP14 + Anion Gap; Future  4. Chronic pain of both knees  She is having bilateral knee pain and would like a referral to orthopedics  She is currently in [place] and is planning to come down at some time in the next couple of months - Ambulatory referral to Orthopedic Surgery   COVID-19 Education: The signs and symptoms of COVID-19 were discussed with the patient and how to seek care for testing (follow up with PCP or arrange E-visit).  The importance of social distancing was discussed today.  Patient Risk:   After full review of this patients clinical status, I feel that they are at least moderate risk at this time.  Time:   Today, I have spent 14 minutes/ seconds with the patient with telehealth technology discussing above diagnoses.     Medication Adjustments/Labs and Tests Ordered: Current  medicines are reviewed at length with the patient today.  Concerns regarding medicines are outlined above.   Tests Ordered: Orders Placed This Encounter  Procedures  . DG Knee Complete 4 Views Left  . DG Knee Complete 4 Views Right  . DG Lumbar Spine Complete  . Sed Rate (ESR)  . Autoimmune Profile  . CMP14+EGFR  . Hemoglobin A1c  . Lipid panel    Medication Changes: Meds ordered this encounter  Medications  . pravastatin (PRAVACHOL) 20 MG tablet    Sig: Take 1 tablet by mouth MWF    Dispense:  45 tablet    Refill:  1  . metoprolol tartrate (LOPRESSOR) 25 MG tablet    Sig: Take 1 tablet (25 mg total) by mouth 2 (two) times daily.    Dispense:  180 tablet    Refill:  1    Disposition:  Follow up in 3 month(s)  Signed, [name], FNP   isk for complications without adequate follow up.  This format is felt to be most appropriate for this patient at this time.  All issues noted in this document were discussed and addressed.  A limited physical exam was performed with this format.    This visit type was conducted due to national recommendations for restrictions regarding the COVID-19 Pandemic (e.g. social distancing) in an effort to limit this patient's exposure and mitigate transmission in our community.  Patients identity confirmed using two different identifiers.  This format is felt to be most appropriate for this patient at this time.  All issues noted in this document were discussed and addressed.  No physical exam was performed (except for noted visual exam findings with Video Visits).    Date:  10/02/2019   ID:  Emily Cochran, DOB [birth year], MRN [id number]  Patient Location:  Bus - spoke with Elaina Hoops  Provider location:   Office    Chief Complaint:  Bilateral knee pain and diabetes follow up  History of Present Illness:    Emily Cochran is a 59 y.o. female who presents via video conferencing for a telehealth visit today.    The patient does not  have symptoms concerning for COVID-19 infection (fever, chills, cough, or new shortness of breath).   Hypertension This is a chronic problem. The current episode started more than 1 year ago. The problem is controlled. Pertinent negatives include no anxiety. Risk factors for coronary artery disease include obesity, sedentary lifestyle and diabetes  mellitus.  Knee Pain  The incident occurred more than 1 week ago. There was no injury mechanism. Pain location: bilateral knee medial aspect. The quality of the pain is described as aching. Pertinent negatives include no loss of sensation, numbness or tingling. She has tried nothing for the symptoms.     Past Medical History:  Diagnosis Date  . Asthma   . Bipolar 1 disorder ([place])   . Hypertension   . Kidney stones    Past Surgical History:  Procedure Laterality Date  . ABDOMINAL HYSTERECTOMY    . APPENDECTOMY    . CHOLECYSTECTOMY       Current Meds  Medication Sig  . ACCU-CHEK GUIDE test strip CHECK BLOOD SUGAR UP TO 4 TIMES DAILY  . ACCU-CHEK SOFTCLIX LANCETS lancets CHECK BLOOD SUGAR UP TO 4 TIMES DAILY  . albuterol (VENTOLIN HFA) 108 (90 Base) MCG/ACT inhaler INHALE 2 PUFFS INTO THE LUNGS EVERY 6 HOURS AS NEEDED FOR WHEEZING OR SHORTNESS OF BREATH  . amLODipine (NORVASC) 5 MG tablet Take 1 tablet (5 mg total) by mouth daily.  . blood glucose meter kit and supplies KIT Dispense based on patient and insurance preference. Use up to four times daily as directed. (FOR ICD-10 E11.65).  . Diphenhydramine-APAP, sleep, (GOODY PM PO) Take 1 Package by mouth daily as needed (pain).   . fluticasone (FLONASE) 50 MCG/ACT nasal spray Place 2 sprays into both nostrils daily.  . metoprolol tartrate (LOPRESSOR) 25 MG tablet Take 1 tablet (25 mg total) by mouth 2 (two) times daily.  . pravastatin (PRAVACHOL) 20 MG tablet Take 1 tablet by mouth MWF  . pregabalin (LYRICA) 75 MG capsule Take 1 capsule (75 mg total) by mouth daily.  . QUEtiapine (SEROQUEL)  200 MG tablet Take 200 mg by mouth at bedtime.   . Semaglutide,0.25 or 0.5MG/DOS, (OZEMPIC, 0.25 OR 0.5 MG/DOSE,) 2 MG/1.5ML SOPN Inject 0.5 mg into the skin once a week.  . [DISCONTINUED] metoprolol tartrate (LOPRESSOR) 25 MG tablet Take 1 tablet (25 mg total) by mouth 2 (two) times daily.  . [DISCONTINUED] pravastatin (PRAVACHOL) 20 MG tablet Take 1 tablet by mouth MWF     Allergies:   Coconut flavor, Shellfish allergy, Ultram [tramadol], Aspirin, and Eggs or egg-derived products   Social History   Tobacco Use  . Smoking status: Never Smoker  . Smokeless tobacco: Never Used  Substance Use Topics  . Alcohol use: No  . Drug use: No     Family Hx: The patient's family history includes Bipolar disorder in her mother; Cancer in her father and mother; Diabetes in her father and mother; Hypertension in her mother; Schizophrenia in her mother.  ROS:   Please see the history of present illness.    Review of Systems  Constitutional: Negative.   Respiratory: Negative.   Cardiovascular: Negative.   Neurological: Negative for dizziness, tingling and numbness.    All other systems reviewed and are negative.   Labs/Other Tests and Data Reviewed:    Recent Labs: 01/31/2019: TSH 0.44 09/04/2019: ALT 9; BUN 11; Creatinine, Ser 0.73; Potassium 4.1; Sodium 139   Recent Lipid Panel Lab Results  Component Value Date/Time   CHOL 150 09/04/2019 10:27 AM   TRIG 98 09/04/2019 10:27 AM   HDL 49 09/04/2019 10:27 AM   CHOLHDL 3.1 09/04/2019 10:27 AM   LDLCALC 83 09/04/2019 10:27 AM    Wt Readings from Last 3 Encounters:  05/29/19 212 lb (96.2 kg)  05/24/19 212 lb 12.8 oz (96.5 kg)  01/31/19  203 lb 3.2 oz (92.2 kg)     Exam:    Vital Signs:  There were no vitals taken for this visit.    Physical Exam    Virtual Visit via Video   This visit type was conducted due to national recommendations for restrictions regarding the COVID-19 Pandemic (e.g. social distancing) in an effort to  limit this patient's exposure and mitigate transmission in our community.  Patients identity confirmed using two different identifiers.  This format is felt to be most appropriate for this patient at this time.  All issues noted in this document were discussed and addressed.  No physical exam was performed (except for noted visual exam findings with Video Visits).    Date:  10/02/2019   ID:  Emily Cochran, DOB Jul 17, 1960, MRN [id number]  Patient Location:  Home - spoke with Elaina Hoops  Provider location:   Office    Chief Complaint:  Diabetes follow up  History of Present Illness:    Emily Cochran is a 59 y.o. female who presents via video conferencing for a telehealth visit today.    The patient does not have symptoms concerning for COVID-19 infection (fever, chills, cough, or new shortness of breath).   Diabetes She presents for her follow-up diabetic visit. She has type 2 diabetes mellitus. Her disease course has been stable. Pertinent negatives for hypoglycemia include no dizziness. There are no diabetic associated symptoms. Pertinent negatives for diabetes include no blurred vision. There are no hypoglycemic complications. Symptoms are stable. There are no diabetic complications. Risk factors for coronary artery disease include diabetes mellitus, sedentary lifestyle and obesity. Current diabetic treatment includes oral agent (dual therapy). She is compliant with treatment most of the time. When asked about meal planning, she reported none. She has not had a previous visit with a dietitian. She participates in exercise intermittently. There is no change in her home blood glucose trend. (Blood sugar 80-120) An ACE inhibitor/angiotensin II receptor blocker is being taken. She does not see a podiatrist.Eye exam is not current.  Knee Pain  The incident occurred more than 1 week ago. The pain is present in the right knee and left knee. The quality of the pain is described as aching.  The pain has been intermittent since onset. Pertinent negatives include no tingling. Treatments tried: she is using a gel.     Past Medical History:  Diagnosis Date  . Asthma   . Bipolar 1 disorder ([place])   . Hypertension   . Kidney stones    Past Surgical History:  Procedure Laterality Date  . ABDOMINAL HYSTERECTOMY    . APPENDECTOMY    . CHOLECYSTECTOMY       Current Meds  Medication Sig  . ACCU-CHEK GUIDE test strip CHECK BLOOD SUGAR UP TO 4 TIMES DAILY  . ACCU-CHEK SOFTCLIX LANCETS lancets CHECK BLOOD SUGAR UP TO 4 TIMES DAILY  . albuterol (VENTOLIN HFA) 108 (90 Base) MCG/ACT inhaler INHALE 2 PUFFS INTO THE LUNGS EVERY 6 HOURS AS NEEDED FOR WHEEZING OR SHORTNESS OF BREATH  . amLODipine (NORVASC) 5 MG tablet Take 1 tablet (5 mg total) by mouth daily.  . blood glucose meter kit and supplies KIT Dispense based on patient and insurance preference. Use up to four times daily as directed. (FOR ICD-10 E11.65).  . Diphenhydramine-APAP, sleep, (GOODY PM PO) Take 1 Package by mouth daily as needed (pain).   . fluticasone (FLONASE) 50 MCG/ACT nasal spray Place 2 sprays into both nostrils daily.  . metoprolol  tartrate (LOPRESSOR) 25 MG tablet Take 1 tablet (25 mg total) by mouth 2 (two) times daily.  . pravastatin (PRAVACHOL) 20 MG tablet Take 1 tablet by mouth MWF  . pregabalin (LYRICA) 75 MG capsule Take 1 capsule (75 mg total) by mouth daily.  . QUEtiapine (SEROQUEL) 200 MG tablet Take 200 mg by mouth at bedtime.   . Semaglutide,0.25 or 0.5MG/DOS, (OZEMPIC, 0.25 OR 0.5 MG/DOSE,) 2 MG/1.5ML SOPN Inject 0.5 mg into the skin once a week.  . [DISCONTINUED] metoprolol tartrate (LOPRESSOR) 25 MG tablet Take 1 tablet (25 mg total) by mouth 2 (two) times daily.  . [DISCONTINUED] pravastatin (PRAVACHOL) 20 MG tablet Take 1 tablet by mouth MWF     Allergies:   Coconut flavor, Shellfish allergy, Ultram [tramadol], Aspirin, and Eggs or egg-derived products   Social History   Tobacco Use  .  Smoking status: Never Smoker  . Smokeless tobacco: Never Used  Substance Use Topics  . Alcohol use: No  . Drug use: No     Family Hx: The patient's family history includes Bipolar disorder in her mother; Cancer in her father and mother; Diabetes in her father and mother; Hypertension in her mother; Schizophrenia in her mother.  ROS:   Please see the history of present illness.    Review of Systems  Constitutional: Negative.   Eyes: Negative for blurred vision.  Respiratory: Negative.   Cardiovascular: Negative.   Neurological: Negative.  Negative for dizziness and tingling.  Psychiatric/Behavioral: Negative.  Negative for depression.    All other systems reviewed and are negative.   Labs/Other Tests and Data Reviewed:    Recent Labs: 01/31/2019: TSH 0.44 09/04/2019: ALT 9; BUN 11; Creatinine, Ser 0.73; Potassium 4.1; Sodium 139   Recent Lipid Panel Lab Results  Component Value Date/Time   CHOL 150 09/04/2019 10:27 AM   TRIG 98 09/04/2019 10:27 AM   HDL 49 09/04/2019 10:27 AM   CHOLHDL 3.1 09/04/2019 10:27 AM   LDLCALC 83 09/04/2019 10:27 AM    Wt Readings from Last 3 Encounters:  05/29/19 212 lb (96.2 kg)  05/24/19 212 lb 12.8 oz (96.5 kg)  01/31/19 203 lb 3.2 oz (92.2 kg)     Exam:    Vital Signs:  There were no vitals taken for this visit.    Physical Exam  Constitutional: She is oriented to person, place, and time and well-developed, well-nourished, and in no distress.  Neurological: She is alert and oriented to person, place, and time.  Psychiatric: Mood, memory, affect and judgment normal.    ASSESSMENT & PLAN:    1. Type 2 diabetes mellitus without complication, without long-term current use of insulin (HCC)  Chronic, controlled  Continue with current medications  Encouraged to limit intake of sugary foods and drinks  Encouraged to increase physical activity to 150 minutes per week as tolerated - CMP14 + Anion Gap; Future - Lipid Profile;  Future - Hemoglobin A1c; Future - Semaglutide,0.25 or 0.5MG/DOS, (OZEMPIC, 0.25 OR 0.5 MG/DOSE,) 2 MG/1.5ML SOPN; Inject 0.5 mg into the skin once a week.  Dispense: 1 pen; Refill: 5  2. Vitamin D deficiency  Will check vitamin D level and supplement as needed.     Also encouraged to spend 15 minutes in the sun daily.   3. Essential hypertension . B/P is controlled.  . CMP ordered to check renal function.  . The importance of regular exercise and dietary modification was stressed to the patient.  . Stressed importance of losing ten percent of  her body weight to help with B/P control.  - CMP14 + Anion Gap; Future  4. Chronic pain of both knees  I will send her for xrays of her knees pending the results will refer to orthopedics - Sed Rate (ESR); Future - Autoimmune Profile; Future - DG Knee Complete 4 Views Left; Future - DG Knee Complete 4 Views Right; Future  5. Abnormal thyroid function test  - metoprolol tartrate (LOPRESSOR) 25 MG tablet; Take 1 tablet (25 mg total) by mouth 2 (two) times daily.  Dispense: 180 tablet; Refill: 1  6. Fibromyalgia  Chronic, stable.   7. Chronic low back pain without sciatica, unspecified back pain laterality  She is having reoccurring low back pain  I will check sed rate she may have inflammation pending results will treat with prednisone - DG Lumbar Spine Complete; Future  COVID-19 Education: The signs and symptoms of COVID-19 were discussed with the patient and how to seek care for testing (follow up with PCP or arrange E-visit).  The importance of social distancing was discussed today.  Patient Risk:   After full review of this patients clinical status, I feel that they are at least moderate risk at this time.  Time:   Today, I have spent 14 minutes/ seconds with the patient with telehealth technology discussing above diagnoses.     Medication Adjustments/Labs and Tests Ordered: Current medicines are reviewed at length with the  patient today.  Concerns regarding medicines are outlined above.   Tests Ordered: Orders Placed This Encounter  Procedures  . DG Knee Complete 4 Views Left  . DG Knee Complete 4 Views Right  . DG Lumbar Spine Complete  . Sed Rate (ESR)  . Autoimmune Profile  . CMP14+EGFR  . Hemoglobin A1c  . Lipid panel    Medication Changes: Meds ordered this encounter  Medications  . pravastatin (PRAVACHOL) 20 MG tablet    Sig: Take 1 tablet by mouth MWF    Dispense:  45 tablet    Refill:  1  . metoprolol tartrate (LOPRESSOR) 25 MG tablet    Sig: Take 1 tablet (25 mg total) by mouth 2 (two) times daily.    Dispense:  180 tablet    Refill:  1    Disposition:  Follow up in 3 month(s)  Signed, [name], FNP    ASSESSMENT & PLAN:     There are no diagnoses linked to this encounter.   COVID-19 Education: The signs and symptoms of COVID-19 were discussed with the patient and how to seek care for testing (follow up with PCP or arrange E-visit).  The importance of social distancing was discussed today.  Patient Risk:   After full review of this patients clinical status, I feel that they are at least moderate risk at this time.  Time:   Today, I have spent 15 minutes/ seconds with the patient with telehealth technology discussing above diagnoses.     Medication Adjustments/Labs and Tests Ordered: Current medicines are reviewed at length with the patient today.  Concerns regarding medicines are outlined above.   Tests Ordered: Orders Placed This Encounter  Procedures  . DG Knee Complete 4 Views Left  . DG Knee Complete 4 Views Right  . DG Lumbar Spine Complete  . Sed Rate (ESR)  . Autoimmune Profile  . CMP14+EGFR  . Hemoglobin A1c  . Lipid panel    Medication Changes: Meds ordered this encounter  Medications  . pravastatin (PRAVACHOL) 20 MG tablet    Sig:  Take 1 tablet by mouth MWF    Dispense:  45 tablet    Refill:  1  . metoprolol tartrate (LOPRESSOR) 25 MG tablet     Sig: Take 1 tablet (25 mg total) by mouth 2 (two) times daily.    Dispense:  180 tablet    Refill:  1    Disposition:  Follow up prn  Signed, [name], FNP

## 2019-09-03 NOTE — Telephone Encounter (Signed)
CONSENT TO TELEHEALTH VISIT 09/03/19

## 2019-09-04 ENCOUNTER — Other Ambulatory Visit: Payer: Medicare Other

## 2019-09-04 ENCOUNTER — Other Ambulatory Visit: Payer: Self-pay

## 2019-09-04 DIAGNOSIS — M25561 Pain in right knee: Secondary | ICD-10-CM | POA: Diagnosis not present

## 2019-09-04 DIAGNOSIS — E119 Type 2 diabetes mellitus without complications: Secondary | ICD-10-CM | POA: Diagnosis not present

## 2019-09-04 DIAGNOSIS — M25562 Pain in left knee: Secondary | ICD-10-CM | POA: Diagnosis not present

## 2019-09-05 LAB — CMP14+EGFR
ALT: 9 IU/L (ref 0–32)
AST: 13 IU/L (ref 0–40)
Albumin/Globulin Ratio: 1.6 (ref 1.2–2.2)
Albumin: 4 g/dL (ref 3.8–4.9)
Alkaline Phosphatase: 92 IU/L (ref 39–117)
BUN/Creatinine Ratio: 15 (ref 9–23)
BUN: 11 mg/dL (ref 6–24)
Bilirubin Total: 0.3 mg/dL (ref 0.0–1.2)
CO2: 26 mmol/L (ref 20–29)
Calcium: 9.1 mg/dL (ref 8.7–10.2)
Chloride: 101 mmol/L (ref 96–106)
Creatinine, Ser: 0.73 mg/dL (ref 0.57–1.00)
GFR calc Af Amer: 105 mL/min/{1.73_m2} (ref >59)
GFR calc non Af Amer: 91 mL/min/{1.73_m2} (ref >59)
Globulin, Total: 2.5 g/dL (ref 1.5–4.5)
Glucose: 137 mg/dL — ABNORMAL HIGH (ref 65–99)
Potassium: 4.1 mmol/L (ref 3.5–5.2)
Sodium: 139 mmol/L (ref 134–144)
Total Protein: 6.5 g/dL (ref 6.0–8.5)

## 2019-09-05 LAB — HEMOGLOBIN A1C
Est. average glucose Bld gHb Est-mCnc: 137 mg/dL
Hgb A1c MFr Bld: 6.4 % — ABNORMAL HIGH (ref 4.8–5.6)

## 2019-09-05 LAB — SEDIMENTATION RATE: Sed Rate: 47 mm/hr — ABNORMAL HIGH (ref 0–40)

## 2019-09-05 LAB — LIPID PANEL
Chol/HDL Ratio: 3.1 ratio (ref 0.0–4.4)
Cholesterol, Total: 150 mg/dL (ref 100–199)
HDL: 49 mg/dL (ref >39)
LDL Chol Calc (NIH): 83 mg/dL (ref 0–99)
Triglycerides: 98 mg/dL (ref 0–149)
VLDL Cholesterol Cal: 18 mg/dL (ref 5–40)

## 2019-09-05 LAB — AUTOIMMUNE PROFILE
Anti Nuclear Antibody (ANA): NEGATIVE
Complement C3, Serum: 154 mg/dL (ref 82–167)
dsDNA Ab: <1 IU/mL (ref 0–9)

## 2019-10-02 ENCOUNTER — Encounter: Payer: Self-pay | Admitting: Nurse Practitioner

## 2019-10-30 ENCOUNTER — Other Ambulatory Visit: Payer: Self-pay | Admitting: Nurse Practitioner

## 2019-10-30 DIAGNOSIS — E119 Type 2 diabetes mellitus without complications: Secondary | ICD-10-CM

## 2019-11-25 ENCOUNTER — Other Ambulatory Visit: Payer: Self-pay | Admitting: Nurse Practitioner

## 2019-11-25 DIAGNOSIS — R946 Abnormal results of thyroid function studies: Secondary | ICD-10-CM

## 2019-11-26 NOTE — Progress Notes (Signed)
No show

## 2019-11-27 ENCOUNTER — Encounter: Payer: Medicare Other | Admitting: Nurse Practitioner

## 2019-12-04 ENCOUNTER — Ambulatory Visit: Payer: Medicare Other | Admitting: Nurse Practitioner

## 2019-12-18 ENCOUNTER — Encounter (HOSPITAL_COMMUNITY): Payer: Self-pay | Admitting: Emergency Medicine

## 2019-12-18 ENCOUNTER — Other Ambulatory Visit: Payer: Self-pay

## 2019-12-18 ENCOUNTER — Ambulatory Visit (HOSPITAL_COMMUNITY)
Admission: EM | Admit: 2019-12-18 | Discharge: 2019-12-18 | Disposition: A | Discharge status: Home / Self Care | Payer: Medicare Other | Attending: Emergency Medicine | Admitting: Emergency Medicine

## 2019-12-18 DIAGNOSIS — M79604 Pain in right leg: Secondary | ICD-10-CM | POA: Diagnosis not present

## 2019-12-18 MED ORDER — PREDNISONE 10 MG PO TABS: ORAL_TABLET | ORAL | 0 refills | Status: DC

## 2019-12-18 MED ORDER — TIZANIDINE HCL 4 MG PO TABS: 4.0000 mg | ORAL_TABLET | Freq: Four times a day (QID) | ORAL | 0 refills | Status: DC | PRN

## 2019-12-18 MED ORDER — HYDROCODONE-ACETAMINOPHEN 5-325 MG PO TABS: 1.0000 | ORAL_TABLET | Freq: Four times a day (QID) | ORAL | 0 refills | Status: DC | PRN

## 2019-12-18 NOTE — ED Triage Notes (Signed)
Pt c/o of bilateral leg pain from knee up to hip. Sts right leg is worse than left, on going for 1.5 weeks. Pt sts "it feels like a bone is trying to protrude through my skin, it hurts so bad."   Denies injury. Only therapy attempt is hot/cold and elevation. No advil or tylenol. Pt is ambulatory without assistance.

## 2019-12-18 NOTE — Discharge Instructions (Addendum)
Begin prednisone taper over the next 6 days-begin with 6 tabs/60 mg on day 1, decrease by 1 tablet each day until complete-6, 5, 4, 3, 2, 1-take with food in the morning if you are able You may use tizanidine as needed to help with pain. This is a muscle relaxer and causes sedation- please use only at bedtime or when you will be home and not have to drive/work Hydrocodone for severe pain/nighttime pain.  Use sparingly.  Do not drive or work after taking.  Please return for recheck if symptoms not improving with the above, developing increased calf pain swelling redness or warmth, symptoms changing

## 2019-12-20 NOTE — ED Provider Notes (Signed)
[facility]    CSN: [id number] Arrival date & time: 12/18/19  1616      History   Chief Complaint Chief Complaint  Patient presents with  . Leg Pain    HPI Emily Cochran is a 59 y.o. female history of hypertension, asthma, presenting today for evaluation of right leg pain.  Patient reports that she has had pain in her right calf which radiates up into her thigh hip and low back.  She denies any injury or trauma.  Has felt some slight swelling and cough.  Does report history of sciatica, but feels slightly different.  Reports some associated paresthesias.  Pain on the lateral aspect.  Denies prior DVT/PE.  Denies estrogen use/birth control use.  Denies recent travel or immobilization or surgery.  HPI  Past Medical History:  Diagnosis Date  . Asthma   . Bipolar 1 disorder (HCC)   . Hypertension   . Kidney stones     Patient Active Problem List   Diagnosis Date Noted  . Schizophrenia (HCC) 04/28/2018  . Essential hypertension 04/28/2018    Past Surgical History:  Procedure Laterality Date  . ABDOMINAL HYSTERECTOMY     partial   . APPENDECTOMY    . CHOLECYSTECTOMY      OB History   No obstetric history on file.      Home Medications    Prior to Admission medications   Medication Sig Start Date End Date Taking? Authorizing Provider  amLODipine (NORVASC) 5 MG tablet Take 1 tablet (5 mg total) by mouth daily. 05/29/19 05/28/20 Yes Arnette Felts, FNP  Diphenhydramine-APAP, sleep, (GOODY PM PO) Take 1 Package by mouth daily as needed (pain).    Yes [provider]  fluticasone (FLONASE) 50 MCG/ACT nasal spray Place 2 sprays into both nostrils daily. 08/13/16  Yes Demetrios Loll T, PA-C  metoprolol tartrate (LOPRESSOR) 25 MG tablet TAKE 1 TABLET(25 MG) BY MOUTH TWICE DAILY 11/26/19  Yes Arnette Felts, FNP  OZEMPIC, 0.25 OR 0.5 MG/DOSE, 2 MG/1.5ML SOPN INJECT 0.5MG  INTO THE SKIN ONCE A WEEK 10/31/19  Yes Arnette Felts, FNP  pravastatin (PRAVACHOL)  20 MG tablet Take 1 tablet by mouth MWF 09/03/19  Yes Arnette Felts, FNP  pregabalin (LYRICA) 75 MG capsule Take 1 capsule (75 mg total) by mouth daily. 05/24/19  Yes Arnette Felts, FNP  QUEtiapine (SEROQUEL) 200 MG tablet Take 200 mg by mouth at bedtime.  07/24/16  Yes [provider]  ACCU-CHEK GUIDE test strip CHECK BLOOD SUGAR UP TO 4 TIMES DAILY 04/20/18   [provider]  ACCU-CHEK SOFTCLIX LANCETS lancets CHECK BLOOD SUGAR UP TO 4 TIMES DAILY 07/03/18   Arnette Felts, FNP  albuterol (VENTOLIN HFA) 108 (90 Base) MCG/ACT inhaler INHALE 2 PUFFS INTO THE LUNGS EVERY 6 HOURS AS NEEDED FOR WHEEZING OR SHORTNESS OF BREATH 07/11/19   Arnette Felts, FNP  HYDROcodone-acetaminophen (NORCO/VICODIN) 5-325 MG tablet Take 1 tablet by mouth every 6 (six) hours as needed for severe pain. 12/18/19   Cammeron Greis C, PA-C  predniSONE (DELTASONE) 10 MG tablet Begin with 6 tabs on day 1, 5 tab on day 2, 4 tab on day 3, 3 tab on day 4, 2 tab on day 5, 1 tab on day 6-take with food 12/18/19   Saadiya Wilfong C, PA-C  tiZANidine (ZANAFLEX) 4 MG tablet Take 1 tablet (4 mg total) by mouth every 6 (six) hours as needed for muscle spasms. 12/18/19   Maxx Calaway, Junius Creamer, PA-C    Family History  Family History  Problem Relation Age of Onset  . Bipolar disorder Mother   . Schizophrenia Mother   . Diabetes Mother   . Cancer Mother   . Hypertension Mother   . Cancer Father   . Diabetes Father     Social History Social History   Tobacco Use  . Smoking status: Never Smoker  . Smokeless tobacco: Never Used  Vaping Use  . Vaping Use: Never used  Substance Use Topics  . Alcohol use: No  . Drug use: No     Allergies   Coconut flavor, Shellfish allergy, Ultram [tramadol], and Aspirin   Review of Systems Review of Systems  Constitutional: Negative for fatigue and fever.  Eyes: Negative for visual disturbance.  Respiratory: Negative for shortness of breath.   Cardiovascular: Negative for chest  pain.  Gastrointestinal: Negative for abdominal pain, nausea and vomiting.  Musculoskeletal: Positive for arthralgias, back pain, gait problem and myalgias. Negative for joint swelling.  Skin: Negative for color change, rash and wound.  Neurological: Negative for dizziness, weakness, light-headedness and headaches.     Physical Exam Triage Vital Signs ED Triage Vitals  Enc Vitals Group     BP 12/18/19 1720 (!) 150/92     Pulse Rate 12/18/19 1720 70     Resp 12/18/19 1720 16     Temp 12/18/19 1720 98.1 F (36.7 C)     Temp Source 12/18/19 1720 Oral     SpO2 12/18/19 1720 100 %     Weight --      Height --      Head Circumference --      Peak Flow --      Pain Score 12/18/19 1714 10     Pain Loc --      Pain Edu? --      Excl. in GC? --    No data found.  Updated Vital Signs BP (!) 150/92 (BP Location: Right Arm)   Pulse 70   Temp 98.1 F (36.7 C) (Oral)   Resp 16   SpO2 100%   Visual Acuity Right Eye Distance:   Left Eye Distance:   Bilateral Distance:    Right Eye Near:   Left Eye Near:    Bilateral Near:     Physical Exam Vitals and nursing note reviewed.  Constitutional:      Appearance: She is well-developed.     Comments: No acute distress  HENT:     Head: Normocephalic and atraumatic.     Nose: Nose normal.  Eyes:     Conjunctiva/sclera: Conjunctivae normal.  Cardiovascular:     Rate and Rhythm: Normal rate.  Pulmonary:     Effort: Pulmonary effort is normal. No respiratory distress.  Abdominal:     General: There is no distension.  Musculoskeletal:        General: Normal range of motion.     Cervical back: Neck supple.     Comments:  Nontender to palpation of thoracic and lumbar spine midline, increased tenderness throughout right lumbar and sacral musculature extending laterally hip, lateral thigh knee and calf, no obvious calf swelling noted, no overlying erythema or warmth, negative Homans on the right, no popliteal tenderness Full active  range of motion of the knee  Skin:    General: Skin is warm and dry.  Neurological:     Mental Status: She is alert and oriented to person, place, and time.      UC Treatments / Results  Labs (all labs  ordered are listed, but only abnormal results are displayed) Labs Reviewed - No data to display  EKG   Radiology No results found.  Procedures Procedures (including critical care time)  Medications Ordered in UC Medications - No data to display  Initial Impression / Assessment and Plan / UC Course  I have reviewed the triage vital signs and the nursing notes.  Pertinent labs & imaging results that were available during my care of the patient were reviewed by me and considered in my medical decision making (see chart for details).     Patient with right-sided back and leg pain, atypical in that pain seems to radiate upward versus downward.  No obvious signs of DVT at this time and negative risk factors.  Opting to treat for sciatica/low back pain with radicular distribution with prednisone taper, tizanidine and a few tablets of hydrocodone.  Advised patient to continue to monitor symptoms and lower leg/calf advised to return for follow-up ultrasound if developing any increased swelling redness or warmth to calf.  Discussed strict return precautions. Patient verbalized understanding and is agreeable with plan.  Final Clinical Impressions(s) / UC Diagnoses   Final diagnoses:  Right leg pain     Discharge Instructions     Begin prednisone taper over the next 6 days-begin with 6 tabs/60 mg on day 1, decrease by 1 tablet each day until complete-6, 5, 4, 3, 2, 1-take with food in the morning if you are able You may use tizanidine as needed to help with pain. This is a muscle relaxer and causes sedation- please use only at bedtime or when you will be home and not have to drive/work Hydrocodone for severe pain/nighttime pain.  Use sparingly.  Do not drive or work after  taking.  Please return for recheck if symptoms not improving with the above, developing increased calf pain swelling redness or warmth, symptoms changing    ED Prescriptions    Medication Sig Dispense Auth. Provider   predniSONE (DELTASONE) 10 MG tablet Begin with 6 tabs on day 1, 5 tab on day 2, 4 tab on day 3, 3 tab on day 4, 2 tab on day 5, 1 tab on day 6-take with food 21 tablet Savien Mamula C, PA-C   tiZANidine (ZANAFLEX) 4 MG tablet Take 1 tablet (4 mg total) by mouth every 6 (six) hours as needed for muscle spasms. 30 tablet Kohle Winner C, PA-C   HYDROcodone-acetaminophen (NORCO/VICODIN) 5-325 MG tablet Take 1 tablet by mouth every 6 (six) hours as needed for severe pain. 8 tablet Loui Massenburg, [place] C, PA-C     I have reviewed the PDMP during this encounter.   Lew Dawes, PA-C 12/20/19 1214

## 2020-01-02 ENCOUNTER — Other Ambulatory Visit: Payer: Self-pay | Admitting: Nurse Practitioner

## 2020-01-02 DIAGNOSIS — M797 Fibromyalgia: Secondary | ICD-10-CM

## 2020-01-02 NOTE — Telephone Encounter (Signed)
Lyrica refill

## 2020-01-07 DIAGNOSIS — R197 Diarrhea, unspecified: Secondary | ICD-10-CM | POA: Diagnosis not present

## 2020-01-07 DIAGNOSIS — Z1211 Encounter for screening for malignant neoplasm of colon: Secondary | ICD-10-CM | POA: Diagnosis not present

## 2020-01-07 DIAGNOSIS — Z800 Family history of malignant neoplasm of digestive organs: Secondary | ICD-10-CM | POA: Diagnosis not present

## 2020-01-07 DIAGNOSIS — R1084 Generalized abdominal pain: Secondary | ICD-10-CM | POA: Diagnosis not present

## 2020-01-24 DIAGNOSIS — Z1211 Encounter for screening for malignant neoplasm of colon: Secondary | ICD-10-CM | POA: Diagnosis not present

## 2020-01-24 DIAGNOSIS — R197 Diarrhea, unspecified: Secondary | ICD-10-CM | POA: Diagnosis not present

## 2020-01-24 LAB — HM COLONOSCOPY

## 2020-01-28 ENCOUNTER — Encounter: Payer: Self-pay | Admitting: Nurse Practitioner

## 2020-01-29 ENCOUNTER — Ambulatory Visit (INDEPENDENT_AMBULATORY_CARE_PROVIDER_SITE_OTHER): Payer: Medicare Other | Admitting: Nurse Practitioner

## 2020-01-29 ENCOUNTER — Other Ambulatory Visit: Payer: Self-pay

## 2020-01-29 ENCOUNTER — Encounter: Payer: Self-pay | Admitting: Nurse Practitioner

## 2020-01-29 VITALS — BP 126/80 | HR 77 | Temp 98.3°F | Ht 62.0 in | Wt 209.0 lb

## 2020-01-29 DIAGNOSIS — Z6838 Body mass index (BMI) 38.0-38.9, adult: Secondary | ICD-10-CM

## 2020-01-29 DIAGNOSIS — E119 Type 2 diabetes mellitus without complications: Secondary | ICD-10-CM

## 2020-01-29 DIAGNOSIS — Z0000 Encounter for general adult medical examination without abnormal findings: Secondary | ICD-10-CM

## 2020-01-29 DIAGNOSIS — I10 Essential (primary) hypertension: Secondary | ICD-10-CM

## 2020-01-29 DIAGNOSIS — E559 Vitamin D deficiency, unspecified: Secondary | ICD-10-CM

## 2020-01-29 DIAGNOSIS — Z23 Encounter for immunization: Secondary | ICD-10-CM | POA: Diagnosis not present

## 2020-01-29 DIAGNOSIS — F209 Schizophrenia, unspecified: Secondary | ICD-10-CM

## 2020-01-29 MED ORDER — TETANUS-DIPHTH-ACELL PERTUSSIS 5-2.5-18.5 LF-MCG/0.5 IM SUSP: 0.5000 mL | Freq: Once | INTRAMUSCULAR | 0 refills | Status: AC

## 2020-01-29 NOTE — Progress Notes (Signed)
I,Yamilka Roman [employer] as a Education administrator for [employer], FNP.,have documented all relevant documentation on the behalf of [name], FNP,as directed by  [name], FNP while in the presence of [name], [place]. This visit occurred during the SARS-CoV-2 public health emergency.  Safety protocols were in place, including screening questions prior to the visit, additional usage of staff PPE, and extensive cleaning of exam room while observing appropriate contact time as indicated for disinfecting solutions.  Subjective:     Patient ID: Emily Cochran , female    DOB: 10-Jul-1960 , 59 y.o.   MRN: [id number]   Chief Complaint  Patient presents with  . Annual Exam    HPI  Here for HM, she has had her Colonoscopy on 8/19 and had samples done.     Wt Readings from Last 3 Encounters: 01/29/20 : 209 lb (94.8 kg) 05/29/19 : 212 lb (96.2 kg) 05/24/19 : 212 lb 12.8 oz (96.5 kg)   Diabetes She presents for her follow-up diabetic visit. She has type 2 diabetes mellitus. Her disease course has been stable. There are no hypoglycemic associated symptoms. There are no diabetic associated symptoms. There are no hypoglycemic complications. Symptoms are stable. There are no diabetic complications. Current diabetic treatment includes oral agent (monotherapy). She participates in exercise daily. Her home blood glucose trend is decreasing steadily. (Blood sugar 116-124)     Past Medical History:  Diagnosis Date  . Asthma   . Bipolar 1 disorder ([place])   . Hypertension   . Kidney stones   . Type 2 diabetes mellitus without complication, without long-term current use of insulin ([place]) 02/29/2020     Family History  Problem Relation Age of Onset  . Bipolar disorder Mother   . Schizophrenia Mother   . Diabetes Mother   . Cancer Mother   . Hypertension Mother   . Cancer Father   . Diabetes Father      Current Outpatient Medications:  .  ACCU-CHEK GUIDE test strip, CHECK BLOOD SUGAR UP TO 4  TIMES DAILY, Disp: , Rfl: 0 .  ACCU-CHEK SOFTCLIX LANCETS lancets, CHECK BLOOD SUGAR UP TO 4 TIMES DAILY, Disp: 300 each, Rfl: 1 .  albuterol (VENTOLIN HFA) 108 (90 Base) MCG/ACT inhaler, INHALE 2 PUFFS INTO THE LUNGS EVERY 6 HOURS AS NEEDED FOR WHEEZING OR SHORTNESS OF BREATH, Disp: 54 g, Rfl: 1 .  Diphenhydramine-APAP, sleep, (GOODY PM PO), Take 1 Package by mouth daily as needed (pain). , Disp: , Rfl:  .  fluticasone (FLONASE) 50 MCG/ACT nasal spray, Place 2 sprays into both nostrils daily., Disp: 16 g, Rfl: 12 .  metoprolol tartrate (LOPRESSOR) 25 MG tablet, TAKE 1 TABLET(25 MG) BY MOUTH TWICE DAILY, Disp: 180 tablet, Rfl: 1 .  OZEMPIC, 0.25 OR 0.5 MG/DOSE, 2 MG/1.5ML SOPN, INJECT 0.5MG INTO THE SKIN ONCE A WEEK, Disp: 4.5 mL, Rfl: 1 .  pravastatin (PRAVACHOL) 20 MG tablet, Take 1 tablet by mouth MWF, Disp: 45 tablet, Rfl: 1 .  pregabalin (LYRICA) 75 MG capsule, TAKE 1 CAPSULE(75 MG) BY MOUTH DAILY, Disp: 30 capsule, Rfl: 5 .  QUEtiapine (SEROQUEL) 200 MG tablet, Take 200 mg by mouth at bedtime. , Disp: , Rfl: 0 .  amLODipine (NORVASC) 5 MG tablet, TAKE 1 TABLET(5 MG) BY MOUTH DAILY, Disp: 90 tablet, Rfl: 1 .  HYDROcodone-acetaminophen (NORCO/VICODIN) 5-325 MG tablet, Take 1 tablet by mouth every 6 (six) hours as needed for severe pain. (Patient not taking: Reported on 01/29/2020), Disp: 8 tablet, Rfl: 0 .  tiZANidine (ZANAFLEX) 4 MG tablet, Take 1 tablet (4 mg total) by mouth every 6 (six) hours as needed for muscle spasms. (Patient not taking: Reported on 01/29/2020), Disp: 30 tablet, Rfl: 0   Allergies  Allergen Reactions  . Coconut Flavor Shortness Of Breath and Swelling  . Shellfish Allergy Shortness Of Breath and Swelling    She can eat shrimp  . Ultram [Tramadol] Anaphylaxis    Pt REPORTS THROAT SWELLING  . Aspirin Swelling and Other (See Comments)    Angioedema, cuts on lips      The patient states she is status post hysterectomy Negative for Dysmenorrhea and Negative for  Menorrhagia. Negative for: breast discharge, breast lump(s), breast pain and breast self exam. Associated symptoms include abnormal vaginal bleeding. Pertinent negatives include abnormal bleeding (hematology), anxiety, decreased libido, depression, difficulty falling sleep, dyspareunia, history of infertility, nocturia, sexual dysfunction, sleep disturbances, urinary incontinence, urinary urgency, vaginal discharge and vaginal itching. Diet regular. The patient states her exercise level is moderate with walking.    The patient's tobacco use is:  Social History   Tobacco Use  Smoking Status Never Smoker  Smokeless Tobacco Never Used   She has been exposed to passive smoke. The patient's alcohol use is:  Social History   Substance and Sexual Activity  Alcohol Use No      Review of Systems  Constitutional: Negative.   HENT: Negative.   Eyes: Negative.   Respiratory: Negative.   Cardiovascular: Negative.   Gastrointestinal: Negative.   Endocrine: Negative.   Genitourinary: Negative.   Musculoskeletal: Negative.   Skin: Negative.   Allergic/Immunologic: Negative.   Neurological: Negative.   Hematological: Negative.   Psychiatric/Behavioral: Negative.      Today's Vitals   01/29/20 1415  BP: 126/80  Pulse: 77  Temp: 98.3 F (36.8 C)  TempSrc: Oral  Weight: 209 lb (94.8 kg)  Height: '5\' 2"'  (1.575 m)  PainSc: 0-No pain   Body mass index is 38.23 kg/m.   Objective:  Physical Exam Constitutional:      General: She is not in acute distress.    Appearance: Normal appearance. She is well-developed. She is obese.  HENT:     Head: Normocephalic and atraumatic.     Right Ear: Hearing, tympanic membrane, ear canal and external ear normal. There is no impacted cerumen.     Left Ear: Hearing, tympanic membrane, ear canal and external ear normal. There is no impacted cerumen.     Nose:     Comments: Deferred - masked    Mouth/Throat:     Comments: Deferred - masked  Eyes:      General: Lids are normal.     Extraocular Movements: Extraocular movements intact.     Conjunctiva/sclera: Conjunctivae normal.     Pupils: Pupils are equal, round, and reactive to light.     Funduscopic exam:    Right eye: No papilledema.        Left eye: No papilledema.  Neck:     Thyroid: No thyroid mass.     Vascular: No carotid bruit.  Cardiovascular:     Rate and Rhythm: Normal rate and regular rhythm.     Pulses: Normal pulses.     Heart sounds: Normal heart sounds. No murmur heard.   Pulmonary:     Effort: Pulmonary effort is normal.     Breath sounds: Normal breath sounds.  Abdominal:     General: Abdomen is flat. Bowel sounds are normal. There is no distension.  Palpations: Abdomen is soft.     Tenderness: There is no abdominal tenderness.  Musculoskeletal:        General: No swelling. Normal range of motion.     Cervical back: Full passive range of motion without pain, normal range of motion and neck supple.     Right lower leg: No edema.     Left lower leg: No edema.  Skin:    General: Skin is warm and dry.     Capillary Refill: Capillary refill takes less than 2 seconds.  Neurological:     General: No focal deficit present.     Mental Status: She is alert and oriented to person, place, and time.     Cranial Nerves: No cranial nerve deficit.     Sensory: No sensory deficit.  Psychiatric:        Mood and Affect: Mood normal.        Behavior: Behavior normal.        Thought Content: Thought content normal.        Judgment: Judgment normal.         Assessment And Plan:     1. Encounter for general adult medical examination w/o abnormal findings . Behavior modifications discussed and diet history reviewed.   . Pt will continue to exercise regularly and modify diet with low GI, plant based foods and decrease intake of processed foods.  . Recommend intake of daily multivitamin, Vitamin D, and calcium.  . Recommend mammogram and colonoscopy for preventive  screenings, as well as recommend immunizations that include influenza, TDAP  .  2. Encounter for immunization  Pneumonia received in office and sent tdap to pharmacy - Pneumococcal polysaccharide vaccine 23-valent greater than or equal to 2yo subcutaneous/IM - Tdap (BOOSTRIX) 5-2.5-18.5 LF-MCG/0.5 injection; Inject 0.5 mLs into the muscle once for 1 dose.  Dispense: 0.5 mL; Refill: 0  3. Essential hypertension . B/P is well controlled.  . CMP ordered to check renal function.  . The importance of regular exercise and dietary modification was stressed to the patient.  . Stressed importance of losing ten percent of her body weight to help with B/P control.  . The weight loss would help with decreasing cardiac and cancer risk as well.  . EKG done with NSR 82 - EKG 12-Lead - CMP14+EGFR  4. Type 2 diabetes mellitus without complication, without long-term current use of insulin (HCC)  Chronic, she is tolerating her medications well  Will check HgbA1c  Encouraged to exercise 150 minutes per week as tolerated. - Hemoglobin A1c - CBC  5. Class 2 severe obesity due to excess calories with serious comorbidity and body mass index (BMI) of 38.0 to 38.9 in adult [facility])  Chronic  Discussed healthy diet and regular exercise options   Encouraged to exercise at least 150 minutes per week with 2 days of strength training  6. Vitamin D deficiency  Will check vitamin D level and supplement as needed.     Also encouraged to spend 15 minutes in the sun daily.  - VITAMIN D 25 Hydroxy (Vit-D Deficiency, Fractures)  7. Schizophrenia, unspecified type ([place])  Chronic, she is not seeing a specialist at this time, offered to refer but declined   No specialist, when she gets severely depressed.    Patient was given opportunity to ask questions. Patient verbalized understanding of the plan and was able to repeat key elements of the plan. All questions were answered to their satisfaction.    I,  [name],  FNP, have reviewed all documentation for this visit. The documentation on 02/29/20 for the exam, diagnosis, procedures, and orders are all accurate and complete.  THE PATIENT IS ENCOURAGED TO PRACTICE SOCIAL DISTANCING DUE TO THE COVID-19 PANDEMIC.

## 2020-01-29 NOTE — Patient Instructions (Signed)
Health Maintenance, Female Adopting a healthy lifestyle and getting preventive care are important in promoting health and wellness. Ask your health care provider about:  The right schedule for you to have regular tests and exams.  Things you can do on your own to prevent diseases and keep yourself healthy. What should I know about diet, weight, and exercise? Eat a healthy diet   Eat a diet that includes plenty of vegetables, fruits, low-fat dairy products, and lean protein.  Do not eat a lot of foods that are high in solid fats, added sugars, or sodium. Maintain a healthy weight Body mass index (BMI) is used to identify weight problems. It estimates body fat based on height and weight. Your health care provider can help determine your BMI and help you achieve or maintain a healthy weight. Get regular exercise Get regular exercise. This is one of the most important things you can do for your health. Most adults should:  Exercise for at least 150 minutes each week. The exercise should increase your heart rate and make you sweat (moderate-intensity exercise).  Do strengthening exercises at least twice a week. This is in addition to the moderate-intensity exercise.  Spend less time sitting. Even light physical activity can be beneficial. Watch cholesterol and blood lipids Have your blood tested for lipids and cholesterol at 59 years of age, then have this test every 5 years. Have your cholesterol levels checked more often if:  Your lipid or cholesterol levels are high.  You are older than 59 years of age.  You are at high risk for heart disease. What should I know about cancer screening? Depending on your health history and family history, you may need to have cancer screening at various ages. This may include screening for:  Breast cancer.  Cervical cancer.  Colorectal cancer.  Skin cancer.  Lung cancer. What should I know about heart disease, diabetes, and high blood  pressure? Blood pressure and heart disease  High blood pressure causes heart disease and increases the risk of stroke. This is more likely to develop in people who have high blood pressure readings, are of African descent, or are overweight.  Have your blood pressure checked: ? Every 3-5 years if you are 18-39 years of age. ? Every year if you are 40 years old or older. Diabetes Have regular diabetes screenings. This checks your fasting blood sugar level. Have the screening done:  Once every three years after age 40 if you are at a normal weight and have a low risk for diabetes.  More often and at a younger age if you are overweight or have a high risk for diabetes. What should I know about preventing infection? Hepatitis B If you have a higher risk for hepatitis B, you should be screened for this virus. Talk with your health care provider to find out if you are at risk for hepatitis B infection. Hepatitis C Testing is recommended for:  Everyone born from 1945 through 1965.  Anyone with known risk factors for hepatitis C. Sexually transmitted infections (STIs)  Get screened for STIs, including gonorrhea and chlamydia, if: ? You are sexually active and are younger than 59 years of age. ? You are older than 59 years of age and your health care provider tells you that you are at risk for this type of infection. ? Your sexual activity has changed since you were last screened, and you are at increased risk for chlamydia or gonorrhea. Ask your health care provider if   you are at risk.  Ask your health care provider about whether you are at high risk for HIV. Your health care provider may recommend a prescription medicine to help prevent HIV infection. If you choose to take medicine to prevent HIV, you should first get tested for HIV. You should then be tested every 3 months for as long as you are taking the medicine. Pregnancy  If you are about to stop having your period (premenopausal) and  you may become pregnant, seek counseling before you get pregnant.  Take 400 to 800 micrograms (mcg) of folic acid every day if you become pregnant.  Ask for birth control (contraception) if you want to prevent pregnancy. Osteoporosis and menopause Osteoporosis is a disease in which the bones lose minerals and strength with aging. This can result in bone fractures. If you are 65 years old or older, or if you are at risk for osteoporosis and fractures, ask your health care provider if you should:  Be screened for bone loss.  Take a calcium or vitamin D supplement to lower your risk of fractures.  Be given hormone replacement therapy (HRT) to treat symptoms of menopause. Follow these instructions at home: Lifestyle  Do not use any products that contain nicotine or tobacco, such as cigarettes, e-cigarettes, and chewing tobacco. If you need help quitting, ask your health care provider.  Do not use street drugs.  Do not share needles.  Ask your health care provider for help if you need support or information about quitting drugs. Alcohol use  Do not drink alcohol if: ? Your health care provider tells you not to drink. ? You are pregnant, may be pregnant, or are planning to become pregnant.  If you drink alcohol: ? Limit how much you use to 0-1 drink a day. ? Limit intake if you are breastfeeding.  Be aware of how much alcohol is in your drink. In the [place] one drink equals one 12 oz bottle of beer (355 mL), one 5 oz glass of wine (148 mL), or one 1 oz glass of hard liquor (44 mL). General instructions  Schedule regular health, dental, and eye exams.  Stay current with your vaccines.  Tell your health care provider if: ? You often feel depressed. ? You have ever been abused or do not feel safe at home. Summary  Adopting a healthy lifestyle and getting preventive care are important in promoting health and wellness.  Follow your health care provider's instructions about healthy  diet, exercising, and getting tested or screened for diseases.  Follow your health care provider's instructions on monitoring your cholesterol and blood pressure. This information is not intended to replace advice given to you by your health care provider. Make sure you discuss any questions you have with your health care provider. Document Revised: 05/17/2018 Document Reviewed: 05/17/2018 Elsevier Patient Education  2020 Elsevier Inc.  

## 2020-01-30 LAB — CBC
Hematocrit: 38.7 % (ref 34.0–46.6)
Hemoglobin: 11.9 g/dL (ref 11.1–15.9)
MCH: 23.8 pg — ABNORMAL LOW (ref 26.6–33.0)
MCHC: 30.7 g/dL — ABNORMAL LOW (ref 31.5–35.7)
MCV: 77 fL — ABNORMAL LOW (ref 79–97)
Platelets: 342 10*3/uL (ref 150–450)
RBC: 5 x10E6/uL (ref 3.77–5.28)
RDW: 14.1 % (ref 11.7–15.4)
WBC: 10.2 10*3/uL (ref 3.4–10.8)

## 2020-01-30 LAB — CMP14+EGFR
ALT: 13 IU/L (ref 0–32)
AST: 16 IU/L (ref 0–40)
Albumin/Globulin Ratio: 1.6 (ref 1.2–2.2)
Albumin: 3.9 g/dL (ref 3.8–4.9)
Alkaline Phosphatase: 91 IU/L (ref 48–121)
BUN/Creatinine Ratio: 14 (ref 9–23)
BUN: 10 mg/dL (ref 6–24)
Bilirubin Total: 0.2 mg/dL (ref 0.0–1.2)
CO2: 27 mmol/L (ref 20–29)
Calcium: 9.3 mg/dL (ref 8.7–10.2)
Chloride: 101 mmol/L (ref 96–106)
Creatinine, Ser: 0.72 mg/dL (ref 0.57–1.00)
GFR calc Af Amer: 107 mL/min/{1.73_m2} (ref >59)
GFR calc non Af Amer: 93 mL/min/{1.73_m2} (ref >59)
Globulin, Total: 2.5 g/dL (ref 1.5–4.5)
Glucose: 93 mg/dL (ref 65–99)
Potassium: 3.7 mmol/L (ref 3.5–5.2)
Sodium: 141 mmol/L (ref 134–144)
Total Protein: 6.4 g/dL (ref 6.0–8.5)

## 2020-01-30 LAB — HEMOGLOBIN A1C
Est. average glucose Bld gHb Est-mCnc: 146 mg/dL
Hgb A1c MFr Bld: 6.7 % — ABNORMAL HIGH (ref 4.8–5.6)

## 2020-01-30 LAB — VITAMIN D 25 HYDROXY (VIT D DEFICIENCY, FRACTURES): Vit D, 25-Hydroxy: 68.7 ng/mL (ref 30.0–100.0)

## 2020-02-04 ENCOUNTER — Other Ambulatory Visit: Payer: Self-pay | Admitting: Nurse Practitioner

## 2020-02-04 DIAGNOSIS — I10 Essential (primary) hypertension: Secondary | ICD-10-CM

## 2020-02-20 DIAGNOSIS — R197 Diarrhea, unspecified: Secondary | ICD-10-CM | POA: Diagnosis not present

## 2020-02-20 DIAGNOSIS — R1084 Generalized abdominal pain: Secondary | ICD-10-CM | POA: Diagnosis not present

## 2020-02-20 DIAGNOSIS — R634 Abnormal weight loss: Secondary | ICD-10-CM | POA: Diagnosis not present

## 2020-02-29 ENCOUNTER — Encounter: Payer: Self-pay | Admitting: Nurse Practitioner

## 2020-02-29 DIAGNOSIS — E119 Type 2 diabetes mellitus without complications: Secondary | ICD-10-CM | POA: Insufficient documentation

## 2020-02-29 DIAGNOSIS — Z6838 Body mass index (BMI) 38.0-38.9, adult: Secondary | ICD-10-CM | POA: Insufficient documentation

## 2020-02-29 DIAGNOSIS — E559 Vitamin D deficiency, unspecified: Secondary | ICD-10-CM | POA: Insufficient documentation

## 2020-02-29 HISTORY — DX: Type 2 diabetes mellitus without complications: E11.9

## 2020-04-21 ENCOUNTER — Other Ambulatory Visit: Payer: Self-pay | Admitting: Nurse Practitioner

## 2020-04-21 DIAGNOSIS — E119 Type 2 diabetes mellitus without complications: Secondary | ICD-10-CM

## 2020-05-28 ENCOUNTER — Ambulatory Visit: Payer: 59 | Admitting: Nurse Practitioner

## 2020-05-28 ENCOUNTER — Telehealth: Payer: Self-pay

## 2020-05-28 NOTE — Telephone Encounter (Signed)
This nurse attempted to call patient in regard to missed appointments. Message left for her to call back in order to reschedule for another time.

## 2020-06-03 ENCOUNTER — Ambulatory Visit: Payer: Medicare Other

## 2020-06-03 ENCOUNTER — Ambulatory Visit: Payer: Medicare Other | Admitting: Nurse Practitioner

## 2020-06-15 ENCOUNTER — Other Ambulatory Visit: Payer: Self-pay | Admitting: Nurse Practitioner

## 2020-06-15 DIAGNOSIS — E119 Type 2 diabetes mellitus without complications: Secondary | ICD-10-CM

## 2020-06-16 ENCOUNTER — Telehealth: Payer: Self-pay

## 2020-06-16 NOTE — Telephone Encounter (Signed)
Called to schedule pt an appt. Pt declined appt stating that she does not have a car right now. Explained to patient that we havent seen her since august and we cant continue to send medication without checking to see how she is doing.

## 2020-07-16 ENCOUNTER — Ambulatory Visit: Payer: 59

## 2020-07-19 ENCOUNTER — Other Ambulatory Visit: Payer: Self-pay | Admitting: Internal Medicine

## 2020-07-19 DIAGNOSIS — I10 Essential (primary) hypertension: Secondary | ICD-10-CM

## 2020-07-25 ENCOUNTER — Other Ambulatory Visit: Payer: Self-pay | Admitting: Nurse Practitioner

## 2020-07-25 DIAGNOSIS — I10 Essential (primary) hypertension: Secondary | ICD-10-CM

## 2020-07-28 ENCOUNTER — Other Ambulatory Visit: Payer: Self-pay

## 2020-07-28 ENCOUNTER — Encounter: Payer: Self-pay | Admitting: Nurse Practitioner

## 2020-07-28 ENCOUNTER — Ambulatory Visit (INDEPENDENT_AMBULATORY_CARE_PROVIDER_SITE_OTHER): Payer: 59 | Admitting: Nurse Practitioner

## 2020-07-28 VITALS — BP 124/82 | Temp 98.1°F | Ht 61.6 in | Wt 207.8 lb

## 2020-07-28 DIAGNOSIS — E119 Type 2 diabetes mellitus without complications: Secondary | ICD-10-CM

## 2020-07-28 DIAGNOSIS — Z23 Encounter for immunization: Secondary | ICD-10-CM

## 2020-07-28 DIAGNOSIS — R946 Abnormal results of thyroid function studies: Secondary | ICD-10-CM | POA: Diagnosis not present

## 2020-07-28 DIAGNOSIS — F209 Schizophrenia, unspecified: Secondary | ICD-10-CM

## 2020-07-28 DIAGNOSIS — Z6838 Body mass index (BMI) 38.0-38.9, adult: Secondary | ICD-10-CM

## 2020-07-28 DIAGNOSIS — G8929 Other chronic pain: Secondary | ICD-10-CM

## 2020-07-28 DIAGNOSIS — M797 Fibromyalgia: Secondary | ICD-10-CM | POA: Diagnosis not present

## 2020-07-28 DIAGNOSIS — E559 Vitamin D deficiency, unspecified: Secondary | ICD-10-CM

## 2020-07-28 DIAGNOSIS — I10 Essential (primary) hypertension: Secondary | ICD-10-CM | POA: Diagnosis not present

## 2020-07-28 DIAGNOSIS — M25561 Pain in right knee: Secondary | ICD-10-CM

## 2020-07-28 MED ORDER — PREGABALIN 75 MG PO CAPS: 75.0000 mg | ORAL_CAPSULE | Freq: Every day | ORAL | 5 refills | Status: DC

## 2020-07-28 MED ORDER — METOPROLOL TARTRATE 25 MG PO TABS: 25.0000 mg | ORAL_TABLET | Freq: Two times a day (BID) | ORAL | 1 refills | Status: DC

## 2020-07-28 MED ORDER — PRAVASTATIN SODIUM 20 MG PO TABS: ORAL_TABLET | ORAL | 1 refills | Status: DC

## 2020-07-28 MED ORDER — OZEMPIC (0.25 OR 0.5 MG/DOSE) 2 MG/1.5ML ~~LOC~~ SOPN: 0.5000 mg | PEN_INJECTOR | SUBCUTANEOUS | 1 refills | Status: DC

## 2020-07-28 MED ORDER — AMLODIPINE BESYLATE 5 MG PO TABS: 5.0000 mg | ORAL_TABLET | Freq: Every day | ORAL | 1 refills | Status: DC

## 2020-07-28 NOTE — Patient Instructions (Signed)

## 2020-07-28 NOTE — Progress Notes (Signed)
I,Yamilka Roman [employer] as a Education administrator for [employer], FNP.,have documented all relevant documentation on the behalf of [name], FNP,as directed by  [name], FNP while in the presence of [name], [place]. This visit occurred during the SARS-CoV-2 public health emergency.  Safety protocols were in place, including screening questions prior to the visit, additional usage of staff PPE, and extensive cleaning of exam room while observing appropriate contact time as indicated for disinfecting solutions.  Subjective:     Patient ID: Emily Cochran , female    DOB: June 18, 1960 , 60 y.o.   MRN: [id number]   Chief Complaint  Patient presents with  . Hypertension  . Diabetes    HPI  Patient presents today for a blood pressure and dm f/u   Wt Readings from Last 3 Encounters: 07/28/20 : 207 lb 12.8 oz (94.3 kg) 01/29/20 : 209 lb (94.8 kg) 05/29/19 : 212 lb (96.2 kg)  She is now living in [place] due to the cost of living here in [place]  Hypertension This is a chronic problem. The current episode started more than 1 year ago. The problem is controlled. Associated symptoms include headaches. Pertinent negatives include no anxiety, chest pain or palpitations. Risk factors for coronary artery disease include obesity, sedentary lifestyle and diabetes mellitus.  Diabetes She presents for her follow-up diabetic visit. She has type 2 diabetes mellitus. Hypoglycemia symptoms include headaches. Pertinent negatives for hypoglycemia include no dizziness. Pertinent negatives for diabetes include no chest pain. Current diabetic treatment includes oral agent (monotherapy). (She reports she is checking her blood sugar but does not know the range, states "it is within normal range") Eye exam current: she is going to call her eye doctor today.     Past Medical History:  Diagnosis Date  . Asthma   . Bipolar 1 disorder ([place])   . Hypertension   . Kidney stones   . Type 2 diabetes mellitus  without complication, without long-term current use of insulin ([place]) 02/29/2020     Family History  Problem Relation Age of Onset  . Bipolar disorder Mother   . Schizophrenia Mother   . Diabetes Mother   . Cancer Mother   . Hypertension Mother   . Cancer Father   . Diabetes Father      Current Outpatient Medications:  .  ACCU-CHEK GUIDE test strip, CHECK BLOOD SUGAR UP TO 4 TIMES DAILY, Disp: , Rfl: 0 .  ACCU-CHEK SOFTCLIX LANCETS lancets, CHECK BLOOD SUGAR UP TO 4 TIMES DAILY, Disp: 300 each, Rfl: 1 .  albuterol (VENTOLIN HFA) 108 (90 Base) MCG/ACT inhaler, INHALE 2 PUFFS INTO THE LUNGS EVERY 6 HOURS AS NEEDED FOR WHEEZING OR SHORTNESS OF BREATH, Disp: 54 g, Rfl: 1 .  Diphenhydramine-APAP, sleep, (GOODY PM PO), Take 1 Package by mouth daily as needed (pain). , Disp: , Rfl:  .  fluticasone (FLONASE) 50 MCG/ACT nasal spray, Place 2 sprays into both nostrils daily., Disp: 16 g, Rfl: 12 .  QUEtiapine (SEROQUEL) 200 MG tablet, Take 200 mg by mouth at bedtime. , Disp: , Rfl: 0 .  amLODipine (NORVASC) 5 MG tablet, Take 1 tablet (5 mg total) by mouth daily., Disp: 90 tablet, Rfl: 1 .  HYDROcodone-acetaminophen (NORCO/VICODIN) 5-325 MG tablet, Take 1 tablet by mouth every 6 (six) hours as needed for severe pain. (Patient not taking: No sig reported), Disp: 8 tablet, Rfl: 0 .  metoprolol tartrate (LOPRESSOR) 25 MG tablet, Take 1 tablet (25 mg total) by mouth 2 (two) times daily.,  Disp: 180 tablet, Rfl: 1 .  pravastatin (PRAVACHOL) 20 MG tablet, TAKE 1 TABLET BY MOUTH MONDAY WEDNESDAY AND FRIDAY, Disp: 45 tablet, Rfl: 1 .  pregabalin (LYRICA) 75 MG capsule, Take 1 capsule (75 mg total) by mouth daily., Disp: 30 capsule, Rfl: 5 .  Semaglutide,0.25 or 0.5MG/DOS, (OZEMPIC, 0.25 OR 0.5 MG/DOSE,) 2 MG/1.5ML SOPN, Inject 0.5 mg into the skin once a week., Disp: 4.5 mL, Rfl: 1 .  tiZANidine (ZANAFLEX) 4 MG tablet, Take 1 tablet (4 mg total) by mouth every 6 (six) hours as needed for muscle spasms. (Patient  not taking: No sig reported), Disp: 30 tablet, Rfl: 0   Allergies  Allergen Reactions  . Coconut Flavor Shortness Of Breath and Swelling  . Shellfish Allergy Shortness Of Breath and Swelling    She can eat shrimp  . Ultram [Tramadol] Anaphylaxis    Pt REPORTS THROAT SWELLING  . Aspirin Swelling and Other (See Comments)    Angioedema, cuts on lips     Review of Systems  Constitutional: Negative.   HENT: Negative.   Eyes: Negative.   Respiratory: Negative.   Cardiovascular: Negative.  Negative for chest pain, palpitations and leg swelling.  Gastrointestinal: Negative.   Endocrine: Negative.   Genitourinary: Negative.   Musculoskeletal: Negative.   Skin: Negative.   Neurological: Positive for headaches. Negative for dizziness.  Hematological: Negative.   Psychiatric/Behavioral: Negative.      Today's Vitals   07/28/20 1203  BP: 124/82  Temp: 98.1 F (36.7 C)  TempSrc: Oral  Weight: 207 lb 12.8 oz (94.3 kg)  Height: 5' 1.6" (1.565 m)  PainSc: 10-Worst pain ever  PainLoc: Back   Body mass index is 38.5 kg/m.   Objective:  Physical Exam Constitutional:      General: She is not in acute distress.    Appearance: Normal appearance. She is obese.  Cardiovascular:     Rate and Rhythm: Normal rate and regular rhythm.     Pulses: Normal pulses.     Heart sounds: Normal heart sounds. No murmur heard.   Pulmonary:     Effort: Pulmonary effort is normal. No respiratory distress.     Breath sounds: Normal breath sounds. No wheezing.  Abdominal:     General: Abdomen is flat. Bowel sounds are normal.     Palpations: Abdomen is soft.  Musculoskeletal:        General: Normal range of motion.     Cervical back: Normal range of motion and neck supple.  Skin:    General: Skin is warm.     Capillary Refill: Capillary refill takes less than 2 seconds.  Neurological:     General: No focal deficit present.     Mental Status: She is alert and oriented to person, place, and  time.  Psychiatric:        Mood and Affect: Mood normal.        Behavior: Behavior normal.        Thought Content: Thought content normal.        Judgment: Judgment normal.         Assessment And Plan:     1. Essential hypertension  Chronic, blood pressure is well controlled  Continue with current medications - amLODipine (NORVASC) 5 MG tablet; Take 1 tablet (5 mg total) by mouth daily.  Dispense: 90 tablet; Refill: 1 - CMP14+EGFR  2. Abnormal thyroid function test - metoprolol tartrate (LOPRESSOR) 25 MG tablet; Take 1 tablet (25 mg total) by mouth 2 (two) times  daily.  Dispense: 180 tablet; Refill: 1  3. Type 2 diabetes mellitus without complication, without long-term current use of insulin (HCC)  Chronic, controlled  Continue with current medications  Encouraged to limit intake of sugary foods and drinks  Encouraged to increase physical activity to 150 minutes per week  She is unable to get a urine sample today  She is encouraged to follow up with her opthalmologist - Semaglutide,0.25 or 0.5MG/DOS, (OZEMPIC, 0.25 OR 0.5 MG/DOSE,) 2 MG/1.5ML SOPN; Inject 0.5 mg into the skin once a week.  Dispense: 4.5 mL; Refill: 1 - pravastatin (PRAVACHOL) 20 MG tablet; TAKE 1 TABLET BY MOUTH MONDAY WEDNESDAY AND FRIDAY  Dispense: 45 tablet; Refill: 1 - Lipid panel - Hemoglobin A1c  4. Fibromyalgia  Chronic, stable on lyrica - pregabalin (LYRICA) 75 MG capsule; Take 1 capsule (75 mg total) by mouth daily.  Dispense: 30 capsule; Refill: 5  5. Class 2 severe obesity due to excess calories with serious comorbidity and body mass index (BMI) of 38.0 to 38.9 in adult [facility])  Chronic  Discussed healthy diet and regular exercise options   Encouraged to exercise at least 150 minutes per week with 2 days of strength training as tolerated  6. Vitamin D deficiency  Will check vitamin D level and supplement as needed.     Also encouraged to spend 15 minutes in the sun daily.  - Vitamin  D (25 hydroxy)  7. Chronic pain of right knee  She has had persistent right knee pain  Pain cream has been ineffective and this limits her from doing physical activity - Ambulatory referral to Orthopedic Surgery  8. Schizophrenia, unspecified type ([place])  Chronic she is stable and does not follow up with a psychiatrist  9. Need for influenza vaccination  Influenza vaccine administered  Encouraged to take Tylenol as needed for fever or muscle aches. - Flu Vaccine QUAD 6+ mos PF IM (Fluarix Quad PF)     Patient was given opportunity to ask questions. Patient verbalized understanding of the plan and was able to repeat key elements of the plan. All questions were answered to their satisfaction.  [name], FNP   I, [name], FNP, have reviewed all documentation for this visit. The documentation on 07/28/20 for the exam, diagnosis, procedures, and orders are all accurate and complete.   THE PATIENT IS ENCOURAGED TO PRACTICE SOCIAL DISTANCING DUE TO THE COVID-19 PANDEMIC.

## 2020-07-29 LAB — CMP14+EGFR
ALT: 12 IU/L (ref 0–32)
AST: 16 IU/L (ref 0–40)
Albumin/Globulin Ratio: 1.6 (ref 1.2–2.2)
Albumin: 4.4 g/dL (ref 3.8–4.9)
Alkaline Phosphatase: 58 IU/L (ref 44–121)
BUN/Creatinine Ratio: 7 — ABNORMAL LOW (ref 9–23)
BUN: 5 mg/dL — ABNORMAL LOW (ref 6–24)
Bilirubin Total: 0.2 mg/dL (ref 0.0–1.2)
CO2: 21 mmol/L (ref 20–29)
Calcium: 9.2 mg/dL (ref 8.7–10.2)
Chloride: 102 mmol/L (ref 96–106)
Creatinine, Ser: 0.73 mg/dL (ref 0.57–1.00)
GFR calc Af Amer: 104 mL/min/{1.73_m2} (ref >59)
GFR calc non Af Amer: 90 mL/min/{1.73_m2} (ref >59)
Globulin, Total: 2.7 g/dL (ref 1.5–4.5)
Glucose: 87 mg/dL (ref 65–99)
Potassium: 4.5 mmol/L (ref 3.5–5.2)
Sodium: 137 mmol/L (ref 134–144)
Total Protein: 7.1 g/dL (ref 6.0–8.5)

## 2020-07-29 LAB — LIPID PANEL
Chol/HDL Ratio: 2.2 ratio (ref 0.0–4.4)
Cholesterol, Total: 104 mg/dL (ref 100–199)
HDL: 48 mg/dL (ref >39)
LDL Chol Calc (NIH): 46 mg/dL (ref 0–99)
Triglycerides: 36 mg/dL (ref 0–149)
VLDL Cholesterol Cal: 10 mg/dL (ref 5–40)

## 2020-07-29 LAB — VITAMIN D 25 HYDROXY (VIT D DEFICIENCY, FRACTURES): Vit D, 25-Hydroxy: 62.9 ng/mL (ref 30.0–100.0)

## 2020-07-29 LAB — HEMOGLOBIN A1C
Est. average glucose Bld gHb Est-mCnc: 105 mg/dL
Hgb A1c MFr Bld: 5.3 % (ref 4.8–5.6)

## 2020-08-18 ENCOUNTER — Ambulatory Visit: Payer: Self-pay

## 2020-08-18 ENCOUNTER — Ambulatory Visit (INDEPENDENT_AMBULATORY_CARE_PROVIDER_SITE_OTHER): Payer: 59 | Admitting: Orthopedic Surgery

## 2020-08-18 ENCOUNTER — Encounter: Payer: Self-pay | Admitting: Orthopedic Surgery

## 2020-08-18 ENCOUNTER — Other Ambulatory Visit: Payer: Self-pay

## 2020-08-18 VITALS — Ht 62.0 in | Wt 205.0 lb

## 2020-08-18 DIAGNOSIS — M25561 Pain in right knee: Secondary | ICD-10-CM | POA: Diagnosis not present

## 2020-08-18 DIAGNOSIS — M25562 Pain in left knee: Secondary | ICD-10-CM

## 2020-08-18 DIAGNOSIS — G8929 Other chronic pain: Secondary | ICD-10-CM

## 2020-08-18 NOTE — Progress Notes (Signed)
Office Visit Note   Patient: Emily Cochran           Date of Birth: 10-10-1960           MRN: [id number] Visit Date: 08/18/2020              Requested by: Arnette Felts, FNP [address] [place],  [place] [address] PCP: Arnette Felts, FNP  Chief Complaint  Patient presents with  . Right Knee - Pain  . Left Knee - Pain      HPI: Patient is a 60 year old woman who presents complaining of bilateral knee pain for years.  She states the right knee is worse than the left knee she has giving way and complains of crepitation in the patellofemoral joint with range of motion.  Patient denies any previous knee surgeries or injuries.  She states the pain is globally around her knee.  Assessment & Plan: Visit Diagnoses:  1. Chronic pain of both knees     Plan: Both knees were injected she tolerated this well plan to reevaluate in 4 weeks.  Follow-Up Instructions: No follow-ups on file.   Ortho Exam  Patient is alert, oriented, no adenopathy, well-dressed, normal affect, normal respiratory effort. Examination patient has a normal gait there is no effusion in either knee there is crepitation in the patellofemoral joint with range of motion worse on the right than the left collaterals and cruciates are stable the medial and lateral joint line are minimally tender to palpation.  Imaging: No results found. No images are attached to the encounter.  Labs: Lab Results  Component Value Date   HGBA1C 5.3 07/28/2020   HGBA1C 6.7 (H) 01/29/2020   HGBA1C 6.4 (H) 09/04/2019   ESRSEDRATE 47 (H) 09/04/2019   REPTSTATUS 04/25/2017 FINAL 04/23/2017   CULT NO GROUP A STREP (S.PYOGENES) ISOLATED 04/23/2017     Lab Results  Component Value Date   ALBUMIN 4.4 07/28/2020   ALBUMIN 3.9 01/29/2020   ALBUMIN 4.0 09/04/2019    No results found for: MG Lab Results  Component Value Date   VD25OH 62.9 07/28/2020   VD25OH 68.7 01/29/2020   VD25OH 21.0 (L) 04/19/2018    No  results found for: PREALBUMIN CBC EXTENDED Latest Ref Rng & Units 01/29/2020 04/19/2018 04/17/2018  WBC 3.4 - 10.8 x10E3/uL 10.2 6.0 8.4  RBC 3.77 - 5.28 x10E6/uL 5.00 4.90 5.21(H)  HGB 11.1 - 15.9 g/dL 64.3 32.9 11.9(L)  HCT 34.0 - 46.6 % 38.7 35.9 39.6  PLT 150 - 450 x10E3/uL 342 366 346  NEUTROABS 1.4 - 7.0 x10E3/uL - 3.5 4.2  LYMPHSABS 0.7 - 3.1 x10E3/uL - 2.0 3.3     Body mass index is 37.49 kg/m.  Orders:  Orders Placed This Encounter  Procedures  . XR Knee 1-2 Views Right  . XR Knee 1-2 Views Left   No orders of the defined types were placed in this encounter.    Procedures: Large Joint Inj: bilateral knee on 08/18/2020 10:51 AM Indications: pain and diagnostic evaluation Details: 22 G 1.5 in needle, anteromedial approach  Arthrogram: No  Outcome: tolerated well, no immediate complications Procedure, treatment alternatives, risks and benefits explained, specific risks discussed. Consent was given by the patient. Immediately prior to procedure a time out was called to verify the correct patient, procedure, equipment, support staff and site/side marked as required. Patient was prepped and draped in the usual sterile fashion.      Clinical Data: No additional findings.  ROS:  All  other systems negative, except as noted in the HPI. Review of Systems  Objective: Vital Signs: Ht 5\' 2"  (1.575 m)   Wt 205 lb (93 kg)   BMI 37.49 kg/m   Specialty Comments:  No specialty comments available.  PMFS History: Patient Active Problem List   Diagnosis Date Noted  . Class 2 severe obesity due to excess calories with serious comorbidity and body mass index (BMI) of 38.0 to 38.9 in adult (HCC) 02/29/2020  . Type 2 diabetes mellitus without complication, without long-term current use of insulin (HCC) 02/29/2020  . Vitamin D deficiency 02/29/2020  . Schizophrenia (HCC) 04/28/2018  . Essential hypertension 04/28/2018   Past Medical History:  Diagnosis Date  . Asthma   .  Bipolar 1 disorder (HCC)   . Hypertension   . Kidney stones   . Type 2 diabetes mellitus without complication, without long-term current use of insulin (HCC) 02/29/2020    Family History  Problem Relation Age of Onset  . Bipolar disorder Mother   . Schizophrenia Mother   . Diabetes Mother   . Cancer Mother   . Hypertension Mother   . Cancer Father   . Diabetes Father     Past Surgical History:  Procedure Laterality Date  . ABDOMINAL HYSTERECTOMY     partial   . APPENDECTOMY    . CHOLECYSTECTOMY     Social History   Occupational History  . Occupation: disability  Tobacco Use  . Smoking status: Never Smoker  . Smokeless tobacco: Never Used  Vaping Use  . Vaping Use: Never used  Substance and Sexual Activity  . Alcohol use: No  . Drug use: No  . Sexual activity: Not Currently    Birth control/protection: Surgical

## 2020-08-20 ENCOUNTER — Telehealth: Payer: Self-pay | Admitting: Nurse Practitioner

## 2020-08-20 NOTE — Telephone Encounter (Signed)
Left message for patient to call back and schedule Medicare Annual Wellness Visit (AWV) either virtually or in office.   Last AWV 05/29/2019 please schedule at anytime with [facility]    This should be a 45 minute visit.

## 2020-10-09 ENCOUNTER — Telehealth: Payer: Self-pay

## 2020-10-09 ENCOUNTER — Other Ambulatory Visit: Payer: Self-pay

## 2020-10-09 ENCOUNTER — Ambulatory Visit (INDEPENDENT_AMBULATORY_CARE_PROVIDER_SITE_OTHER): Payer: 59

## 2020-10-09 ENCOUNTER — Other Ambulatory Visit: Payer: Self-pay | Admitting: Nurse Practitioner

## 2020-10-09 VITALS — BP 126/82 | HR 80 | Temp 98.1°F | Ht 62.0 in | Wt 208.4 lb

## 2020-10-09 DIAGNOSIS — E119 Type 2 diabetes mellitus without complications: Secondary | ICD-10-CM | POA: Diagnosis not present

## 2020-10-09 DIAGNOSIS — Z0000 Encounter for general adult medical examination without abnormal findings: Secondary | ICD-10-CM

## 2020-10-09 DIAGNOSIS — Z23 Encounter for immunization: Secondary | ICD-10-CM

## 2020-10-09 DIAGNOSIS — N3000 Acute cystitis without hematuria: Secondary | ICD-10-CM

## 2020-10-09 LAB — POCT URINALYSIS DIPSTICK
Bilirubin, UA: NEGATIVE
Blood, UA: NEGATIVE
Glucose, UA: NEGATIVE
Ketones, UA: NEGATIVE
Leukocytes, UA: NEGATIVE
Nitrite, UA: POSITIVE
Protein, UA: NEGATIVE
Spec Grav, UA: 1.02
Urobilinogen, UA: 0.2 U/dL
pH, UA: 7

## 2020-10-09 LAB — POCT UA - MICROALBUMIN
Albumin/Creatinine Ratio, Urine, POC: <30
Creatinine, POC: 200 mg/dL
Microalbumin Ur, POC: 10 mg/L

## 2020-10-09 MED ORDER — TETANUS-DIPHTHERIA TOXOIDS TD 2-2 LF/0.5ML IM SUSP: 0.5000 mL | Freq: Once | INTRAMUSCULAR | 0 refills | Status: AC

## 2020-10-09 MED ORDER — TIZANIDINE HCL 4 MG PO TABS: 4.0000 mg | ORAL_TABLET | Freq: Four times a day (QID) | ORAL | 0 refills | Status: DC | PRN

## 2020-10-09 MED ORDER — NITROFURANTOIN MONOHYD MACRO 100 MG PO CAPS: 100.0000 mg | ORAL_CAPSULE | Freq: Two times a day (BID) | ORAL | 0 refills | Status: AC

## 2020-10-09 NOTE — Patient Instructions (Signed)
[name] , Thank you for taking time to come for your Medicare Wellness Visit. I appreciate your ongoing commitment to your health goals. Please review the following plan we discussed and let me know if I can assist you in the future.   Screening recommendations/referrals: Colonoscopy: completed 01/24/2020 Mammogram: patient to schedule Bone Density: n/a Recommended yearly ophthalmology/optometry visit for glaucoma screening and checkup Recommended yearly dental visit for hygiene and checkup  Vaccinations: Influenza vaccine: completed 07/28/2020, due 01/05/2021 Pneumococcal vaccine: n/a Tdap vaccine: sent to pharmacy Shingles vaccine: discussed  Covid-19:  09/27/2019, 08/30/2019  Advanced directives: Advance directive discussed with you today. I have provided a copy for you to complete at home and have notarized. Once this is complete please bring a copy in to our office so we can scan it into your chart.  Conditions/risks identified: none  Next appointment: Follow up in one year for your annual wellness visit.   Preventive Care 40-64 Years, Female Preventive care refers to lifestyle choices and visits with your health care provider that can promote health and wellness. What does preventive care include?  A yearly physical exam. This is also called an annual well check.  Dental exams once or twice a year.  Routine eye exams. Ask your health care provider how often you should have your eyes checked.  Personal lifestyle choices, including:  Daily care of your teeth and gums.  Regular physical activity.  Eating a healthy diet.  Avoiding tobacco and drug use.  Limiting alcohol use.  Practicing safe sex.  Taking low-dose aspirin daily starting at age 76.  Taking vitamin and mineral supplements as recommended by your health care provider. What happens during an annual well check? The services and screenings done by your health care provider during your annual well check will  depend on your age, overall health, lifestyle risk factors, and family history of disease. Counseling  Your health care provider may ask you questions about your:  Alcohol use.  Tobacco use.  Drug use.  Emotional well-being.  Home and relationship well-being.  Sexual activity.  Eating habits.  Work and work Statistician.  Method of birth control.  Menstrual cycle.  Pregnancy history. Screening  You may have the following tests or measurements:  Height, weight, and BMI.  Blood pressure.  Lipid and cholesterol levels. These may be checked every 5 years, or more frequently if you are over 54 years old.  Skin check.  Lung cancer screening. You may have this screening every year starting at age 15 if you have a 30-pack-year history of smoking and currently smoke or have quit within the past 15 years.  Fecal occult blood test (FOBT) of the stool. You may have this test every year starting at age 42.  Flexible sigmoidoscopy or colonoscopy. You may have a sigmoidoscopy every 5 years or a colonoscopy every 10 years starting at age 29.  Hepatitis C blood test.  Hepatitis B blood test.  Sexually transmitted disease (STD) testing.  Diabetes screening. This is done by checking your blood sugar (glucose) after you have not eaten for a while (fasting). You may have this done every 1-3 years.  Mammogram. This may be done every 1-2 years. Talk to your health care provider about when you should start having regular mammograms. This may depend on whether you have a family history of breast cancer.  BRCA-related cancer screening. This may be done if you have a family history of breast, ovarian, tubal, or peritoneal cancers.  Pelvic exam and  Pap test. This may be done every 3 years starting at age 69. Starting at age 42, this may be done every 5 years if you have a Pap test in combination with an HPV test.  Bone density scan. This is done to screen for osteoporosis. You may have  this scan if you are at high risk for osteoporosis. Discuss your test results, treatment options, and if necessary, the need for more tests with your health care provider. Vaccines  Your health care provider may recommend certain vaccines, such as:  Influenza vaccine. This is recommended every year.  Tetanus, diphtheria, and acellular pertussis (Tdap, Td) vaccine. You may need a Td booster every 10 years.  Zoster vaccine. You may need this after age 79.  Pneumococcal 13-valent conjugate (PCV13) vaccine. You may need this if you have certain conditions and were not previously vaccinated.  Pneumococcal polysaccharide (PPSV23) vaccine. You may need one or two doses if you smoke cigarettes or if you have certain conditions. Talk to your health care provider about which screenings and vaccines you need and how often you need them. This information is not intended to replace advice given to you by your health care provider. Make sure you discuss any questions you have with your health care provider. Document Released: 06/20/2015 Document Revised: 02/11/2016 Document Reviewed: 03/25/2015 Elsevier Interactive Patient Education  2017 [employer] Prevention in the Home Falls can cause injuries. They can happen to people of all ages. There are many things you can do to make your home safe and to help prevent falls. What can I do on the outside of my home?  Regularly fix the edges of walkways and driveways and fix any cracks.  Remove anything that might make you trip as you walk through a door, such as a raised step or threshold.  Trim any bushes or trees on the path to your home.  Use bright outdoor lighting.  Clear any walking paths of anything that might make someone trip, such as rocks or tools.  Regularly check to see if handrails are loose or broken. Make sure that both sides of any steps have handrails.  Any raised decks and porches should have guardrails on the  edges.  Have any leaves, snow, or ice cleared regularly.  Use sand or salt on walking paths during winter.  Clean up any spills in your garage right away. This includes oil or grease spills. What can I do in the bathroom?  Use night lights.  Install grab bars by the toilet and in the tub and shower. Do not use towel bars as grab bars.  Use non-skid mats or decals in the tub or shower.  If you need to sit down in the shower, use a plastic, non-slip stool.  Keep the floor dry. Clean up any water that spills on the floor as soon as it happens.  Remove soap buildup in the tub or shower regularly.  Attach bath mats securely with double-sided non-slip rug tape.  Do not have throw rugs and other things on the floor that can make you trip. What can I do in the bedroom?  Use night lights.  Make sure that you have a light by your bed that is easy to reach.  Do not use any sheets or blankets that are too big for your bed. They should not hang down onto the floor.  Have a firm chair that has side arms. You can use this for support while you  get dressed.  Do not have throw rugs and other things on the floor that can make you trip. What can I do in the kitchen?  Clean up any spills right away.  Avoid walking on wet floors.  Keep items that you use a lot in easy-to-reach places.  If you need to reach something above you, use a strong step stool that has a grab bar.  Keep electrical cords out of the way.  Do not use floor polish or wax that makes floors slippery. If you must use wax, use non-skid floor wax.  Do not have throw rugs and other things on the floor that can make you trip. What can I do with my stairs?  Do not leave any items on the stairs.  Make sure that there are handrails on both sides of the stairs and use them. Fix handrails that are broken or loose. Make sure that handrails are as long as the stairways.  Check any carpeting to make sure that it is firmly  attached to the stairs. Fix any carpet that is loose or worn.  Avoid having throw rugs at the top or bottom of the stairs. If you do have throw rugs, attach them to the floor with carpet tape.  Make sure that you have a light switch at the top of the stairs and the bottom of the stairs. If you do not have them, ask someone to add them for you. What else can I do to help prevent falls?  Wear shoes that:  Do not have high heels.  Have rubber bottoms.  Are comfortable and fit you well.  Are closed at the toe. Do not wear sandals.  If you use a stepladder:  Make sure that it is fully opened. Do not climb a closed stepladder.  Make sure that both sides of the stepladder are locked into place.  Ask someone to hold it for you, if possible.  Clearly mark and make sure that you can see:  Any grab bars or handrails.  First and last steps.  Where the edge of each step is.  Use tools that help you move around (mobility aids) if they are needed. These include:  Canes.  Walkers.  Scooters.  Crutches.  Turn on the lights when you go into a dark area. Replace any light bulbs as soon as they burn out.  Set up your furniture so you have a clear path. Avoid moving your furniture around.  If any of your floors are uneven, fix them.  If there are any pets around you, be aware of where they are.  Review your medicines with your doctor. Some medicines can make you feel dizzy. This can increase your chance of falling. Ask your doctor what other things that you can do to help prevent falls. This information is not intended to replace advice given to you by your health care provider. Make sure you discuss any questions you have with your health care provider. Document Released: 03/20/2009 Document Revised: 10/30/2015 Document Reviewed: 06/28/2014 Elsevier Interactive Patient Education  2017 [employer].

## 2020-10-09 NOTE — Progress Notes (Signed)
This visit occurred during the SARS-CoV-2 public health emergency.  Safety protocols were in place, including screening questions prior to the visit, additional usage of staff PPE, and extensive cleaning of exam room while observing appropriate contact time as indicated for disinfecting solutions.  Subjective:   Emily Cochran is a 60 y.o. female who presents for Medicare Annual (Subsequent) preventive examination.  Review of Systems     Cardiac Risk Factors include: obesity (BMI >30kg/m2);sedentary lifestyle;diabetes mellitus;hypertension     Objective:    Today's Vitals   10/09/20 1207 10/09/20 1216  BP: 126/82   Pulse: 80   Temp: 98.1 F (36.7 C)   TempSrc: Oral   SpO2: 99%   Weight: 208 lb 6.4 oz (94.5 kg)   Height: 5\' 2"  (1.575 m)   PainSc:  10-Worst pain ever   Body mass index is 38.12 kg/m.  Advanced Directives 10/09/2020 05/29/2019 04/17/2018 03/16/2018 11/08/2016 08/27/2016 08/13/2016  Does Patient Have a Medical Advance Directive? No No No No No No No  Would patient like information on creating a medical advance directive? Yes (MAU/Ambulatory/Procedural Areas - Information given) - No - Patient declined - No - Patient declined No - Patient declined No - Patient declined    Current Medications (verified) Outpatient Encounter Medications as of 10/09/2020  Medication Sig  . ACCU-CHEK GUIDE test strip CHECK BLOOD SUGAR UP TO 4 TIMES DAILY  . ACCU-CHEK SOFTCLIX LANCETS lancets CHECK BLOOD SUGAR UP TO 4 TIMES DAILY  . albuterol (VENTOLIN HFA) 108 (90 Base) MCG/ACT inhaler INHALE 2 PUFFS INTO THE LUNGS EVERY 6 HOURS AS NEEDED FOR WHEEZING OR SHORTNESS OF BREATH  . amLODipine (NORVASC) 5 MG tablet Take 1 tablet (5 mg total) by mouth daily.  . Diphenhydramine-APAP, sleep, (GOODY PM PO) Take 1 Package by mouth daily as needed (pain).   12/09/2020 diptheria-tetanus toxoids (DECAVAC) 2-2 LF/0.5ML injection Inject 0.5 mLs into the muscle once for 1 dose.  . fluticasone (FLONASE) 50 MCG/ACT  nasal spray Place 2 sprays into both nostrils daily.  . metoprolol tartrate (LOPRESSOR) 25 MG tablet Take 1 tablet (25 mg total) by mouth 2 (two) times daily.  . pravastatin (PRAVACHOL) 20 MG tablet TAKE 1 TABLET BY MOUTH MONDAY WEDNESDAY AND FRIDAY  . pregabalin (LYRICA) 75 MG capsule Take 1 capsule (75 mg total) by mouth daily.  . QUEtiapine (SEROQUEL) 200 MG tablet Take 200 mg by mouth at bedtime.   . Semaglutide,0.25 or 0.5MG /DOS, (OZEMPIC, 0.25 OR 0.5 MG/DOSE,) 2 MG/1.5ML SOPN Inject 0.5 mg into the skin once a week.  Marland Kitchen tiZANidine (ZANAFLEX) 4 MG tablet Take 1 tablet (4 mg total) by mouth every 6 (six) hours as needed for muscle spasms.  Marland Kitchen HYDROcodone-acetaminophen (NORCO/VICODIN) 5-325 MG tablet Take 1 tablet by mouth every 6 (six) hours as needed for severe pain. (Patient not taking: Reported on 10/09/2020)   No facility-administered encounter medications on file as of 10/09/2020.    Allergies (verified) Coconut flavor, Shellfish allergy, Ultram [tramadol], and Aspirin   History: Past Medical History:  Diagnosis Date  . Asthma   . Bipolar 1 disorder (HCC)   . Hypertension   . Kidney stones   . Type 2 diabetes mellitus without complication, without long-term current use of insulin (HCC) 02/29/2020   Past Surgical History:  Procedure Laterality Date  . ABDOMINAL HYSTERECTOMY     partial   . APPENDECTOMY    . CHOLECYSTECTOMY     Family History  Problem Relation Age of Onset  . Bipolar disorder Mother   .  Schizophrenia Mother   . Diabetes Mother   . Cancer Mother   . Hypertension Mother   . Cancer Father   . Diabetes Father    Social History   Socioeconomic History  . Marital status: Single    Spouse name: Not on file  . Number of children: Not on file  . Years of education: Not on file  . Highest education level: Not on file  Occupational History  . Occupation: disability  Tobacco Use  . Smoking status: Never Smoker  . Smokeless tobacco: Never Used  Vaping Use  .  Vaping Use: Never used  Substance and Sexual Activity  . Alcohol use: No  . Drug use: No  . Sexual activity: Not Currently    Birth control/protection: Surgical  Other Topics Concern  . Not on file  Social History Narrative  . Not on file   Social Determinants of Health   Financial Resource Strain: Low Risk   . Difficulty of Paying Living Expenses: Not hard at all  Food Insecurity: No Food Insecurity  . Worried About Programme researcher, broadcasting/film/video in the Last Year: Never true  . Ran Out of Food in the Last Year: Never true  Transportation Needs: No Transportation Needs  . Lack of Transportation (Medical): No  . Lack of Transportation (Non-Medical): No  Physical Activity: Inactive  . Days of Exercise per Week: 0 days  . Minutes of Exercise per Session: 0 min  Stress: No Stress Concern Present  . Feeling of Stress : Not at all  Social Connections: Not on file    Tobacco Counseling Counseling given: Not Answered   Clinical Intake:  Pre-visit preparation completed: Yes  Pain : 0-10 Pain Score: 10-Worst pain ever Pain Type: Chronic pain Pain Location: Knee Pain Orientation: Right,Left Pain Descriptors / Indicators: Aching,Stabbing,Shooting Pain Onset: More than a month ago Pain Frequency: Constant     Nutritional Status: BMI > 30  Obese Nutritional Risks: None Diabetes: Yes  How often do you need to have someone help you when you read instructions, pamphlets, or other written materials from your doctor or pharmacy?: 1 - Never What is the last grade level you completed in school?: college  Diabetic? Yes Nutrition Risk Assessment:  Has the patient had any N/V/D within the last 2 months?  No  Does the patient have any non-healing wounds?  No  Has the patient had any unintentional weight loss or weight gain?  No   Diabetes:  Is the patient diabetic?  Yes  If diabetic, was a CBG obtained today?  No  Did the patient bring in their glucometer from home?  No  How often do  you monitor your CBG's? daily.   Financial Strains and Diabetes Management:  Are you having any financial strains with the device, your supplies or your medication? No .  Does the patient want to be seen by Chronic Care Management for management of their diabetes?  No  Would the patient like to be referred to a Nutritionist or for Diabetic Management?  No   Diabetic Exams:  Diabetic Eye Exam: Overdue for diabetic eye exam. Pt has been advised about the importance in completing this exam. Patient advised to call and schedule an eye exam. Diabetic Foot Exam: Completed 01/29/2020   Interpreter Needed?: No  Information entered by :: NAllen LPN   Activities of Daily Living In your present state of health, do you have any difficulty performing the following activities: 10/09/2020  Hearing? N  Vision?  N  Difficulty concentrating or making decisions? N  Walking or climbing stairs? N  Dressing or bathing? N  Doing errands, shopping? N  Preparing Food and eating ? N  Using the Toilet? N  In the past six months, have you accidently leaked urine? N  Do you have problems with loss of bowel control? N  Managing your Medications? N  Managing your Finances? N  Housekeeping or managing your Housekeeping? N  Some recent data might be hidden    Patient Care Team: Arnette Felts, FNP as PCP - General (General Practice) Little, Karma Lew, RN as Case Manager  Indicate any recent Medical Services you may have received from other than [facility] providers in the past year (date may be approximate).     Assessment:   This is a routine wellness examination for Emily Cochran.  Hearing/Vision screen  Hearing Screening   125Hz  250Hz  500Hz  1000Hz  2000Hz  3000Hz  4000Hz  6000Hz  8000Hz   Right ear:           Left ear:           Vision Screening Comments: Regular eye exams,   Dietary issues and exercise activities discussed: Current Exercise Habits: The patient has a physically strenuous job, but has no regular  exercise apart from work.  Goals Addressed            This Visit's Progress   . Patient Stated       10/09/2020, wants to get rid of diabetes and pains      Depression Screen PHQ 2/9 Scores 10/09/2020 05/29/2019 05/24/2019 11/22/2018 09/19/2018 04/19/2018  PHQ - 2 Score 0 0 0 0 0 1  PHQ- 9 Score - 0 - - - 1    Fall Risk Fall Risk  10/09/2020 05/29/2019 05/24/2019 11/22/2018 09/19/2018  Falls in the past year? 0 0 0 0 0  Risk for fall due to : Medication side effect Medication side effect - - -  Follow up Falls evaluation completed;Education provided;Falls prevention discussed Falls evaluation completed;Education provided;Falls prevention discussed - - -    FALL RISK PREVENTION PERTAINING TO THE HOME:  Any stairs in or around the home? No  If so, are there any without handrails? n/a Home free of loose throw rugs in walkways, pet beds, electrical cords, etc? Yes  Adequate lighting in your home to reduce risk of falls? Yes   ASSISTIVE DEVICES UTILIZED TO PREVENT FALLS:  Life alert? No  Use of a cane, walker or w/c? No  Grab bars in the bathroom? Yes  Shower chair or bench in shower? No  Elevated toilet seat or a handicapped toilet? No   TIMED UP AND GO:  Was the test performed? No .    Gait steady and fast without use of assistive device  Cognitive Function:     6CIT Screen 10/09/2020 05/29/2019  What Year? 0 points 0 points  What month? 0 points 0 points  What time? 0 points 0 points  Count back from 20 0 points 0 points  Months in reverse 0 points 0 points  Repeat phrase 6 points 0 points  Total Score 6 0    Immunizations Immunization History  Administered Date(s) Administered  . Influenza,inj,Quad PF,6+ Mos 07/28/2020  . PFIZER(Purple Top)SARS-COV-2 Vaccination 08/30/2019, 09/27/2019  . Pneumococcal Polysaccharide-23 01/29/2020    TDAP status: Due, Education has been provided regarding the importance of this vaccine. Advised may receive this vaccine at local  pharmacy or Health Dept. Aware to provide a copy of the vaccination record  if obtained from local pharmacy or Health Dept. Verbalized acceptance and understanding.  Flu Vaccine status: Up to date  Pneumococcal vaccine status: Up to date  Covid-19 vaccine status: Completed vaccines  Qualifies for Shingles Vaccine? Yes   Zostavax completed No   Shingrix Completed?: No.    Education has been provided regarding the importance of this vaccine. Patient has been advised to call insurance company to determine out of pocket expense if they have not yet received this vaccine. Advised may also receive vaccine at local pharmacy or Health Dept. Verbalized acceptance and understanding.  Screening Tests Health Maintenance  Topic Date Due  . OPHTHALMOLOGY EXAM  Never done  . TETANUS/TDAP  Never done  . COVID-19 Vaccine (3 - Booster for Pfizer series) 03/28/2020  . INFLUENZA VACCINE  01/05/2021  . HEMOGLOBIN A1C  01/25/2021  . FOOT EXAM  01/28/2021  . MAMMOGRAM  07/17/2021  . URINE MICROALBUMIN  10/09/2021  . COLONOSCOPY (Pts [id number] Insurance coverage will need to be confirmed)  01/23/2030  . PNEUMOCOCCAL POLYSACCHARIDE VACCINE AGE 78-64 HIGH RISK  Completed  . Hepatitis C Screening  Completed  . HIV Screening  Completed  . HPV VACCINES  Aged Out  . PAP SMEAR-Modifier  Discontinued    Health Maintenance  Health Maintenance Due  Topic Date Due  . OPHTHALMOLOGY EXAM  Never done  . TETANUS/TDAP  Never done  . COVID-19 Vaccine (3 - Booster for Pfizer series) 03/28/2020    Colorectal cancer screening: Type of screening: Colonoscopy. Completed 01/24/2020. Repeat every 10 years  Mammogram status: patient to schedule  Bone Density status: n/a  Lung Cancer Screening: (Low Dose CT Chest recommended if Age 39-80 years, 30 pack-year currently smoking OR have quit w/in 15years.) does not qualify.   Lung Cancer Screening Referral: no  Additional Screening:  Hepatitis C Screening: does qualify;  Completed 04/19/2018  Vision Screening: Recommended annual ophthalmology exams for early detection of glaucoma and other disorders of the eye. Is the patient up to date with their annual eye exam?  No  Who is the provider or what is the name of the office in which the patient attends annual eye exams? Can't remember name If pt is not established with a provider, would they like to be referred to a provider to establish care? No .   Dental Screening: Recommended annual dental exams for proper oral hygiene  Community Resource Referral / Chronic Care Management: CRR required this visit?  No   CCM required this visit?  No      Plan:     I have personally reviewed and noted the following in the patient's chart:   . Medical and social history . Use of alcohol, tobacco or illicit drugs  . Current medications and supplements including opioid prescriptions.  . Functional ability and status . Nutritional status . Physical activity . Advanced directives . List of other physicians . Hospitalizations, surgeries, and ER visits in previous 12 months . Vitals . Screenings to include cognitive, depression, and falls . Referrals and appointments  In addition, I have reviewed and discussed with patient certain preventive protocols, quality metrics, and best practice recommendations. A written personalized care plan for preventive services as well as general preventive health recommendations were provided to patient.     Barb Merinoickeah E Avya Flavell, LPN   6/2/95285/10/2020   Nurse Notes:

## 2020-10-09 NOTE — Telephone Encounter (Signed)
I called Emily Cochran to notify her that she has a UTI and Ms.Moore sent her a prescription to the pharmacy. YL,RMA

## 2020-10-15 LAB — URINE CULTURE

## 2020-10-16 ENCOUNTER — Telehealth: Payer: Self-pay

## 2020-10-16 NOTE — Telephone Encounter (Signed)
Left the patient a message to call back for lab results or she can view them on her mychart. 

## 2020-10-16 NOTE — Telephone Encounter (Signed)
-----   Message from Arnette Felts, FNP sent at 10/15/2020 12:55 PM EDT ----- You definitely had a urinary tract infection. The medication you were given should be effective. How are you feeling?

## 2020-10-17 ENCOUNTER — Other Ambulatory Visit: Payer: Self-pay | Admitting: Nurse Practitioner

## 2020-10-19 ENCOUNTER — Other Ambulatory Visit: Payer: Self-pay | Admitting: Nurse Practitioner

## 2020-10-20 ENCOUNTER — Other Ambulatory Visit: Payer: Self-pay | Admitting: Nurse Practitioner

## 2020-10-20 DIAGNOSIS — E119 Type 2 diabetes mellitus without complications: Secondary | ICD-10-CM

## 2020-10-21 ENCOUNTER — Other Ambulatory Visit: Payer: Self-pay | Admitting: Nurse Practitioner

## 2020-10-21 DIAGNOSIS — R946 Abnormal results of thyroid function studies: Secondary | ICD-10-CM

## 2020-10-28 ENCOUNTER — Ambulatory Visit: Payer: Medicaid Other | Admitting: Nurse Practitioner

## 2020-11-06 ENCOUNTER — Encounter: Payer: Self-pay | Admitting: Orthopedic Surgery

## 2020-11-06 ENCOUNTER — Ambulatory Visit: Payer: Self-pay

## 2020-11-06 ENCOUNTER — Ambulatory Visit (INDEPENDENT_AMBULATORY_CARE_PROVIDER_SITE_OTHER): Payer: 59 | Admitting: Physician Assistant

## 2020-11-06 DIAGNOSIS — M25562 Pain in left knee: Secondary | ICD-10-CM

## 2020-11-06 DIAGNOSIS — M25561 Pain in right knee: Secondary | ICD-10-CM | POA: Diagnosis not present

## 2020-11-06 DIAGNOSIS — M545 Low back pain, unspecified: Secondary | ICD-10-CM | POA: Diagnosis not present

## 2020-11-06 MED ORDER — LIDOCAINE HCL 1 % IJ SOLN
5.0000 mL | INTRAMUSCULAR | Status: AC | PRN
  Administered 2020-11-06: 5 mL

## 2020-11-06 MED ORDER — METHYLPREDNISOLONE ACETATE 40 MG/ML IJ SUSP
40.0000 mg | INTRAMUSCULAR | Status: AC | PRN
Start: 2020-11-06 — End: 2020-11-06
  Administered 2020-11-06: 40 mg via INTRA_ARTICULAR

## 2020-11-06 MED ORDER — METHYLPREDNISOLONE ACETATE 40 MG/ML IJ SUSP
40.0000 mg | INTRAMUSCULAR | Status: AC | PRN
  Administered 2020-11-06: 40 mg via INTRA_ARTICULAR

## 2020-11-06 NOTE — Progress Notes (Signed)
Office Visit Note   Patient: Emily Cochran           Date of Birth: Aug 31, 1960           MRN: [id number] Visit Date: 11/06/2020              Requested by: Arnette Felts, FNP [address] [place],  [place] [address] PCP: Arnette Felts, FNP  Chief Complaint  Patient presents with  . Left Knee - Pain  . Right Knee - Pain      HPI: Patient presents in follow-up today requesting bilateral knee injections.  She is also complaining of what she describes as hip pain.  She points to her posterior buttocks and says the pain radiates down the outside of her leg.  No associated radicular symptoms.  Assessment & Plan: Visit Diagnoses:  1. Low back pain, unspecified back pain laterality, unspecified chronicity, unspecified whether sciatica present     Plan: We will try her on a course of prednisone 1 daily with breakfast.  Also we will go forward with injecting both of her knees today.  She will follow-up in 3 weeks.  If she does not have significant relief from prednisone would recommend an MRI  Follow-Up Instructions: No follow-ups on file.   Ortho Exam  Patient is alert, oriented, no adenopathy, well-dressed, normal affect, normal respiratory effort. Bilateral knees no effusion mild soft tissue swelling in the right knee.  No erythema or signs of infection.  She has crepitus and pain with range of motion of both knees which is global. Examination of her spine she has a mildly positive straight leg raise.  With flexion of her back she has pulling in the posterior buttock as well as with extension.  Strength is 5 out of 5   Imaging: XR Lumbar Spine 2-3 Views  Result Date: 11/06/2020 Patient has noted lordosis.  She has narrowing at L3-4 L4-5.  With associated spurring.  Minimal degenerative changes of her hip  No images are attached to the encounter.  Labs: Lab Results  Component Value Date   HGBA1C 5.3 07/28/2020   HGBA1C 6.7 (H) 01/29/2020   HGBA1C 6.4 (H)  09/04/2019   ESRSEDRATE 47 (H) 09/04/2019   REPTSTATUS 04/25/2017 FINAL 04/23/2017   CULT NO GROUP A STREP (S.PYOGENES) ISOLATED 04/23/2017     Lab Results  Component Value Date   ALBUMIN 4.4 07/28/2020   ALBUMIN 3.9 01/29/2020   ALBUMIN 4.0 09/04/2019    No results found for: MG Lab Results  Component Value Date   VD25OH 62.9 07/28/2020   VD25OH 68.7 01/29/2020   VD25OH 21.0 (L) 04/19/2018    No results found for: PREALBUMIN CBC EXTENDED Latest Ref Rng & Units 01/29/2020 04/19/2018 04/17/2018  WBC 3.4 - 10.8 x10E3/uL 10.2 6.0 8.4  RBC 3.77 - 5.28 x10E6/uL 5.00 4.90 5.21(H)  HGB 11.1 - 15.9 g/dL 93.9 03.0 11.9(L)  HCT 34.0 - 46.6 % 38.7 35.9 39.6  PLT 150 - 450 x10E3/uL 342 366 346  NEUTROABS 1.4 - 7.0 x10E3/uL - 3.5 4.2  LYMPHSABS 0.7 - 3.1 x10E3/uL - 2.0 3.3     There is no height or weight on file to calculate BMI.  Orders:  Orders Placed This Encounter  Procedures  . XR Lumbar Spine 2-3 Views   No orders of the defined types were placed in this encounter.    Procedures: Large Joint Inj: bilateral knee on 11/06/2020 9:00 AM Indications: pain and diagnostic evaluation Details: 22 G 1.5  in needle, anteromedial approach  Arthrogram: No  Medications (Right): 5 mL lidocaine 1 %; 40 mg methylPREDNISolone acetate 40 MG/ML Medications (Left): 5 mL lidocaine 1 %; 40 mg methylPREDNISolone acetate 40 MG/ML Outcome: tolerated well, no immediate complications Procedure, treatment alternatives, risks and benefits explained, specific risks discussed. Consent was given by the patient. Immediately prior to procedure a time out was called to verify the correct patient, procedure, equipment, support staff and site/side marked as required. Patient was prepped and draped in the usual sterile fashion.      Clinical Data: No additional findings.  ROS:  All other systems negative, except as noted in the HPI. Review of Systems  Objective: Vital Signs: There were no vitals  taken for this visit.  Specialty Comments:  No specialty comments available.  PMFS History: Patient Active Problem List   Diagnosis Date Noted  . Class 2 severe obesity due to excess calories with serious comorbidity and body mass index (BMI) of 38.0 to 38.9 in adult (HCC) 02/29/2020  . Type 2 diabetes mellitus without complication, without long-term current use of insulin (HCC) 02/29/2020  . Vitamin D deficiency 02/29/2020  . Schizophrenia (HCC) 04/28/2018  . Essential hypertension 04/28/2018   Past Medical History:  Diagnosis Date  . Asthma   . Bipolar 1 disorder (HCC)   . Hypertension   . Kidney stones   . Type 2 diabetes mellitus without complication, without long-term current use of insulin (HCC) 02/29/2020    Family History  Problem Relation Age of Onset  . Bipolar disorder Mother   . Schizophrenia Mother   . Diabetes Mother   . Cancer Mother   . Hypertension Mother   . Cancer Father   . Diabetes Father     Past Surgical History:  Procedure Laterality Date  . ABDOMINAL HYSTERECTOMY     partial   . APPENDECTOMY    . CHOLECYSTECTOMY     Social History   Occupational History  . Occupation: disability  Tobacco Use  . Smoking status: Never Smoker  . Smokeless tobacco: Never Used  Vaping Use  . Vaping Use: Never used  Substance and Sexual Activity  . Alcohol use: No  . Drug use: No  . Sexual activity: Not Currently    Birth control/protection: Surgical

## 2020-11-07 LAB — HM DIABETES EYE EXAM

## 2020-11-10 ENCOUNTER — Encounter: Payer: Self-pay | Admitting: Nurse Practitioner

## 2020-11-27 ENCOUNTER — Ambulatory Visit: Payer: 59 | Admitting: Orthopedic Surgery

## 2020-12-09 ENCOUNTER — Encounter: Payer: Self-pay | Admitting: Nurse Practitioner

## 2020-12-09 ENCOUNTER — Ambulatory Visit (INDEPENDENT_AMBULATORY_CARE_PROVIDER_SITE_OTHER): Payer: 59 | Admitting: Nurse Practitioner

## 2020-12-09 ENCOUNTER — Other Ambulatory Visit: Payer: Self-pay

## 2020-12-09 VITALS — BP 124/72 | HR 85 | Temp 98.0°F | Ht 62.4 in | Wt 210.2 lb

## 2020-12-09 DIAGNOSIS — M79671 Pain in right foot: Secondary | ICD-10-CM

## 2020-12-09 DIAGNOSIS — E6609 Other obesity due to excess calories: Secondary | ICD-10-CM | POA: Diagnosis not present

## 2020-12-09 DIAGNOSIS — M797 Fibromyalgia: Secondary | ICD-10-CM | POA: Diagnosis not present

## 2020-12-09 DIAGNOSIS — Z111 Encounter for screening for respiratory tuberculosis: Secondary | ICD-10-CM

## 2020-12-09 DIAGNOSIS — I10 Essential (primary) hypertension: Secondary | ICD-10-CM | POA: Diagnosis not present

## 2020-12-09 DIAGNOSIS — E119 Type 2 diabetes mellitus without complications: Secondary | ICD-10-CM | POA: Diagnosis not present

## 2020-12-09 DIAGNOSIS — Z23 Encounter for immunization: Secondary | ICD-10-CM

## 2020-12-09 DIAGNOSIS — Z6837 Body mass index (BMI) 37.0-37.9, adult: Secondary | ICD-10-CM

## 2020-12-09 MED ORDER — OZEMPIC (0.25 OR 0.5 MG/DOSE) 2 MG/1.5ML ~~LOC~~ SOPN: 0.5000 mg | PEN_INJECTOR | SUBCUTANEOUS | 1 refills | Status: DC

## 2020-12-09 MED ORDER — SHINGRIX 50 MCG/0.5ML IM SUSR: 0.5000 mL | Freq: Once | INTRAMUSCULAR | 0 refills | Status: AC

## 2020-12-09 MED ORDER — AMLODIPINE BESYLATE 5 MG PO TABS: 5.0000 mg | ORAL_TABLET | Freq: Every day | ORAL | 1 refills | Status: DC

## 2020-12-09 MED ORDER — PREGABALIN 75 MG PO CAPS: 75.0000 mg | ORAL_CAPSULE | Freq: Every day | ORAL | 5 refills | Status: DC

## 2020-12-09 MED ORDER — TETANUS-DIPHTH-ACELL PERTUSSIS 5-2.5-18.5 LF-MCG/0.5 IM SUSP: 0.5000 mL | Freq: Once | INTRAMUSCULAR | 0 refills | Status: AC

## 2020-12-09 NOTE — Progress Notes (Signed)
I,Yamilka Roman [employer] as a Education administrator for [employer], FNP.,have documented all relevant documentation on the behalf of [name], FNP,as directed by  [name], FNP while in the presence of [name], [place].  This visit occurred during the SARS-CoV-2 public health emergency.  Safety protocols were in place, including screening questions prior to the visit, additional usage of staff PPE, and extensive cleaning of exam room while observing appropriate contact time as indicated for disinfecting solutions.  Subjective:     Patient ID: Emily Cochran , female    DOB: [birth year] , 60 y.o.   MRN: [id number]   Chief Complaint  Patient presents with   Diabetes   Hypertension    HPI  Patient presents today for a blood pressure and dm f/u. 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   Wt Readings from Last 3 Encounters: 12/09/20 : 210 lb 3.2 oz (95.3 kg) 10/09/20 : 208 lb 6.4 oz (94.5 kg) 08/18/20 : 205 lb (93 kg)    Diabetes She presents for her follow-up diabetic visit. She has type 2 diabetes mellitus. Hypoglycemia symptoms include headaches. Pertinent negatives for hypoglycemia include no dizziness. Pertinent negatives for diabetes include no chest pain. There are no hypoglycemic complications. Risk factors for coronary artery disease include obesity and sedentary lifestyle. Current diabetic treatment includes oral agent (monotherapy). When asked about meal planning, she reported none. She has not had a previous visit with a dietitian. (Blood sugar has been averaging 120) An ACE inhibitor/angiotensin II receptor blocker is not being taken. She does not see a podiatrist.Eye exam current: 11/07/2020.  Hypertension This is a chronic problem. The current episode started more than 1 year ago. The problem is controlled. Associated symptoms include headaches. Pertinent negatives include no anxiety, chest pain or palpitations. Risk factors for coronary artery disease  include obesity, sedentary lifestyle and diabetes mellitus.    Past Medical History:  Diagnosis Date   Asthma    Bipolar 1 disorder ([place])    Hypertension    Kidney stones    Type 2 diabetes mellitus without complication, without long-term current use of insulin ([place]) 02/29/2020     Family History  Problem Relation Age of Onset   Bipolar disorder Mother    Schizophrenia Mother    Diabetes Mother    Cancer Mother    Hypertension Mother    Cancer Father    Diabetes Father      Current Outpatient Medications:    ACCU-CHEK GUIDE test strip, CHECK BLOOD SUGAR UP TO 4 TIMES DAILY, Disp: , Rfl: 0   ACCU-CHEK SOFTCLIX LANCETS lancets, CHECK BLOOD SUGAR UP TO 4 TIMES DAILY, Disp: 300 each, Rfl: 1   albuterol (VENTOLIN HFA) 108 (90 Base) MCG/ACT inhaler, INHALE 2 PUFFS INTO THE LUNGS EVERY 6 HOURS AS NEEDED FOR WHEEZING OR SHORTNESS OF BREATH, Disp: 54 g, Rfl: 1   Diphenhydramine-APAP, sleep, (GOODY PM PO), Take 1 Package by mouth daily as needed (pain). , Disp: , Rfl:    fluticasone (FLONASE) 50 MCG/ACT nasal spray, Place 2 sprays into both nostrils daily., Disp: 16 g, Rfl: 12   metoprolol tartrate (LOPRESSOR) 25 MG tablet, TAKE 1 TABLET(25 MG) BY MOUTH TWICE DAILY, Disp: 180 tablet, Rfl: 1   pravastatin (PRAVACHOL) 20 MG tablet, TAKE 1 TABLET BY MOUTH MONDAY WEDNESDAY AND FRIDAY, Disp: 45 tablet, Rfl: 1   QUEtiapine (SEROQUEL) 200 MG tablet, Take 200 mg by mouth at bedtime. , Disp: , Rfl: 0  tiZANidine (ZANAFLEX) 4 MG tablet, TAKE 1 TABLET(4 MG) BY MOUTH EVERY 6 HOURS AS NEEDED FOR MUSCLE SPASMS, Disp: 30 tablet, Rfl: 0   amLODipine (NORVASC) 5 MG tablet, Take 1 tablet (5 mg total) by mouth daily., Disp: 90 tablet, Rfl: 1   HYDROcodone-acetaminophen (NORCO/VICODIN) 5-325 MG tablet, Take 1 tablet by mouth every 6 (six) hours as needed for severe pain. (Patient not taking: No sig reported), Disp: 8 tablet, Rfl: 0   pregabalin (LYRICA) 75 MG capsule, Take 1 capsule (75 mg total) by mouth  daily., Disp: 30 capsule, Rfl: 5   Semaglutide,0.25 or 0.5MG/DOS, (OZEMPIC, 0.25 OR 0.5 MG/DOSE,) 2 MG/1.5ML SOPN, Inject 0.5 mg into the skin once a week., Disp: 4.5 mL, Rfl: 1   Allergies  Allergen Reactions   Coconut Flavor Shortness Of Breath and Swelling   Shellfish Allergy Shortness Of Breath and Swelling    She can eat shrimp   Ultram [Tramadol] Anaphylaxis    Pt REPORTS THROAT SWELLING   Aspirin Swelling and Other (See Comments)    Angioedema, cuts on lips     Review of Systems  Constitutional: Negative.   HENT: Negative.    Eyes: Negative.   Respiratory: Negative.    Cardiovascular: Negative.  Negative for chest pain, palpitations and leg swelling.  Gastrointestinal: Negative.   Endocrine: Negative.   Genitourinary: Negative.   Musculoskeletal: Negative.   Skin: Negative.   Neurological:  Positive for headaches. Negative for dizziness.  Hematological: Negative.   Psychiatric/Behavioral: Negative.      Today's Vitals   12/09/20 0855  BP: 124/72  Pulse: 85  Temp: 98 F (36.7 C)  Weight: 210 lb 3.2 oz (95.3 kg)  Height: 5' 2.4" (1.585 m)  PainSc: 10-Worst pain ever  PainLoc: Foot   Body mass index is 37.95 kg/m.   Objective:  Physical Exam Vitals reviewed.  Constitutional:      General: She is not in acute distress.    Appearance: Normal appearance. She is obese.  Cardiovascular:     Rate and Rhythm: Normal rate and regular rhythm.     Pulses: Normal pulses.     Heart sounds: Normal heart sounds. No murmur heard. Pulmonary:     Effort: Pulmonary effort is normal. No respiratory distress.     Breath sounds: Normal breath sounds. No wheezing.  Musculoskeletal:        General: Tenderness (right lateral foot tenderness) present. No swelling. Normal range of motion.     Cervical back: Normal range of motion and neck supple.  Skin:    General: Skin is warm.     Capillary Refill: Capillary refill takes less than 2 seconds.  Neurological:     General: No  focal deficit present.     Mental Status: She is alert and oriented to person, place, and time.  Psychiatric:        Mood and Affect: Mood normal.        Behavior: Behavior normal.        Thought Content: Thought content normal.        Judgment: Judgment normal.        Assessment And Plan:     1. Type 2 diabetes mellitus without complication, without long-term current use of insulin (HCC) Comments: Stable, continue with current medications - Semaglutide,0.25 or 0.5MG/DOS, (OZEMPIC, 0.25 OR 0.5 MG/DOSE,) 2 MG/1.5ML SOPN; Inject 0.5 mg into the skin once a week.  Dispense: 4.5 mL; Refill: 1 - Lipid panel - Hemoglobin A1c  2. Essential hypertension  Comments: Blood pressure is well controlled - amLODipine (NORVASC) 5 MG tablet; Take 1 tablet (5 mg total) by mouth daily.  Dispense: 90 tablet; Refill: 1 - Lipid panel - CMP14+EGFR  3. Class 2 obesity due to excess calories with body mass index (BMI) of 37.0 to 37.9 in adult, unspecified whether serious comorbidity present  4. Fibromyalgia Comments: Stable, no changes at this time - pregabalin (LYRICA) 75 MG capsule; Take 1 capsule (75 mg total) by mouth daily.  Dispense: 30 capsule; Refill: 5  5. Right foot pain Comments: Tenderness to right lateral foot Will check foot xray - DG Foot Complete Right; Future  6. Encounter for immunization - Zoster Vaccine Adjuvanted [facility]) injection; Inject 0.5 mLs into the muscle once for 1 dose.  Dispense: 0.5 mL; Refill: 0 - Tdap (BOOSTRIX) 5-2.5-18.5 LF-MCG/0.5 injection; Inject 0.5 mLs into the muscle once for 1 dose.  Dispense: 0.5 mL; Refill: 0  7. Screening for tuberculosis Comments: Needs for employment - TB Skin Test     Patient was given opportunity to ask questions. Patient verbalized understanding of the plan and was able to repeat key elements of the plan. All questions were answered to their satisfaction.  [name], FNP    I, [name], FNP, have reviewed all  documentation for this visit. The documentation on 12/09/20 for the exam, diagnosis, procedures, and orders are all accurate and complete.   IF YOU HAVE BEEN REFERRED TO A SPECIALIST, IT MAY TAKE 1-2 WEEKS TO SCHEDULE/PROCESS THE REFERRAL. IF YOU HAVE NOT HEARD FROM US/SPECIALIST IN TWO WEEKS, PLEASE GIVE [place] A CALL AT [phone].   THE PATIENT IS ENCOURAGED TO PRACTICE SOCIAL DISTANCING DUE TO THE COVID-19 PANDEMIC.

## 2020-12-09 NOTE — Patient Instructions (Signed)

## 2020-12-11 LAB — TB SKIN TEST
Induration: 0 mm
TB Skin Test: NEGATIVE

## 2021-01-01 DIAGNOSIS — M797 Fibromyalgia: Secondary | ICD-10-CM | POA: Insufficient documentation

## 2021-01-22 ENCOUNTER — Encounter: Payer: Self-pay | Admitting: Nurse Practitioner

## 2021-01-22 ENCOUNTER — Ambulatory Visit (INDEPENDENT_AMBULATORY_CARE_PROVIDER_SITE_OTHER): Payer: 59 | Admitting: Nurse Practitioner

## 2021-01-22 ENCOUNTER — Other Ambulatory Visit: Payer: Self-pay

## 2021-01-22 VITALS — BP 126/80 | HR 77 | Temp 98.4°F | Ht 62.4 in | Wt 206.4 lb

## 2021-01-22 DIAGNOSIS — R21 Rash and other nonspecific skin eruption: Secondary | ICD-10-CM

## 2021-01-22 MED ORDER — PREDNISONE 10 MG PO TABS: 10.0000 mg | ORAL_TABLET | Freq: Every day | ORAL | 0 refills | Status: DC

## 2021-01-22 NOTE — Progress Notes (Signed)
This visit occurred during the SARS-CoV-2 public health emergency.  Safety protocols were in place, including screening questions prior to the visit, additional usage of staff PPE, and extensive cleaning of exam room while observing appropriate contact time as indicated for disinfecting solutions.  Subjective:     Patient ID: Emily Cochran , female    DOB: 1961/04/22 , 60 y.o.   MRN: [id number]   Chief Complaint  Patient presents with   Rash    HPI  Rash on her arm and leg.  Changed her detergent recently. Denies shortness of breath or chest pain.   Rash This is a new problem. The current episode started in the past 7 days. The problem has been gradually worsening since onset. The affected locations include the right lower leg, right upper leg, left lower leg, right ankle and right arm. The rash is characterized by itchiness and redness. She was exposed to nothing. Pertinent negatives include no congestion, cough, fatigue or sore throat. Past treatments include nothing.    Past Medical History:  Diagnosis Date   Asthma    Bipolar 1 disorder (HCC)    Hypertension    Kidney stones    Type 2 diabetes mellitus without complication, without long-term current use of insulin (HCC) 02/29/2020     Family History  Problem Relation Age of Onset   Bipolar disorder Mother    Schizophrenia Mother    Diabetes Mother    Cancer Mother    Hypertension Mother    Cancer Father    Diabetes Father      Current Outpatient Medications:    ACCU-CHEK GUIDE test strip, CHECK BLOOD SUGAR UP TO 4 TIMES DAILY, Disp: , Rfl: 0   ACCU-CHEK SOFTCLIX LANCETS lancets, CHECK BLOOD SUGAR UP TO 4 TIMES DAILY, Disp: 300 each, Rfl: 1   albuterol (VENTOLIN HFA) 108 (90 Base) MCG/ACT inhaler, INHALE 2 PUFFS INTO THE LUNGS EVERY 6 HOURS AS NEEDED FOR WHEEZING OR SHORTNESS OF BREATH, Disp: 54 g, Rfl: 1   amLODipine (NORVASC) 5 MG tablet, Take 1 tablet (5 mg total) by mouth daily., Disp: 90 tablet, Rfl: 1    Diphenhydramine-APAP, sleep, (GOODY PM PO), Take 1 Package by mouth daily as needed (pain). , Disp: , Rfl:    fluticasone (FLONASE) 50 MCG/ACT nasal spray, Place 2 sprays into both nostrils daily., Disp: 16 g, Rfl: 12   metoprolol tartrate (LOPRESSOR) 25 MG tablet, TAKE 1 TABLET(25 MG) BY MOUTH TWICE DAILY, Disp: 180 tablet, Rfl: 1   pravastatin (PRAVACHOL) 20 MG tablet, TAKE 1 TABLET BY MOUTH MONDAY WEDNESDAY AND FRIDAY, Disp: 45 tablet, Rfl: 1   predniSONE (DELTASONE) 10 MG tablet, Take 1 tablet (10 mg total) by mouth daily with breakfast., Disp: 6 tablet, Rfl: 0   pregabalin (LYRICA) 75 MG capsule, Take 1 capsule (75 mg total) by mouth daily., Disp: 30 capsule, Rfl: 5   Semaglutide,0.25 or 0.5MG /DOS, (OZEMPIC, 0.25 OR 0.5 MG/DOSE,) 2 MG/1.5ML SOPN, Inject 0.5 mg into the skin once a week., Disp: 4.5 mL, Rfl: 1   tiZANidine (ZANAFLEX) 4 MG tablet, TAKE 1 TABLET(4 MG) BY MOUTH EVERY 6 HOURS AS NEEDED FOR MUSCLE SPASMS, Disp: 30 tablet, Rfl: 0   Allergies  Allergen Reactions   Coconut Flavor Shortness Of Breath and Swelling   Shellfish Allergy Shortness Of Breath and Swelling    She can eat shrimp   Ultram [Tramadol] Anaphylaxis    Pt REPORTS THROAT SWELLING   Aspirin Swelling and Other (See Comments)    Angioedema,  cuts on lips     Review of Systems  Constitutional:  Negative for chills and fatigue.  HENT:  Negative for congestion and sore throat.   Respiratory:  Negative for cough.   Cardiovascular:  Negative for chest pain and palpitations.  Skin:  Positive for rash.  Neurological:  Negative for dizziness and headaches.    Today's Vitals   01/22/21 1050  BP: 126/80  Pulse: 77  Temp: 98.4 F (36.9 C)  Weight: 206 lb 6.4 oz (93.6 kg)  Height: 5' 2.4" (1.585 m)  PainSc: 0-No pain   Body mass index is 37.27 kg/m.   Objective:  Physical Exam Constitutional:      Appearance: Normal appearance.  HENT:     Head: Normocephalic and atraumatic.  Cardiovascular:     Rate and  Rhythm: Normal rate and regular rhythm.     Pulses: Normal pulses.     Heart sounds: Normal heart sounds. No murmur heard. Pulmonary:     Effort: Pulmonary effort is normal. No respiratory distress.     Breath sounds: Normal breath sounds.  Skin:    General: Skin is warm and dry.     Capillary Refill: Capillary refill takes less than 2 seconds.     Findings: Rash present. Rash is papular.     Comments: Rash to legs and arms         Assessment And Plan:     1. Rash and nonspecific skin eruption -pt recently changed cloth detergent.  -advised patient she can use benadryl and Pepcid OTC as needed  -OTC hydrocortisone anti-itch cream  - predniSONE (DELTASONE) 10 MG tablet; Take 1 tablet (10 mg total) by mouth daily with breakfast.  Dispense: 6 tablet; Refill: 0   Follow up: if symptoms persist or do not get better.   The patient was encouraged to call or send a message through MyChart for any questions or concerns.    Side effects and appropriate use of all the medication(s) were discussed with the patient today. Patient advised to use the medication(s) as directed by their healthcare provider. The patient was encouraged to read, review, and understand all associated package inserts and contact our office with any questions or concerns. The patient accepts the risks of the treatment plan and had an opportunity to ask questions.   Patient was given opportunity to ask questions. Patient verbalized understanding of the plan and was able to repeat key elements of the plan. All questions were answered to their satisfaction.  Raman Idalie Canto, DNP   I, Raman Maiana Hennigan have reviewed all documentation for this visit. The documentation on 01/22/21 for the exam, diagnosis, procedures, and orders are all accurate and complete.    IF YOU HAVE BEEN REFERRED TO A SPECIALIST, IT MAY TAKE 1-2 WEEKS TO SCHEDULE/PROCESS THE REFERRAL. IF YOU HAVE NOT HEARD FROM US/SPECIALIST IN TWO WEEKS, PLEASE GIVE [place] A CALL  AT [phone].   THE PATIENT IS ENCOURAGED TO PRACTICE SOCIAL DISTANCING DUE TO THE COVID-19 PANDEMIC.

## 2021-01-29 ENCOUNTER — Encounter: Payer: Medicare Other | Admitting: Nurse Practitioner

## 2021-03-12 ENCOUNTER — Ambulatory Visit: Payer: Medicaid Other | Admitting: Nurse Practitioner

## 2021-04-09 ENCOUNTER — Ambulatory Visit: Payer: Medicaid Other | Admitting: Nurse Practitioner

## 2021-08-26 ENCOUNTER — Other Ambulatory Visit: Payer: Self-pay

## 2021-08-26 DIAGNOSIS — I10 Essential (primary) hypertension: Secondary | ICD-10-CM

## 2021-08-26 MED ORDER — AMLODIPINE BESYLATE 5 MG PO TABS: 5.0000 mg | ORAL_TABLET | Freq: Every day | ORAL | 1 refills | Status: DC

## 2021-08-27 ENCOUNTER — Other Ambulatory Visit: Payer: Self-pay

## 2021-08-27 ENCOUNTER — Telehealth: Payer: Self-pay

## 2021-08-27 DIAGNOSIS — E119 Type 2 diabetes mellitus without complications: Secondary | ICD-10-CM

## 2021-08-27 DIAGNOSIS — I10 Essential (primary) hypertension: Secondary | ICD-10-CM

## 2021-08-27 DIAGNOSIS — R946 Abnormal results of thyroid function studies: Secondary | ICD-10-CM

## 2021-08-27 MED ORDER — METOPROLOL TARTRATE 25 MG PO TABS
ORAL_TABLET | ORAL | 0 refills | Status: DC
Start: 2021-08-27 — End: 2021-10-05

## 2021-08-27 MED ORDER — OZEMPIC (0.25 OR 0.5 MG/DOSE) 2 MG/1.5ML ~~LOC~~ SOPN: 0.5000 mg | PEN_INJECTOR | SUBCUTANEOUS | 0 refills | Status: DC

## 2021-08-27 MED ORDER — PRAVASTATIN SODIUM 20 MG PO TABS: ORAL_TABLET | ORAL | 0 refills | Status: DC

## 2021-08-27 MED ORDER — AMLODIPINE BESYLATE 5 MG PO TABS: 5.0000 mg | ORAL_TABLET | Freq: Every day | ORAL | 0 refills | Status: DC

## 2021-08-27 NOTE — Telephone Encounter (Signed)
Pt called for refills. She has not been seen since 01/2021. Informed pt that we could only send 30 day supply. Pt must make appt and keep appt before rcv any more refills  ?

## 2021-09-15 ENCOUNTER — Telehealth: Payer: Self-pay

## 2021-09-15 ENCOUNTER — Other Ambulatory Visit: Payer: Self-pay | Admitting: Nurse Practitioner

## 2021-09-15 DIAGNOSIS — M797 Fibromyalgia: Secondary | ICD-10-CM

## 2021-09-15 MED ORDER — PREGABALIN 75 MG PO CAPS: 75.0000 mg | ORAL_CAPSULE | Freq: Every day | ORAL | 0 refills | Status: DC

## 2021-09-15 NOTE — Telephone Encounter (Signed)
The pt wanted to schedule an appt for next week so she can get a refill on her Lyrica.  The pt was told that she has an appt March 10 for f/u and annual wellness and that she must keep her upcoming appt.  ?

## 2021-10-04 ENCOUNTER — Other Ambulatory Visit: Payer: Self-pay | Admitting: Nurse Practitioner

## 2021-10-04 DIAGNOSIS — R946 Abnormal results of thyroid function studies: Secondary | ICD-10-CM

## 2021-10-09 ENCOUNTER — Other Ambulatory Visit: Payer: Self-pay | Admitting: Nurse Practitioner

## 2021-10-09 DIAGNOSIS — E119 Type 2 diabetes mellitus without complications: Secondary | ICD-10-CM

## 2021-10-12 ENCOUNTER — Other Ambulatory Visit: Payer: Self-pay

## 2021-10-12 DIAGNOSIS — E119 Type 2 diabetes mellitus without complications: Secondary | ICD-10-CM

## 2021-10-12 MED ORDER — PRAVASTATIN SODIUM 20 MG PO TABS: ORAL_TABLET | ORAL | 0 refills | Status: DC

## 2021-10-13 ENCOUNTER — Other Ambulatory Visit: Payer: Self-pay

## 2021-10-13 DIAGNOSIS — E119 Type 2 diabetes mellitus without complications: Secondary | ICD-10-CM

## 2021-10-14 ENCOUNTER — Ambulatory Visit (INDEPENDENT_AMBULATORY_CARE_PROVIDER_SITE_OTHER): Payer: 59

## 2021-10-14 ENCOUNTER — Telehealth: Payer: Self-pay | Admitting: *Deleted

## 2021-10-14 ENCOUNTER — Ambulatory Visit (INDEPENDENT_AMBULATORY_CARE_PROVIDER_SITE_OTHER): Payer: 59 | Admitting: Nurse Practitioner

## 2021-10-14 ENCOUNTER — Encounter: Payer: Self-pay | Admitting: Nurse Practitioner

## 2021-10-14 VITALS — BP 124/70 | HR 75 | Temp 97.9°F | Ht 63.2 in | Wt 216.2 lb

## 2021-10-14 VITALS — BP 124/70 | HR 75 | Temp 97.9°F | Ht 63.6 in | Wt 216.0 lb

## 2021-10-14 DIAGNOSIS — G8929 Other chronic pain: Secondary | ICD-10-CM | POA: Diagnosis not present

## 2021-10-14 DIAGNOSIS — M797 Fibromyalgia: Secondary | ICD-10-CM | POA: Diagnosis not present

## 2021-10-14 DIAGNOSIS — Z0000 Encounter for general adult medical examination without abnormal findings: Secondary | ICD-10-CM

## 2021-10-14 DIAGNOSIS — E1169 Type 2 diabetes mellitus with other specified complication: Secondary | ICD-10-CM | POA: Diagnosis not present

## 2021-10-14 DIAGNOSIS — M25562 Pain in left knee: Secondary | ICD-10-CM

## 2021-10-14 DIAGNOSIS — F209 Schizophrenia, unspecified: Secondary | ICD-10-CM

## 2021-10-14 DIAGNOSIS — M25561 Pain in right knee: Secondary | ICD-10-CM | POA: Diagnosis not present

## 2021-10-14 DIAGNOSIS — Z6837 Body mass index (BMI) 37.0-37.9, adult: Secondary | ICD-10-CM

## 2021-10-14 DIAGNOSIS — E119 Type 2 diabetes mellitus without complications: Secondary | ICD-10-CM

## 2021-10-14 DIAGNOSIS — E669 Obesity, unspecified: Secondary | ICD-10-CM

## 2021-10-14 DIAGNOSIS — I10 Essential (primary) hypertension: Secondary | ICD-10-CM | POA: Diagnosis not present

## 2021-10-14 DIAGNOSIS — E6609 Other obesity due to excess calories: Secondary | ICD-10-CM

## 2021-10-14 DIAGNOSIS — Z1231 Encounter for screening mammogram for malignant neoplasm of breast: Secondary | ICD-10-CM

## 2021-10-14 MED ORDER — TRIAMCINOLONE ACETONIDE 40 MG/ML IJ SUSP
40.0000 mg | Freq: Once | INTRAMUSCULAR | Status: AC
  Administered 2021-10-14: 40 mg via INTRAMUSCULAR

## 2021-10-14 NOTE — Addendum Note (Signed)
Addended by: Elisha Ponder E on: 10/14/2021 11:00 AM ? ? Modules accepted: Orders ? ?
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
## 2021-10-14 NOTE — Patient Instructions (Signed)
Hypertension, Adult High blood pressure (hypertension) is when the force of blood pumping through the arteries is too strong. The arteries are the blood vessels that carry blood from the heart throughout the body. Hypertension forces the heart to work harder to pump blood and may cause arteries to become narrow or stiff. Untreated or uncontrolled hypertension can lead to a heart attack, heart failure, a stroke, kidney disease, and other problems. A blood pressure reading consists of a higher number over a lower number. Ideally, your blood pressure should be below 120/80. The first ("top") number is called the systolic pressure. It is a measure of the pressure in your arteries as your heart beats. The second ("bottom") number is called the diastolic pressure. It is a measure of the pressure in your arteries as the heart relaxes. What are the causes? The exact cause of this condition is not known. There are some conditions that result in high blood pressure. What increases the risk? Certain factors may make you more likely to develop high blood pressure. Some of these risk factors are under your control, including: Smoking. Not getting enough exercise or physical activity. Being overweight. Having too much fat, sugar, calories, or salt (sodium) in your diet. Drinking too much alcohol. Other risk factors include: Having a personal history of heart disease, diabetes, high cholesterol, or kidney disease. Stress. Having a family history of high blood pressure and high cholesterol. Having obstructive sleep apnea. Age. The risk increases with age. What are the signs or symptoms? High blood pressure may not cause symptoms. Very high blood pressure (hypertensive crisis) may cause: Headache. Fast or irregular heartbeats (palpitations). Shortness of breath. Nosebleed. Nausea and vomiting. Vision changes. Severe chest pain, dizziness, and seizures. How is this diagnosed? This condition is diagnosed by  measuring your blood pressure while you are seated, with your arm resting on a flat surface, your legs uncrossed, and your feet flat on the floor. The cuff of the blood pressure monitor will be placed directly against the skin of your upper arm at the level of your heart. Blood pressure should be measured at least twice using the same arm. Certain conditions can cause a difference in blood pressure between your right and left arms. If you have a high blood pressure reading during one visit or you have normal blood pressure with other risk factors, you may be asked to: Return on a different day to have your blood pressure checked again. Monitor your blood pressure at home for 1 week or longer. If you are diagnosed with hypertension, you may have other blood or imaging tests to help your health care provider understand your overall risk for other conditions. How is this treated? This condition is treated by making healthy lifestyle changes, such as eating healthy foods, exercising more, and reducing your alcohol intake. You may be referred for counseling on a healthy diet and physical activity. Your health care provider may prescribe medicine if lifestyle changes are not enough to get your blood pressure under control and if: Your systolic blood pressure is above 130. Your diastolic blood pressure is above 80. Your personal target blood pressure may vary depending on your medical conditions, your age, and other factors. Follow these instructions at home: Eating and drinking  Eat a diet that is high in fiber and potassium, and low in sodium, added sugar, and fat. An example of this eating plan is called the DASH diet. DASH stands for Dietary Approaches to Stop Hypertension. To eat this way: Eat   plenty of fresh fruits and vegetables. Try to fill one half of your plate at each meal with fruits and vegetables. Eat whole grains, such as whole-wheat pasta, brown rice, or whole-grain bread. Fill about one  fourth of your plate with whole grains. Eat or drink low-fat dairy products, such as skim milk or low-fat yogurt. Avoid fatty cuts of meat, processed or cured meats, and poultry with skin. Fill about one fourth of your plate with lean proteins, such as fish, chicken without skin, beans, eggs, or tofu. Avoid pre-made and processed foods. These tend to be higher in sodium, added sugar, and fat. Reduce your daily sodium intake. Many people with hypertension should eat less than 1,500 mg of sodium a day. Do not drink alcohol if: Your health care provider tells you not to drink. You are pregnant, may be pregnant, or are planning to become pregnant. If you drink alcohol: Limit how much you have to: 0-1 drink a day for women. 0-2 drinks a day for men. Know how much alcohol is in your drink. In the [place] one drink equals one 12 oz bottle of beer (355 mL), one 5 oz glass of wine (148 mL), or one 1 oz glass of hard liquor (44 mL). Lifestyle  Work with your health care provider to maintain a healthy body weight or to lose weight. Ask what an ideal weight is for you. Get at least 30 minutes of exercise that causes your heart to beat faster (aerobic exercise) most days of the week. Activities may include walking, swimming, or biking. Include exercise to strengthen your muscles (resistance exercise), such as Pilates or lifting weights, as part of your weekly exercise routine. Try to do these types of exercises for 30 minutes at least 3 days a week. Do not use any products that contain nicotine or tobacco. These products include cigarettes, chewing tobacco, and vaping devices, such as e-cigarettes. If you need help quitting, ask your health care provider. Monitor your blood pressure at home as told by your health care provider. Keep all follow-up visits. This is important. Medicines Take over-the-counter and prescription medicines only as told by your health care provider. Follow directions carefully. Blood  pressure medicines must be taken as prescribed. Do not skip doses of blood pressure medicine. Doing this puts you at risk for problems and can make the medicine less effective. Ask your health care provider about side effects or reactions to medicines that you should watch for. Contact a health care provider if you: Think you are having a reaction to a medicine you are taking. Have headaches that keep coming back (recurring). Feel dizzy. Have swelling in your ankles. Have trouble with your vision. Get help right away if you: Develop a severe headache or confusion. Have unusual weakness or numbness. Feel faint. Have severe pain in your chest or abdomen. Vomit repeatedly. Have trouble breathing. These symptoms may be an emergency. Get help right away. Call 911. Do not wait to see if the symptoms will go away. Do not drive yourself to the hospital. Summary Hypertension is when the force of blood pumping through your arteries is too strong. If this condition is not controlled, it may put you at risk for serious complications. Your personal target blood pressure may vary depending on your medical conditions, your age, and other factors. For most people, a normal blood pressure is less than 120/80. Hypertension is treated with lifestyle changes, medicines, or a combination of both. Lifestyle changes include losing weight, eating a healthy,   low-sodium diet, exercising more, and limiting alcohol. This information is not intended to replace advice given to you by your health care provider. Make sure you discuss any questions you have with your health care provider. Document Revised: 03/31/2021 Document Reviewed: 03/31/2021 [employer] Patient Education  2023 [employer]  

## 2021-10-14 NOTE — Progress Notes (Signed)
? ?Subjective:  ? Emily Cochran is a 61 y.o. female who presents for Medicare Annual (Subsequent) preventive examination. ? ?Review of Systems    ? ?Cardiac Risk Factors include: diabetes mellitus;hypertension;obesity (BMI >30kg/m2) ? ?   ?Objective:  ?  ?Today's Vitals  ? 10/14/21 1018 10/14/21 1025  ?BP: 124/70   ?Pulse: 75   ?Temp: 97.9 ?F (36.6 ?C)   ?TempSrc: Oral   ?SpO2: 99%   ?Weight: 216 lb 3.2 oz (98.1 kg)   ?Height: 5' 3.2" (1.605 m)   ?PainSc:  10-Worst pain ever  ? ?Body mass index is 38.06 kg/m?. ? ? ?  10/14/2021  ? 10:34 AM 10/09/2020  ? 12:24 PM 05/29/2019  ? 11:16 AM 04/17/2018  ?  3:10 AM 03/16/2018  ?  4:04 PM 11/08/2016  ?  4:56 PM 08/27/2016  ? 12:29 PM  ?Advanced Directives  ?Does Patient Have a Medical Advance Directive? No No No No No No No  ?Would patient like information on creating a medical advance directive? Yes (MAU/Ambulatory/Procedural Areas - Information given) Yes (MAU/Ambulatory/Procedural Areas - Information given)  No - Patient declined  No - Patient declined No - Patient declined  ? ? ?Current Medications (verified) ?Outpatient Encounter Medications as of 10/14/2021  ?Medication Sig  ? ACCU-CHEK GUIDE test strip CHECK BLOOD SUGAR UP TO 4 TIMES DAILY  ? ACCU-CHEK SOFTCLIX LANCETS lancets CHECK BLOOD SUGAR UP TO 4 TIMES DAILY  ? albuterol (VENTOLIN HFA) 108 (90 Base) MCG/ACT inhaler INHALE 2 PUFFS INTO THE LUNGS EVERY 6 HOURS AS NEEDED FOR WHEEZING OR SHORTNESS OF BREATH  ? amLODipine (NORVASC) 5 MG tablet Take 1 tablet (5 mg total) by mouth daily. Pt must have appt for further refills  ? Diphenhydramine-APAP, sleep, (GOODY PM PO) Take 1 Package by mouth daily as needed (pain).   ? fluticasone (FLONASE) 50 MCG/ACT nasal spray Place 2 sprays into both nostrils daily.  ? metoprolol tartrate (LOPRESSOR) 25 MG tablet TAKE 1 TABLET(25 MG) BY MOUTH TWICE DAILY  ? pravastatin (PRAVACHOL) 20 MG tablet TAKE 1 TABLET BY MOUTH MONDAY [facility] AND Friday. Pt must have appt for further refills   ? predniSONE (DELTASONE) 10 MG tablet Take 1 tablet (10 mg total) by mouth daily with breakfast.  ? pregabalin (LYRICA) 75 MG capsule Take 1 capsule (75 mg total) by mouth daily. MUST COME TO APPT IN MAY BEFORE ANY FURTHER REFILLS  ? Semaglutide,0.25 or 0.5MG /DOS, (OZEMPIC, 0.25 OR 0.5 MG/DOSE,) 2 MG/1.5ML SOPN Inject 0.5 mg into the skin once a week. Pt must have appt for further refills  ? tiZANidine (ZANAFLEX) 4 MG tablet TAKE 1 TABLET(4 MG) BY MOUTH EVERY 6 HOURS AS NEEDED FOR MUSCLE SPASMS  ? ?No facility-administered encounter medications on file as of 10/14/2021.  ? ? ?Allergies (verified) ?Coconut flavor, Shellfish allergy, Ultram [tramadol], and Aspirin  ? ?History: ?Past Medical History:  ?Diagnosis Date  ? Asthma   ? Bipolar 1 disorder ([place])   ? Hypertension   ? Kidney stones   ? Type 2 diabetes mellitus without complication, without long-term current use of insulin ([place]) 02/29/2020  ? ?Past Surgical History:  ?Procedure Laterality Date  ? ABDOMINAL HYSTERECTOMY    ? partial   ? APPENDECTOMY    ? CHOLECYSTECTOMY    ? ?Family History  ?Problem Relation Age of Onset  ? Bipolar disorder Mother   ? Schizophrenia Mother   ? Diabetes Mother   ? Cancer Mother   ? Hypertension Mother   ? Cancer Father   ?  Diabetes Father   ? ?Social History  ? ?Socioeconomic History  ? Marital status: Single  ?  Spouse name: Not on file  ? Number of children: Not on file  ? Years of education: Not on file  ? Highest education level: Not on file  ?Occupational History  ? Occupation: disability  ?Tobacco Use  ? Smoking status: Never  ? Smokeless tobacco: Never  ?Vaping Use  ? Vaping Use: Never used  ?Substance and Sexual Activity  ? Alcohol use: No  ? Drug use: No  ? Sexual activity: Not Currently  ?  Birth control/protection: Surgical  ?Other Topics Concern  ? Not on file  ?Social History Narrative  ? Not on file  ? ?Social Determinants of Health  ? ?Financial Resource Strain: High Risk  ? Difficulty of Paying Living Expenses: Very  hard  ?Food Insecurity: Food Insecurity Present  ? Worried About Charity fundraiser in the Last Year: Often true  ? Ran Out of Food in the Last Year: Often true  ?Transportation Needs: Unmet Transportation Needs  ? Lack of Transportation (Medical): Yes  ? Lack of Transportation (Non-Medical): Yes  ?Physical Activity: Sufficiently Active  ? Days of Exercise per Week: 7 days  ? Minutes of Exercise per Session: 90 min  ?Stress: Stress Concern Present  ? Feeling of Stress : To some extent  ?Social Connections: Not on file  ? ? ?Tobacco Counseling ?Counseling given: Not Answered ? ? ?Clinical Intake: ? ?Pre-visit preparation completed: Yes ? ?Pain : 0-10 ?Pain Score: 10-Worst pain ever ?Pain Location: Knee ?Pain Orientation: Left, Right ?Pain Descriptors / Indicators: Aching, Throbbing ?Pain Onset: More than a month ago ?Pain Frequency: Constant ? ?  ? ?Nutritional Status: BMI > 30  Obese ?Nutritional Risks: None ?Diabetes: Yes ? ?How often do you need to have someone help you when you read instructions, pamphlets, or other written materials from your doctor or pharmacy?: 1 - Never ?What is the last grade level you completed in school?: 12th grade ? ?Diabetic? Yes ?Nutrition Risk Assessment: ? ?Has the patient had any N/V/D within the last 2 months?  No  ?Does the patient have any non-healing wounds?  No  ?Has the patient had any unintentional weight loss or weight gain?  No  ? ?Diabetes: ? ?Is the patient diabetic?  Yes  ?If diabetic, was a CBG obtained today?  No  ?Did the patient bring in their glucometer from home?  No  ?How often do you monitor your CBG's? Every other day.  ? ?Financial Strains and Diabetes Management: ? ?Are you having any financial strains with the device, your supplies or your medication? No .  ?Does the patient want to be seen by Chronic Care Management for management of their diabetes?  No  ?Would the patient like to be referred to a Nutritionist or for Diabetic Management?  No  ? ?Diabetic  Exams: ? ?Diabetic Eye Exam: Completed 11/07/2020 ?Diabetic Foot Exam: Overdue, Pt has been advised about the importance in completing this exam. Pt is scheduled for diabetic foot exam on next appointment. ? ? ?Interpreter Needed?: No ? ?Information entered by :: NAllen LPN ? ? ?Activities of Daily Living ? ?  10/14/2021  ? 10:36 AM  ?In your present state of health, do you have any difficulty performing the following activities:  ?Hearing? 0  ?Vision? 0  ?Difficulty concentrating or making decisions? 0  ?Walking or climbing stairs? 1  ?Dressing or bathing? 0  ?Doing errands, shopping? 0  ?  Preparing Food and eating ? N  ?Using the Toilet? N  ?In the past six months, have you accidently leaked urine? Y  ?Do you have problems with loss of bowel control? N  ?Managing your Medications? N  ?Managing your Finances? N  ?Housekeeping or managing your Housekeeping? N  ? ? ?Patient Care Team: ?[name], FNP as PCP - General (General Practice) ?Little, Claudette Stapler, RN as Case Manager ? ?Indicate any recent Medical Services you may have received from other than [facility] providers in the past year (date may be approximate). ? ?   ?Assessment:  ? This is a routine wellness examination for Emily Cochran. ? ?Hearing/Vision screen ?Vision Screening - Comments:: Regular eye exams, Dr. Ellsworth Lennox ? ?Dietary issues and exercise activities discussed: ?Current Exercise Habits: Home exercise routine, Type of exercise: walking, Time (Minutes): > 60, Frequency (Times/Week): 7, Weekly Exercise (Minutes/Week): 0 ? ? Goals Addressed   ? ?  ?  ?  ?  ? This Visit's Progress  ?  Patient Stated     ?  10/14/2021, keep all labs normal and lose weight ?  ? ?  ? ?Depression Screen ? ?  10/14/2021  ? 10:36 AM 10/09/2020  ? 12:26 PM 05/29/2019  ? 11:17 AM 05/24/2019  ? 11:34 AM 11/22/2018  ?  2:59 PM 09/19/2018  ?  2:39 PM 04/19/2018  ? 10:44 AM  ?PHQ 2/9 Scores  ?PHQ - 2 Score 0 0 0 0 0 0 1  ?PHQ- 9 Score   0    1  ?  ?Fall Risk ? ?  10/14/2021  ? 10:35 AM 10/09/2020  ? 12:26  PM 05/29/2019  ? 11:17 AM 05/24/2019  ? 11:34 AM 11/22/2018  ?  2:59 PM  ?Fall Risk   ?Falls in the past year? 1 0 0 0 0  ?Comment knee and hip give out      ?Number falls in past yr: 1      ?Injury with

## 2021-10-14 NOTE — Telephone Encounter (Signed)
? ?  Telephone encounter was:  Unsuccessful.  10/14/2021 ?Name: Emily Cochran MRN: [id number] DOB: 1960-06-29 ? ?Unsuccessful outbound call made today to assist with:  Food Insecurity ? ?Outreach Attempt:  1st Attempt ? ?A HIPAA compliant voice message was left requesting a return call.  Instructed patient to call back at   Instructed patient to call back at [phone]  at their earliest convenience. . ? ?Emily Cochran ?Care Guide , Embedded Care Coordination ?[place], Care Management  ?[phone] ?[address] , [place] [address] ?Email : [email or web address] ?  ?

## 2021-10-14 NOTE — Patient Instructions (Signed)
Ms. Kendra , ?Thank you for taking time to come for your Medicare Wellness Visit. I appreciate your ongoing commitment to your health goals. Please review the following plan we discussed and let me know if I can assist you in the future.  ? ?Screening recommendations/referrals: ?Colonoscopy: completed 01/24/2020, due 01/23/2030 ?Mammogram: patient to schedule ?Bone Density:  n/a ?Recommended yearly ophthalmology/optometry visit for glaucoma screening and checkup ?Recommended yearly dental visit for hygiene and checkup ? ?Vaccinations: ?Influenza vaccine: due 01/05/2022 ?Pneumococcal vaccine: n/a ?Tdap vaccine: due ?Shingles vaccine:  due  ?Covid-19:  09/27/2019, 08/30/2019 ? ?Advanced directives: Advance directive discussed with you today. I have provided a copy for you to complete at home and have notarized. Once this is complete please bring a copy in to our office so we can scan it into your chart. ? ?Conditions/risks identified: none ? ?Next appointment: Follow up in one year for your annual wellness visit.  ? ?Preventive Care 40-64 Years, Female ?Preventive care refers to lifestyle choices and visits with your health care provider that can promote health and wellness. ?What does preventive care include? ?A yearly physical exam. This is also called an annual well check. ?Dental exams once or twice a year. ?Routine eye exams. Ask your health care provider how often you should have your eyes checked. ?Personal lifestyle choices, including: ?Daily care of your teeth and gums. ?Regular physical activity. ?Eating a healthy diet. ?Avoiding tobacco and drug use. ?Limiting alcohol use. ?Practicing safe sex. ?Taking low-dose aspirin daily starting at age 33. ?Taking vitamin and mineral supplements as recommended by your health care provider. ?What happens during an annual well check? ?The services and screenings done by your health care provider during your annual well check will depend on your age, overall health, lifestyle  risk factors, and family history of disease. ?Counseling  ?Your health care provider may ask you questions about your: ?Alcohol use. ?Tobacco use. ?Drug use. ?Emotional well-being. ?Home and relationship well-being. ?Sexual activity. ?Eating habits. ?Work and work Statistician. ?Method of birth control. ?Menstrual cycle. ?Pregnancy history. ?Screening  ?You may have the following tests or measurements: ?Height, weight, and BMI. ?Blood pressure. ?Lipid and cholesterol levels. These may be checked every 5 years, or more frequently if you are over 90 years old. ?Skin check. ?Lung cancer screening. You may have this screening every year starting at age 45 if you have a 30-pack-year history of smoking and currently smoke or have quit within the past 15 years. ?Fecal occult blood test (FOBT) of the stool. You may have this test every year starting at age 74. ?Flexible sigmoidoscopy or colonoscopy. You may have a sigmoidoscopy every 5 years or a colonoscopy every 10 years starting at age 29. ?Hepatitis C blood test. ?Hepatitis B blood test. ?Sexually transmitted disease (STD) testing. ?Diabetes screening. This is done by checking your blood sugar (glucose) after you have not eaten for a while (fasting). You may have this done every 1-3 years. ?Mammogram. This may be done every 1-2 years. Talk to your health care provider about when you should start having regular mammograms. This may depend on whether you have a family history of breast cancer. ?BRCA-related cancer screening. This may be done if you have a family history of breast, ovarian, tubal, or peritoneal cancers. ?Pelvic exam and Pap test. This may be done every 3 years starting at age 67. Starting at age 75, this may be done every 5 years if you have a Pap test in combination with an HPV test. ?Bone  density scan. This is done to screen for osteoporosis. You may have this scan if you are at high risk for osteoporosis. ?Discuss your test results, treatment options,  and if necessary, the need for more tests with your health care provider. ?Vaccines  ?Your health care provider may recommend certain vaccines, such as: ?Influenza vaccine. This is recommended every year. ?Tetanus, diphtheria, and acellular pertussis (Tdap, Td) vaccine. You may need a Td booster every 10 years. ?Zoster vaccine. You may need this after age 51. ?Pneumococcal 13-valent conjugate (PCV13) vaccine. You may need this if you have certain conditions and were not previously vaccinated. ?Pneumococcal polysaccharide (PPSV23) vaccine. You may need one or two doses if you smoke cigarettes or if you have certain conditions. ?Talk to your health care provider about which screenings and vaccines you need and how often you need them. ?This information is not intended to replace advice given to you by your health care provider. Make sure you discuss any questions you have with your health care provider. ?Document Released: 06/20/2015 Document Revised: 02/11/2016 Document Reviewed: 03/25/2015 ?Elsevier Interactive Patient Education ? 2017 Elsevier Inc. ? ? ? ?Fall Prevention in the Home ?Falls can cause injuries. They can happen to people of all ages. There are many things you can do to make your home safe and to help prevent falls. ?What can I do on the outside of my home? ?Regularly fix the edges of walkways and driveways and fix any cracks. ?Remove anything that might make you trip as you walk through a door, such as a raised step or threshold. ?Trim any bushes or trees on the path to your home. ?Use bright outdoor lighting. ?Clear any walking paths of anything that might make someone trip, such as rocks or tools. ?Regularly check to see if handrails are loose or broken. Make sure that both sides of any steps have handrails. ?Any raised decks and porches should have guardrails on the edges. ?Have any leaves, snow, or ice cleared regularly. ?Use sand or salt on walking paths during winter. ?Clean up any spills in  your garage right away. This includes oil or grease spills. ?What can I do in the bathroom? ?Use night lights. ?Install grab bars by the toilet and in the tub and shower. Do not use towel bars as grab bars. ?Use non-skid mats or decals in the tub or shower. ?If you need to sit down in the shower, use a plastic, non-slip stool. ?Keep the floor dry. Clean up any water that spills on the floor as soon as it happens. ?Remove soap buildup in the tub or shower regularly. ?Attach bath mats securely with double-sided non-slip rug tape. ?Do not have throw rugs and other things on the floor that can make you trip. ?What can I do in the bedroom? ?Use night lights. ?Make sure that you have a light by your bed that is easy to reach. ?Do not use any sheets or blankets that are too big for your bed. They should not hang down onto the floor. ?Have a firm chair that has side arms. You can use this for support while you get dressed. ?Do not have throw rugs and other things on the floor that can make you trip. ?What can I do in the kitchen? ?Clean up any spills right away. ?Avoid walking on wet floors. ?Keep items that you use a lot in easy-to-reach places. ?If you need to reach something above you, use a strong step stool that has a grab bar. ?Keep  electrical cords out of the way. ?Do not use floor polish or wax that makes floors slippery. If you must use wax, use non-skid floor wax. ?Do not have throw rugs and other things on the floor that can make you trip. ?What can I do with my stairs? ?Do not leave any items on the stairs. ?Make sure that there are handrails on both sides of the stairs and use them. Fix handrails that are broken or loose. Make sure that handrails are as long as the stairways. ?Check any carpeting to make sure that it is firmly attached to the stairs. Fix any carpet that is loose or worn. ?Avoid having throw rugs at the top or bottom of the stairs. If you do have throw rugs, attach them to the floor with carpet  tape. ?Make sure that you have a light switch at the top of the stairs and the bottom of the stairs. If you do not have them, ask someone to add them for you. ?What else can I do to help prevent falls? ?We

## 2021-10-15 ENCOUNTER — Telehealth: Payer: Self-pay | Admitting: *Deleted

## 2021-10-15 LAB — RENAL FUNCTION PANEL
Albumin: 3.7 g/dL — ABNORMAL LOW (ref 3.8–4.9)
BUN/Creatinine Ratio: 29 — ABNORMAL HIGH (ref 12–28)
BUN: 20 mg/dL (ref 8–27)
CO2: 25 mmol/L (ref 20–29)
Calcium: 9 mg/dL (ref 8.7–10.3)
Chloride: 102 mmol/L (ref 96–106)
Creatinine, Ser: 0.69 mg/dL (ref 0.57–1.00)
Glucose: 88 mg/dL (ref 70–99)
Phosphorus: 3.1 mg/dL (ref 3.0–4.3)
Potassium: 3.8 mmol/L (ref 3.5–5.2)
Sodium: 142 mmol/L (ref 134–144)
eGFR: 99 mL/min/{1.73_m2} (ref >59)

## 2021-10-15 LAB — LIPID PANEL
Chol/HDL Ratio: 4.2 ratio (ref 0.0–4.4)
Cholesterol, Total: 175 mg/dL (ref 100–199)
HDL: 42 mg/dL (ref >39)
LDL Chol Calc (NIH): 94 mg/dL (ref 0–99)
Triglycerides: 230 mg/dL — ABNORMAL HIGH (ref 0–149)
VLDL Cholesterol Cal: 39 mg/dL (ref 5–40)

## 2021-10-15 LAB — HEMOGLOBIN A1C
Est. average glucose Bld gHb Est-mCnc: 131 mg/dL
Hgb A1c MFr Bld: 6.2 % — ABNORMAL HIGH (ref 4.8–5.6)

## 2021-10-15 NOTE — Telephone Encounter (Signed)
? ?  Telephone encounter was:  Unsuccessful.  10/15/2021 ?Name: Emily Cochran DOB: Jun 14, 1960 ? ?Unsuccessful outbound call made today to assist with:  Transportation Needs  and Food Insecurity ? ?Outreach Attempt:  2nd Attempt ? ?A HIPAA compliant voice message was left requesting a return call.  Instructed patient to call back at   Instructed patient to call back at [phone]  at their earliest convenience. ?. ? ?Petar Mucci Greenauer -Berneda Rose ?Care Guide , Embedded Care Coordination ?[place], Care Management  ?[phone] ?[address] [place] , [place] [address] ?Email : [email or web address] ?  ? ?

## 2021-10-16 ENCOUNTER — Other Ambulatory Visit: Payer: Self-pay

## 2021-10-16 DIAGNOSIS — E119 Type 2 diabetes mellitus without complications: Secondary | ICD-10-CM

## 2021-10-16 DIAGNOSIS — R946 Abnormal results of thyroid function studies: Secondary | ICD-10-CM

## 2021-10-16 DIAGNOSIS — I10 Essential (primary) hypertension: Secondary | ICD-10-CM

## 2021-10-16 MED ORDER — PRAVASTATIN SODIUM 20 MG PO TABS: ORAL_TABLET | ORAL | 0 refills | Status: AC

## 2021-10-16 MED ORDER — METOPROLOL TARTRATE 25 MG PO TABS: ORAL_TABLET | ORAL | 1 refills | Status: AC

## 2021-10-16 MED ORDER — AMLODIPINE BESYLATE 5 MG PO TABS: 5.0000 mg | ORAL_TABLET | Freq: Every day | ORAL | 0 refills | Status: DC

## 2021-10-16 MED ORDER — OZEMPIC (0.25 OR 0.5 MG/DOSE) 2 MG/1.5ML ~~LOC~~ SOPN: 0.5000 mg | PEN_INJECTOR | SUBCUTANEOUS | 0 refills | Status: DC

## 2021-10-20 ENCOUNTER — Telehealth: Payer: Self-pay | Admitting: *Deleted

## 2021-10-20 NOTE — Telephone Encounter (Signed)
? ?  Telephone encounter was:  Unsuccessful.  10/20/2021 ?Name: Emily Cochran MRN: [id number] DOB: 15-Mar-1961 ? ?Unsuccessful outbound call made today to assist with:  Food Insecurity ? ?Outreach Attempt:  3rd Attempt.  Referral closed unable to contact patient. ? ?A HIPAA compliant voice message was left requesting a return call.  Instructed patient to call back at   Instructed patient to call back at [phone]  at their earliest convenience. . ? ?Zaccheaus Storlie Greenauer -Selinda Eon ?Care Guide , Embedded Care Coordination ?[place], Care Management  ?[phone] ?[address] , [place] [address] ?Email : [email or web address] ?  ? ?

## 2021-10-26 ENCOUNTER — Telehealth: Payer: Self-pay | Admitting: *Deleted

## 2021-10-27 NOTE — Telephone Encounter (Signed)
   Telephone encounter was:  Successful.  10/27/2021 Name: Emily Cochran MRN: [id number] DOB: 06/09/1960  Emily Cochran is a 61 y.o. year old female who is a primary care patient of Arnette Felts, FNP . The community resource team was consulted for assistance with Patient is handling transportation careguide will research other food banks etc senior services in the area of [place] , also looking at [id number] referral options   Care guide performed the following interventions: Patient provided with information about care guide support team and interviewed to confirm resource needs Discussed resources to assist with food  [place] cares 360  .  Follow Up Plan:  Care guide will follow up with patient by phone over the next days  Alois Cliche -[facility] Guide , Embedded Care Coordination [facility], Care Management  [phone] [address] [place] , [place] [address] Email : Yehuda [email or web address]

## 2021-10-28 ENCOUNTER — Telehealth: Payer: Self-pay | Admitting: *Deleted

## 2021-10-28 NOTE — Telephone Encounter (Signed)
   Telephone encounter was:  Unsuccessful.  10/28/2021 Name: Emily Cochran MRN: [id number] DOB: 05-10-1961  Unsuccessful outbound call made today to assist with:  Transportation Needs  and Food InsecurityPateint received email and completed a referal to meals on wheels and [employer] Attempt:  3rd Attempt.  Referral closed unable to contact patient.  A HIPAA compliant voice message was left requesting a return call.  Instructed patient to call back at  Instructed patient to call back at [phone]  at their earliest convenience.   Alois Cliche -[facility] Guide , Embedded Care Coordination [facility], Care Management  [phone] [address] [place] , [place] [address] Email : Yehuda [email or web address]

## 2021-10-30 ENCOUNTER — Telehealth: Payer: Self-pay | Admitting: *Deleted

## 2021-10-30 NOTE — Telephone Encounter (Signed)
   Telephone encounter was:  Successful.  10/30/2021 Name: DANIJELA ALA MRN: [id number] DOB: 11-14-1960  Carmie End Scordato is a 61 y.o. year old female who is a primary care patient of [name], [place] . The community resource team was consulted for assistance with Transportation Needs  and [place] guide performed the following interventions: Patient provided with information about care guide support team and interviewed to confirm resource needs Discussed resources to assist with food and senior resources. Patient referral placed with senior resources .  Follow Up Plan:  No further follow up planned at this time. The patient has been provided with needed resources. [place], Care Management  [phone] [address] , [place] [address] Email : [email or web address] [email or web address]

## 2021-11-05 ENCOUNTER — Other Ambulatory Visit: Payer: Self-pay | Admitting: Nurse Practitioner

## 2021-11-05 DIAGNOSIS — M797 Fibromyalgia: Secondary | ICD-10-CM

## 2021-11-05 MED ORDER — PREGABALIN 75 MG PO CAPS: 75.0000 mg | ORAL_CAPSULE | Freq: Every day | ORAL | 1 refills | Status: DC

## 2022-01-07 ENCOUNTER — Other Ambulatory Visit: Payer: Self-pay

## 2022-01-07 DIAGNOSIS — E119 Type 2 diabetes mellitus without complications: Secondary | ICD-10-CM

## 2022-01-07 MED ORDER — OZEMPIC (0.25 OR 0.5 MG/DOSE) 2 MG/1.5ML ~~LOC~~ SOPN: 0.5000 mg | PEN_INJECTOR | SUBCUTANEOUS | 3 refills | Status: DC

## 2022-01-18 ENCOUNTER — Encounter: Payer: Self-pay | Admitting: Nurse Practitioner

## 2022-01-25 ENCOUNTER — Ambulatory Visit: Payer: Medicaid Other | Admitting: Nurse Practitioner

## 2022-02-13 LAB — HM DIABETES EYE EXAM

## 2022-02-24 ENCOUNTER — Ambulatory Visit: Payer: Medicaid Other | Admitting: Nurse Practitioner

## 2022-02-24 ENCOUNTER — Telehealth: Payer: Self-pay

## 2022-02-24 NOTE — Telephone Encounter (Signed)
Patient has established care at [facility].

## 2022-04-19 ENCOUNTER — Other Ambulatory Visit: Payer: Self-pay

## 2022-04-19 ENCOUNTER — Emergency Department (HOSPITAL_COMMUNITY): Payer: 59

## 2022-04-19 ENCOUNTER — Emergency Department (HOSPITAL_COMMUNITY)
Admission: EM | Admit: 2022-04-19 | Discharge: 2022-04-19 | Discharge status: Against Medical Advice | Payer: 59 | Attending: Emergency Medicine | Admitting: Emergency Medicine

## 2022-04-19 ENCOUNTER — Telehealth: Payer: Self-pay

## 2022-04-19 ENCOUNTER — Encounter (HOSPITAL_COMMUNITY): Payer: Self-pay

## 2022-04-19 DIAGNOSIS — M545 Low back pain, unspecified: Secondary | ICD-10-CM | POA: Diagnosis not present

## 2022-04-19 DIAGNOSIS — Z5321 Procedure and treatment not carried out due to patient leaving prior to being seen by health care provider: Secondary | ICD-10-CM | POA: Insufficient documentation

## 2022-04-19 DIAGNOSIS — M25561 Pain in right knee: Secondary | ICD-10-CM | POA: Insufficient documentation

## 2022-04-19 DIAGNOSIS — M25552 Pain in left hip: Secondary | ICD-10-CM | POA: Diagnosis not present

## 2022-04-19 DIAGNOSIS — M25562 Pain in left knee: Secondary | ICD-10-CM | POA: Diagnosis not present

## 2022-04-19 DIAGNOSIS — M25551 Pain in right hip: Secondary | ICD-10-CM | POA: Insufficient documentation

## 2022-04-19 DIAGNOSIS — Y92480 Sidewalk as the place of occurrence of the external cause: Secondary | ICD-10-CM | POA: Diagnosis not present

## 2022-04-19 DIAGNOSIS — W101XXA Fall (on)(from) sidewalk curb, initial encounter: Secondary | ICD-10-CM | POA: Insufficient documentation

## 2022-04-19 DIAGNOSIS — R109 Unspecified abdominal pain: Secondary | ICD-10-CM | POA: Diagnosis not present

## 2022-04-19 NOTE — ED Triage Notes (Signed)
Patient reports that she tripped on a curb and fell forward 8 days ago. Patient c/o bilateral flank pain, hip,leg, and knee pain.

## 2022-04-19 NOTE — ED Provider Triage Note (Signed)
Emergency Medicine Provider Triage Evaluation Note  Emily Cochran , a 61 y.o. female  was evaluated in triage.  Pt complains of low back pain for the past 2.5 months that was exacerbated after a fall 8 days ago. The patient reports that it is hurting in her bilateral flanks and is radiating down her back and into her knees. She reports she also fell on her knees as well. No other red flag symptoms.  Review of Systems  Positive:  Negative:   Physical Exam  BP (!) 146/80 (BP Location: Right Arm)   Pulse 64   Temp (!) 97.5 F (36.4 C) (Oral)   Resp 16   Ht 5\' 2"  (1.575 m)   Wt 97.5 kg   SpO2 99%   BMI 39.32 kg/m  Gen:   Awake, no distress   Resp:  Normal effort  MSK:   Moves extremities without difficulty  Other:  Diffuse lumbar paraspinal tenderness to palpation. No midline.   Medical Decision Making  Medically screening exam initiated at 9:23 AM.  Appropriate orders placed.  Natarsha JAYEL SCADUTO was informed that the remainder of the evaluation will be completed by another provider, this initial triage assessment does not replace that evaluation, and the importance of remaining in the ED until their evaluation is complete.  CT and XR ordered.   Josefa Half, Achille Rich 04/19/22 [phone]

## 2022-04-19 NOTE — Telephone Encounter (Signed)
Transition Care Management Unsuccessful Follow-up Telephone Call  Date of discharge and from where:  04/19/2022  Attempts:  1st Attempt  Reason for unsuccessful TCM follow-up call:  Left voice message    

## 2022-04-20 ENCOUNTER — Ambulatory Visit (INDEPENDENT_AMBULATORY_CARE_PROVIDER_SITE_OTHER): Payer: 59 | Admitting: Nurse Practitioner

## 2022-04-20 ENCOUNTER — Encounter (HOSPITAL_COMMUNITY): Payer: Self-pay | Admitting: *Deleted

## 2022-04-20 ENCOUNTER — Encounter: Payer: Self-pay | Admitting: Nurse Practitioner

## 2022-04-20 ENCOUNTER — Telehealth: Payer: Self-pay

## 2022-04-20 ENCOUNTER — Ambulatory Visit (HOSPITAL_COMMUNITY)
Admission: EM | Admit: 2022-04-20 | Discharge: 2022-04-20 | Disposition: A | Discharge status: Home / Self Care | Payer: 59 | Attending: Physician Assistant | Admitting: Physician Assistant

## 2022-04-20 VITALS — BP 158/76 | HR 68 | Temp 98.0°F | Ht 62.0 in | Wt 208.0 lb

## 2022-04-20 DIAGNOSIS — M25561 Pain in right knee: Secondary | ICD-10-CM | POA: Diagnosis not present

## 2022-04-20 DIAGNOSIS — M25521 Pain in right elbow: Secondary | ICD-10-CM | POA: Diagnosis not present

## 2022-04-20 DIAGNOSIS — M797 Fibromyalgia: Secondary | ICD-10-CM

## 2022-04-20 DIAGNOSIS — M545 Low back pain, unspecified: Secondary | ICD-10-CM | POA: Diagnosis not present

## 2022-04-20 DIAGNOSIS — W19XXXD Unspecified fall, subsequent encounter: Secondary | ICD-10-CM | POA: Diagnosis not present

## 2022-04-20 DIAGNOSIS — W19XXXA Unspecified fall, initial encounter: Secondary | ICD-10-CM

## 2022-04-20 DIAGNOSIS — E669 Obesity, unspecified: Secondary | ICD-10-CM

## 2022-04-20 DIAGNOSIS — I10 Essential (primary) hypertension: Secondary | ICD-10-CM

## 2022-04-20 DIAGNOSIS — M25562 Pain in left knee: Secondary | ICD-10-CM | POA: Diagnosis not present

## 2022-04-20 DIAGNOSIS — E1169 Type 2 diabetes mellitus with other specified complication: Secondary | ICD-10-CM | POA: Diagnosis not present

## 2022-04-20 DIAGNOSIS — Z6838 Body mass index (BMI) 38.0-38.9, adult: Secondary | ICD-10-CM

## 2022-04-20 DIAGNOSIS — E119 Type 2 diabetes mellitus without complications: Secondary | ICD-10-CM

## 2022-04-20 MED ORDER — TIZANIDINE HCL 4 MG PO TABS: 4.0000 mg | ORAL_TABLET | Freq: Three times a day (TID) | ORAL | 0 refills | Status: AC | PRN

## 2022-04-20 MED ORDER — AMLODIPINE BESYLATE 5 MG PO TABS: 5.0000 mg | ORAL_TABLET | Freq: Every day | ORAL | 0 refills | Status: AC

## 2022-04-20 MED ORDER — OZEMPIC (0.25 OR 0.5 MG/DOSE) 2 MG/1.5ML ~~LOC~~ SOPN: 0.5000 mg | PEN_INJECTOR | SUBCUTANEOUS | 3 refills | Status: AC

## 2022-04-20 MED ORDER — PREDNISONE 20 MG PO TABS: 40.0000 mg | ORAL_TABLET | Freq: Every day | ORAL | 0 refills | Status: AC

## 2022-04-20 MED ORDER — PREGABALIN 75 MG PO CAPS: 75.0000 mg | ORAL_CAPSULE | Freq: Every day | ORAL | 1 refills | Status: AC

## 2022-04-20 NOTE — Progress Notes (Signed)
Hershal Coria Martin,acting as a Neurosurgeon for Arnette Felts, FNP.,have documented all relevant documentation on the behalf of Arnette Felts, FNP,as directed by  Arnette Felts, FNP while in the presence of Arnette Felts, FNP.    Subjective:     Patient ID: Emily Cochran , female    DOB: Apr 10, 1961 , 61 y.o.   MRN: [id number]   Chief Complaint  Patient presents with   Follow-up    HPI  Patient presents today for a ER follow up, patient went to urgent care on 04/20/2022, patient also went to hospital 04/19/2022 and got an xray but left  before treatment. Patient states her sides, legs,and back. She states she fell 2 months ago a home, then fell again about a week ago on her knees in the street. Patient states she needs refills on medications, patient has been giving medications at urgent care this morning.  She was crossing the street 1 week and 2 days ago while crossing the street. Fell face down. When she went to put her foot on the curb she heard a pop in her knee. She had called her yesterday about her visit to ER today.   She made another referral for orthopedics through Paige NP at [facility] where she will continue going for her care.   BP Readings from Last 3 Encounters: 04/20/22 : (!) 158/76 04/20/22 : (!) 156/77 04/19/22 : (!) 146/80   Wt Readings from Last 3 Encounters: 04/20/22 : 208 lb (94.3 kg) 04/19/22 : 215 lb (97.5 kg) 10/14/21 : 216 lb (98 kg)      Past Medical History:  Diagnosis Date   Asthma    Bipolar 1 disorder (HCC)    Hypertension    Kidney stones    Type 2 diabetes mellitus without complication, without long-term current use of insulin (HCC) 02/29/2020     Family History  Problem Relation Age of Onset   Bipolar disorder Mother    Schizophrenia Mother    Diabetes Mother    Cancer Mother    Hypertension Mother    Cancer Father    Diabetes Father      Current Outpatient Medications:    ACCU-CHEK GUIDE test strip, CHECK BLOOD SUGAR UP TO 4 TIMES DAILY,  Disp: , Rfl: 0   ACCU-CHEK SOFTCLIX LANCETS lancets, CHECK BLOOD SUGAR UP TO 4 TIMES DAILY, Disp: 300 each, Rfl: 1   albuterol (VENTOLIN HFA) 108 (90 Base) MCG/ACT inhaler, INHALE 2 PUFFS INTO THE LUNGS EVERY 6 HOURS AS NEEDED FOR WHEEZING OR SHORTNESS OF BREATH, Disp: 54 g, Rfl: 1   Diphenhydramine-APAP, sleep, (GOODY PM PO), Take 1 Package by mouth daily as needed (pain). , Disp: , Rfl:    fluticasone (FLONASE) 50 MCG/ACT nasal spray, Place 2 sprays into both nostrils daily., Disp: 16 g, Rfl: 12   metoprolol tartrate (LOPRESSOR) 25 MG tablet, TAKE 1 TABLET(25 MG) BY MOUTH TWICE DAILY, Disp: 180 tablet, Rfl: 1   pravastatin (PRAVACHOL) 20 MG tablet, TAKE 1 TABLET BY MOUTH MONDAY [facility] AND Friday., Disp: 90 tablet, Rfl: 0   tiZANidine (ZANAFLEX) 4 MG tablet, Take 1 tablet (4 mg total) by mouth every 8 (eight) hours as needed for muscle spasms., Disp: 30 tablet, Rfl: 0   amLODipine (NORVASC) 5 MG tablet, Take 1 tablet (5 mg total) by mouth daily., Disp: 90 tablet, Rfl: 0   pregabalin (LYRICA) 75 MG capsule, Take 1 capsule (75 mg total) by mouth daily., Disp: 90 capsule, Rfl: 1   Semaglutide,0.25 or 0.5MG /DOS, (OZEMPIC,  0.25 OR 0.5 MG/DOSE,) 2 MG/1.5ML SOPN, Inject 0.5 mg into the skin once a week., Disp: 3 mL, Rfl: 3   Allergies  Allergen Reactions   Coconut Flavor Shortness Of Breath and Swelling   Shellfish Allergy Shortness Of Breath and Swelling    She can eat shrimp   Ultram [Tramadol] Anaphylaxis    Pt REPORTS THROAT SWELLING   Aspirin Swelling and Other (See Comments)    Angioedema, cuts on lips     Review of Systems  Constitutional: Negative.   Respiratory: Negative.    Cardiovascular: Negative.   Neurological: Negative.   Hematological: Negative.   Psychiatric/Behavioral: Negative.       Today's Vitals   04/20/22 1545  BP: (!) 158/76  Pulse: 68  Temp: 98 F (36.7 C)  TempSrc: Oral  Weight: 208 lb (94.3 kg)  Height: 5\' 2"  (1.575 m)  PainSc: 10-Worst pain ever    Body mass index is 38.04 kg/m.  Wt Readings from Last 3 Encounters:  04/20/22 208 lb (94.3 kg)  04/19/22 215 lb (97.5 kg)  10/14/21 216 lb (98 kg)    Objective:  Physical Exam Vitals reviewed.  Constitutional:      General: She is not in acute distress.    Appearance: Normal appearance. She is obese.  Cardiovascular:     Rate and Rhythm: Normal rate and regular rhythm.     Pulses: Normal pulses.     Heart sounds: Normal heart sounds. No murmur heard. Pulmonary:     Effort: Pulmonary effort is normal. No respiratory distress.     Breath sounds: Normal breath sounds. No wheezing.  Musculoskeletal:        General: No swelling or tenderness.     Cervical back: Normal range of motion and neck supple.     Comments: Tenderness to lateral area  Skin:    General: Skin is warm and dry.     Capillary Refill: Capillary refill takes less than 2 seconds.  Neurological:     General: No focal deficit present.     Mental Status: She is alert and oriented to person, place, and time.     Cranial Nerves: No cranial nerve deficit.     Motor: No weakness.  Psychiatric:        Mood and Affect: Mood normal.        Behavior: Behavior normal.        Thought Content: Thought content normal.        Judgment: Judgment normal.         Assessment And Plan:     1. Essential hypertension Comments: Blood pressure is slightly elevated today she is in pain. - amLODipine (NORVASC) 5 MG tablet; Take 1 tablet (5 mg total) by mouth daily.  Dispense: 90 tablet; Refill: 0  2. Diabetes mellitus type 2 in obese [facility]) Comments: Continue current medications, tolerating well. - Semaglutide,0.25 or 0.5MG /DOS, (OZEMPIC, 0.25 OR 0.5 MG/DOSE,) 2 MG/1.5ML SOPN; Inject 0.5 mg into the skin once a week.  Dispense: 3 mL; Refill: 3  3. Acute bilateral low back pain without sciatica Comments: Will treat with prednisone and if not better she is to f/u with [facility].  4. Fall, subsequent encounter Comments: She has just  been seen at ER earlier this morning but left before treatment. Reports due to knee giving out. Also discussed fall safety  5. Fibromyalgia Comments: Stable, no changes at this time - pregabalin (LYRICA) 75 MG capsule; Take 1 capsule (75 mg total) by mouth daily.  Dispense: 90 capsule; Refill: 1   I advised her she can not have 2 primary care providers as she has been seen at [facility] due to her relocating to [facility].   Patient was given opportunity to ask questions. Patient verbalized understanding of the plan and was able to repeat key elements of the plan. All questions were answered to their satisfaction.  Arnette Felts, FNP   I, Arnette Felts, FNP, have reviewed all documentation for this visit. The documentation on 04/20/22 for the exam, diagnosis, procedures, and orders are all accurate and complete.   IF YOU HAVE BEEN REFERRED TO A SPECIALIST, IT MAY TAKE 1-2 WEEKS TO SCHEDULE/PROCESS THE REFERRAL. IF YOU HAVE NOT HEARD FROM US/SPECIALIST IN TWO WEEKS, PLEASE GIVE [place] A CALL AT [phone].   THE PATIENT IS ENCOURAGED TO PRACTICE SOCIAL DISTANCING DUE TO THE COVID-19 PANDEMIC.

## 2022-04-20 NOTE — Discharge Instructions (Signed)
Xrays from yesterday do not show any fractures but does show arthritis. I think the prednisone I have prescribed will help with this.   If you are not feeling better in a week or so recommend follow up with orthopedist. Contact information provided for same. They do have walk in clinic as well.

## 2022-04-20 NOTE — ED Provider Notes (Signed)
[facility]    CSN: [id number] Arrival date & time: 04/20/22  0820      History   Chief Complaint Chief Complaint  Patient presents with   Fall    HPI Emily Cochran is a 61 y.o. female.   Patient here today for evaluation of back pain, bilateral knee pain, right elbow pain that started after she had a fall about 2.5 months ago. She reports at that time she fell onto her buttocks and hit her right elbow. She notes that she then fell again about a week ago when she felt as if her knee gave out. She notes she fell directly onto bilateral knees at that time. She continues to have pain and stiffness. She was seen at [facility] ED and had imaging ordered but did not wait for results. She reports some tingling in her right hand at times. She notes that back pain will radiate into her bilateral legs. She notes she has had pain medication but nothing today. She has not had any loss of bowel or bladder function.   The history is provided by the patient.  Fall Pertinent negatives include no abdominal pain and no shortness of breath.    Past Medical History:  Diagnosis Date   Asthma    Bipolar 1 disorder (HCC)    Hypertension    Kidney stones    Type 2 diabetes mellitus without complication, without long-term current use of insulin (HCC) 02/29/2020    Patient Active Problem List   Diagnosis Date Noted   Fibromyalgia 01/01/2021   Type 2 diabetes mellitus without complication, without long-term current use of insulin (HCC) 02/29/2020   Vitamin D deficiency 02/29/2020   Schizophrenia (HCC) 04/28/2018   Essential hypertension 04/28/2018    Past Surgical History:  Procedure Laterality Date   ABDOMINAL HYSTERECTOMY     partial    APPENDECTOMY     CHOLECYSTECTOMY      OB History   No obstetric history on file.      Home Medications    Prior to Admission medications   Medication Sig Start Date End Date Taking? Authorizing Provider  ACCU-CHEK GUIDE test strip  CHECK BLOOD SUGAR UP TO 4 TIMES DAILY 04/20/18  Yes [provider]  ACCU-CHEK SOFTCLIX LANCETS lancets CHECK BLOOD SUGAR UP TO 4 TIMES DAILY 07/03/18  Yes Arnette FeltsMoore, Janece, FNP  albuterol (VENTOLIN HFA) 108 (90 Base) MCG/ACT inhaler INHALE 2 PUFFS INTO THE LUNGS EVERY 6 HOURS AS NEEDED FOR WHEEZING OR SHORTNESS OF BREATH 07/11/19  Yes Arnette FeltsMoore, Janece, FNP  amLODipine (NORVASC) 5 MG tablet Take 1 tablet (5 mg total) by mouth daily. 10/16/21  Yes Arnette FeltsMoore, Janece, FNP  Diphenhydramine-APAP, sleep, (GOODY PM PO) Take 1 Package by mouth daily as needed (pain).    Yes [provider]  fluticasone (FLONASE) 50 MCG/ACT nasal spray Place 2 sprays into both nostrils daily. 08/13/16  Yes Demetrios LollLeaphart, Kenneth T, PA-C  metoprolol tartrate (LOPRESSOR) 25 MG tablet TAKE 1 TABLET(25 MG) BY MOUTH TWICE DAILY 10/16/21  Yes Arnette FeltsMoore, Janece, FNP  pravastatin (PRAVACHOL) 20 MG tablet TAKE 1 TABLET BY MOUTH [facility] Friday. 10/16/21  Yes Arnette FeltsMoore, Janece, FNP  predniSONE (DELTASONE) 20 MG tablet Take 2 tablets (40 mg total) by mouth daily with breakfast for 5 days. 04/20/22 04/25/22 Yes Tomi BambergerMyers, Aveion Nguyen F, PA-C  pregabalin (LYRICA) 75 MG capsule Take 1 capsule (75 mg total) by mouth daily. 11/05/21  Yes Arnette FeltsMoore, Janece, FNP  Semaglutide,0.25 or 0.5MG /DOS, (OZEMPIC, 0.25 OR 0.5  MG/DOSE,) 2 MG/1.5ML SOPN Inject 0.5 mg into the skin once a week. 01/07/22  Yes Arnette Felts, FNP  tiZANidine (ZANAFLEX) 4 MG tablet Take 1 tablet (4 mg total) by mouth every 8 (eight) hours as needed for muscle spasms. 04/20/22  Yes Tomi Bamberger, PA-C    Family History Family History  Problem Relation Age of Onset   Bipolar disorder Mother    Schizophrenia Mother    Diabetes Mother    Cancer Mother    Hypertension Mother    Cancer Father    Diabetes Father     Social History Social History   Tobacco Use   Smoking status: Never   Smokeless tobacco: Never  Vaping Use   Vaping Use: Never used  Substance Use Topics   Alcohol  use: No   Drug use: No     Allergies   Coconut flavor, Shellfish allergy, Ultram [tramadol], and Aspirin   Review of Systems Review of Systems  Constitutional:  Negative for chills and fever.  Eyes:  Negative for discharge and redness.  Respiratory:  Negative for shortness of breath.   Gastrointestinal:  Negative for abdominal pain, nausea and vomiting.  Genitourinary:  Positive for vaginal bleeding and vaginal discharge.  Musculoskeletal:  Positive for arthralgias and back pain.  Skin:  Positive for wound (mild abrasions to right elbow).  Neurological:  Negative for numbness.     Physical Exam Triage Vital Signs ED Triage Vitals  Enc Vitals Group     BP 04/20/22 0932 (!) 156/77     Pulse Rate 04/20/22 0932 60     Resp 04/20/22 0932 18     Temp 04/20/22 0932 97.8 F (36.6 C)     Temp Source 04/20/22 0932 Oral     SpO2 04/20/22 0932 100 %     Weight --      Height --      Head Circumference --      Peak Flow --      Pain Score 04/20/22 0930 10     Pain Loc --      Pain Edu? --      Excl. in GC? --    No data found.  Updated Vital Signs BP (!) 156/77 (BP Location: Right Arm)   Pulse 60   Temp 97.8 F (36.6 C) (Oral)   Resp 18   SpO2 100%      Physical Exam Vitals and nursing note reviewed.  Constitutional:      General: She is not in acute distress.    Appearance: Normal appearance. She is not ill-appearing.  HENT:     Head: Normocephalic and atraumatic.  Eyes:     Conjunctiva/sclera: Conjunctivae normal.  Cardiovascular:     Rate and Rhythm: Normal rate.  Pulmonary:     Effort: Pulmonary effort is normal.  Musculoskeletal:     Comments: Mild TTP noted diffusely to low back bilaterally, midline low back  Skin:    Comments: Healing superficial abrasions to right elbow  Neurological:     Mental Status: She is alert.  Psychiatric:        Mood and Affect: Mood normal.        Behavior: Behavior normal.        Thought Content: Thought content normal.       UC Treatments / Results  Labs (all labs ordered are listed, but only abnormal results are displayed) Labs Reviewed - No data to display  EKG   Radiology CT Lumbar Spine  Wo Contrast  Result Date: 04/19/2022 CLINICAL DATA:  Compression fracture, lumbar. Low back pain with radiculopathy bilaterally. Fall. Leg pain. EXAM: CT LUMBAR SPINE WITHOUT CONTRAST TECHNIQUE: Multidetector CT imaging of the lumbar spine was performed without intravenous contrast administration. Multiplanar CT image reconstructions were also generated. RADIATION DOSE REDUCTION: This exam was performed according to the departmental dose-optimization program which includes automated exposure control, adjustment of the mA and/or kV according to patient size and/or use of iterative reconstruction technique. COMPARISON:  Lumbar spine radiographs 11/06/2020 FINDINGS: Segmentation: 5 lumbar type vertebrae. Alignment: Mild upper lumbar levoscoliosis. No significant listhesis. Vertebrae: No acute fracture or suspicious osseous lesion. Prominent degenerative endplate sclerosis at L4-5. Paraspinal and other soft tissues: Cholecystectomy. Disc levels: T10-11: Ligamentum flavum hypertrophy and calcification and facet spurring result in moderate spinal stenosis and asymmetrically severe medial right neural foraminal stenosis. T11-12: Mild spondylosis and facet arthrosis without evidence of significant stenosis. T12-L1: Mild facet arthrosis without evidence of significant stenosis. L1-2: Mild-to-moderate facet arthrosis without evidence of significant stenosis. L2-3: Right eccentric disc bulging and moderate facet arthrosis result in mild right neural foraminal stenosis without evidence of significant spinal stenosis. L3-4: Mild disc space narrowing with vacuum disc phenomenon. Right eccentric disc bulging and facet and ligamentum flavum hypertrophy result in mild spinal stenosis and mild-to-moderate bilateral neural foraminal stenosis.  L4-5: Severe disc space narrowing with vacuum disc. Left eccentric disc bulging, endplate spurring, and asymmetrically severe left facet arthrosis result in mild spinal stenosis, left greater than right lateral recess stenosis, and severe left neural foraminal stenosis. L5-S1: Negative. IMPRESSION: 1. No acute osseous abnormality. 2. Advanced disc degeneration and left facet arthrosis at L4-5 with mild spinal stenosis and severe left neural foraminal stenosis. 3. Mild spinal stenosis and mild-to-moderate bilateral neural foraminal stenosis at L3-4. 4. Moderate spinal stenosis and advanced right neural foraminal stenosis at T10-11. Electronically Signed   By: Sebastian Ache M.D.   On: 04/19/2022 10:27   DG Knee Complete 4 Views Left  Result Date: 04/19/2022 CLINICAL DATA:  Knee pain after fall EXAM: LEFT KNEE - COMPLETE 4+ VIEW COMPARISON:  08/18/2020 FINDINGS: No acute fracture or malalignment. Moderate tricompartmental osteoarthritis, most pronounced within the medial compartment. Moderate size knee joint effusion. No soft tissue abnormality. IMPRESSION: 1. No acute findings. 2. Moderate tricompartmental osteoarthritis. 3. Moderate size knee joint effusion. Electronically Signed   By: Duanne Guess D.O.   On: 04/19/2022 10:08   DG Hips Bilat W or Wo Pelvis 5 Views  Result Date: 04/19/2022 CLINICAL DATA:  Hip pain after fall EXAM: DG HIP (WITH OR WITHOUT PELVIS) 5+V BILAT COMPARISON:  None Available. FINDINGS: There is no evidence of hip fracture or dislocation. No pelvic diastasis. Mild degenerative changes of both hips. No appreciable soft tissue abnormality. IMPRESSION: Negative. Electronically Signed   By: Duanne Guess D.O.   On: 04/19/2022 10:07   DG Knee Complete 4 Views Right  Result Date: 04/19/2022 CLINICAL DATA:  Fall, knee pain EXAM: RIGHT KNEE - COMPLETE 4+ VIEW COMPARISON:  08/18/2020 FINDINGS: No fracture. No malalignment. Mild-to-moderate tricompartmental osteoarthritis, most  pronounced within the medial compartment. Moderate size knee joint effusion. Soft tissues within normal limits. IMPRESSION: 1. No acute findings. Mild-to-moderate tricompartmental osteoarthritis of the right knee. 2. Moderate size knee joint effusion. Electronically Signed   By: Duanne Guess D.O.   On: 04/19/2022 10:06    Procedures Procedures (including critical care time)  Medications Ordered in UC Medications - No data to display  Initial Impression / Assessment and  Plan / UC Course  I have reviewed the triage vital signs and the nursing notes.  Pertinent labs & imaging results that were available during my care of the patient were reviewed by me and considered in my medical decision making (see chart for details).    Imaging reviewed from yesterday- no fractures- apparent chronic changes with acute exacerbation. Will treat with steroid burst and muscle relaxer. Advised follow up with ortho if no gradual improvement or with any further concerns.   Final Clinical Impressions(s) / UC Diagnoses   Final diagnoses:  Fall, initial encounter  Acute pain of both knees  Acute bilateral low back pain, unspecified whether sciatica present  Right elbow pain     Discharge Instructions      Xrays from yesterday do not show any fractures but does show arthritis. I think the prednisone I have prescribed will help with this.   If you are not feeling better in a week or so recommend follow up with orthopedist. Contact information provided for same. They do have walk in clinic as well.      ED Prescriptions     Medication Sig Dispense Auth. Provider   predniSONE (DELTASONE) 20 MG tablet Take 2 tablets (40 mg total) by mouth daily with breakfast for 5 days. 10 tablet Erma Pinto F, PA-C   tiZANidine (ZANAFLEX) 4 MG tablet Take 1 tablet (4 mg total) by mouth every 8 (eight) hours as needed for muscle spasms. 30 tablet Tomi Bamberger, PA-C      PDMP not reviewed this encounter.    Tomi Bamberger, PA-C 04/20/22 1046

## 2022-04-20 NOTE — Telephone Encounter (Signed)
Transition Care Management Follow-up Telephone Call Date of discharge and from where: 04/19/2022 [place] [facility]  How have you been since you were released from the hospital? Pt states she does not feel any better.  Any questions or concerns? No  Items Reviewed: Did the pt receive and understand the discharge instructions provided? Yes  Medications obtained and verified? Yes  Other? No  Any new allergies since your discharge? No  Dietary orders reviewed? Yes Do you have support at home? Yes   Home Care and Equipment/Supplies: Were home health services ordered? no If so, what is the name of the agency? N/a  Has the agency set up a time to come to the patient's home? no Were any new equipment or medical supplies ordered?  No What is the name of the medical supply agency? N/a Were you able to get the supplies/equipment? no Do you have any questions related to the use of the equipment or supplies? No  Functional Questionnaire: (I = Independent and D = Dependent) ADLs: i  Bathing/Dressing- i  Meal Prep- i  Eating- i  Maintaining continence- i  Transferring/Ambulation- i  Managing Meds- i  Follow up appointments reviewed:  PCP Hospital f/u appt confirmed? Yes  Scheduled to see Arnette Felts  on 04/20/2022  @ [facility]. Specialist Hospital f/u appt confirmed? No  Scheduled to see n/a on n/a @ n/a. Are transportation arrangements needed? No  If their condition worsens, is the pt aware to call PCP or go to the Emergency Dept.? Yes Was the patient provided with contact information for the PCP's office or ED? Yes Was to pt encouraged to call back with questions or concerns? Yes

## 2022-04-20 NOTE — ED Triage Notes (Signed)
Pt states she fell 2.5 months ago at her house and help her right elbow. The she fell on the street trying to step up on a curb she felt like her knee gave out 1 week ago. She is having elbow pain on the right, back pain radiating to her hips and down her legs. She is nto taking anything for the pain. She can't get up alone if she sits too long.

## 2022-04-20 NOTE — Patient Instructions (Signed)

## 2022-04-28 ENCOUNTER — Encounter (HOSPITAL_COMMUNITY): Payer: Self-pay | Admitting: Emergency Medicine

## 2022-04-28 ENCOUNTER — Ambulatory Visit (HOSPITAL_COMMUNITY)
Admission: EM | Admit: 2022-04-28 | Discharge: 2022-04-28 | Disposition: A | Discharge status: Home / Self Care | Payer: 59 | Attending: Nurse Practitioner | Admitting: Nurse Practitioner

## 2022-04-28 DIAGNOSIS — M5442 Lumbago with sciatica, left side: Secondary | ICD-10-CM | POA: Diagnosis not present

## 2022-04-28 DIAGNOSIS — M174 Other bilateral secondary osteoarthritis of knee: Secondary | ICD-10-CM | POA: Diagnosis not present

## 2022-04-28 DIAGNOSIS — M5441 Lumbago with sciatica, right side: Secondary | ICD-10-CM

## 2022-04-28 DIAGNOSIS — M5451 Vertebrogenic low back pain: Secondary | ICD-10-CM | POA: Diagnosis not present

## 2022-04-28 MED ORDER — KETOROLAC TROMETHAMINE 30 MG/ML IJ SOLN: 30.0000 mg | Freq: Once | INTRAMUSCULAR | 0 refills | Status: AC

## 2022-04-28 MED ORDER — PREDNISONE 20 MG PO TABS: ORAL_TABLET | ORAL | 0 refills | Status: AC

## 2022-04-28 NOTE — ED Provider Notes (Signed)
[facility]    CSN: [id number] Arrival date & time: 04/28/22  1211      History   Chief Complaint Chief Complaint  Patient presents with   Leg Pain    HPI Emily Cochran is a 61 y.o. female.   HPI  She is in today for knee pain. She is SP fall. She has had Spine CT, hip and knee pain. Spine CT DDD and left facte athrosis at L4-5 with spinal stenosis. She has Tricompartmental OA of both knee with joint effusion. She is complaining of  bilateral leg pain that is shooting up into her back.  Past Medical History:  Diagnosis Date   Asthma    Bipolar 1 disorder (HCC)    Hypertension    Kidney stones    Type 2 diabetes mellitus without complication, without long-term current use of insulin (HCC) 02/29/2020    Patient Active Problem List   Diagnosis Date Noted   Fibromyalgia 01/01/2021   Type 2 diabetes mellitus without complication, without long-term current use of insulin (HCC) 02/29/2020   Vitamin D deficiency 02/29/2020   Schizophrenia (HCC) 04/28/2018   Essential hypertension 04/28/2018    Past Surgical History:  Procedure Laterality Date   ABDOMINAL HYSTERECTOMY     partial    APPENDECTOMY     CHOLECYSTECTOMY      OB History   No obstetric history on file.      Home Medications    Prior to Admission medications   Medication Sig Start Date End Date Taking? Authorizing Provider  ketorolac (TORADOL) 30 MG/ML injection Inject 1 mL (30 mg total) into the vein once for 1 dose. 04/28/22 04/28/22 Yes Shaman Muscarella, Shana Chute, NP  predniSONE (DELTASONE) 20 MG tablet Take 3 tablets (60 mg total) by mouth daily with breakfast for 3 days, THEN 2 tablets (40 mg total) daily with breakfast for 3 days, THEN 1 tablet (20 mg total) daily with breakfast for 3 days. 04/28/22 05/07/22 Yes Barbette Merino, NP  ACCU-CHEK GUIDE test strip CHECK BLOOD SUGAR UP TO 4 TIMES DAILY 04/20/18   [provider]  ACCU-CHEK SOFTCLIX LANCETS lancets CHECK BLOOD SUGAR UP TO 4 TIMES  DAILY 07/03/18   Arnette Felts, FNP  albuterol (VENTOLIN HFA) 108 (90 Base) MCG/ACT inhaler INHALE 2 PUFFS INTO THE LUNGS EVERY 6 HOURS AS NEEDED FOR WHEEZING OR SHORTNESS OF BREATH 07/11/19   Arnette Felts, FNP  amLODipine (NORVASC) 5 MG tablet Take 1 tablet (5 mg total) by mouth daily. 04/20/22   Arnette Felts, FNP  Diphenhydramine-APAP, sleep, (GOODY PM PO) Take 1 Package by mouth daily as needed (pain).     [provider]  fluticasone (FLONASE) 50 MCG/ACT nasal spray Place 2 sprays into both nostrils daily. 08/13/16   Demetrios Loll T, PA-C  metoprolol tartrate (LOPRESSOR) 25 MG tablet TAKE 1 TABLET(25 MG) BY MOUTH TWICE DAILY 10/16/21   Arnette Felts, FNP  pravastatin (PRAVACHOL) 20 MG tablet TAKE 1 TABLET BY MOUTH MONDAY [facility] AND Friday. 10/16/21   Arnette Felts, FNP  pregabalin (LYRICA) 75 MG capsule Take 1 capsule (75 mg total) by mouth daily. 04/20/22   Arnette Felts, FNP  Semaglutide,0.25 or 0.5MG /DOS, (OZEMPIC, 0.25 OR 0.5 MG/DOSE,) 2 MG/1.5ML SOPN Inject 0.5 mg into the skin once a week. 04/20/22   Arnette Felts, FNP  tiZANidine (ZANAFLEX) 4 MG tablet Take 1 tablet (4 mg total) by mouth every 8 (eight) hours as needed for muscle spasms. 04/20/22   Tomi Bamberger, PA-C  Family History Family History  Problem Relation Age of Onset   Bipolar disorder Mother    Schizophrenia Mother    Diabetes Mother    Cancer Mother    Hypertension Mother    Cancer Father    Diabetes Father     Social History Social History   Tobacco Use   Smoking status: Never   Smokeless tobacco: Never  Vaping Use   Vaping Use: Never used  Substance Use Topics   Alcohol use: No   Drug use: No     Allergies   Coconut flavor, Shellfish allergy, Ultram [tramadol], and Aspirin   Review of Systems Review of Systems   Physical Exam Triage Vital Signs ED Triage Vitals  Enc Vitals Group     BP 04/28/22 1241 94/68     Pulse Rate 04/28/22 1241 71     Resp 04/28/22 1241 17     Temp  04/28/22 1241 97.9 F (36.6 C)     Temp Source 04/28/22 1241 Oral     SpO2 04/28/22 1239 98 %     Weight --      Height --      Head Circumference --      Peak Flow --      Pain Score 04/28/22 1239 10     Pain Loc --      Pain Edu? --      Excl. in GC? --    No data found.  Updated Vital Signs BP 94/68 (BP Location: Left Arm)   Pulse 71   Temp 97.9 F (36.6 C) (Oral)   Resp 17   SpO2 97%   Visual Acuity Right Eye Distance:   Left Eye Distance:   Bilateral Distance:    Right Eye Near:   Left Eye Near:    Bilateral Near:     Physical Exam Constitutional:      Appearance: She is obese.  HENT:     Head: Normocephalic and atraumatic.     Nose: Nose normal.     Mouth/Throat:     Mouth: Mucous membranes are dry.  Eyes:     Pupils: Pupils are equal, round, and reactive to light.  Cardiovascular:     Rate and Rhythm: Normal rate.  Pulmonary:     Effort: Pulmonary effort is normal.  Abdominal:     Comments: Increased abdominal girth   Musculoskeletal:     Lumbar back: Tenderness present. Negative left straight leg raise test.     Right hip: Tenderness present. Decreased range of motion.     Left hip: Tenderness present. Decreased range of motion.     Right knee: Decreased range of motion.     Right lower leg: No edema.     Left lower leg: No edema.  Skin:    General: Skin is warm and dry.     Capillary Refill: Capillary refill takes less than 2 seconds.  Neurological:     General: No focal deficit present.     Mental Status: She is alert and oriented to person, place, and time.  Psychiatric:        Mood and Affect: Mood normal.      UC Treatments / Results  Labs (all labs ordered are listed, but only abnormal results are displayed) Labs Reviewed - No data to display  EKG   Radiology No results found.  Procedures Procedures (including critical care time)  Medications Ordered in UC Medications - No data to display  Initial Impression /  Assessment and Plan / UC Course  I have reviewed the triage vital signs and the nursing notes.  Pertinent labs & imaging results that were available during my care of the patient were reviewed by me and considered in my medical decision making (see chart for details).     Leg pain Final Clinical Impressions(s) / UC Diagnoses   Final diagnoses:  Other secondary osteoarthritis of both knees  Acute bilateral low back pain with bilateral sciatica     Discharge Instructions      You have Osteoarthritis in several areas, lumbar spine and knees. You have been given Toradol 30 mg injection today for the acute pain per your request. I have prescribed a Prednisone 20 mg Dosepak over the next 9 days to help with the pain.  Please see attached instructions for additional treatment options     ED Prescriptions     Medication Sig Dispense Auth. Provider   ketorolac (TORADOL) 30 MG/ML injection Inject 1 mL (30 mg total) into the vein once for 1 dose. 1 mL Barbette Merino, NP   predniSONE (DELTASONE) 20 MG tablet Take 3 tablets (60 mg total) by mouth daily with breakfast for 3 days, THEN 2 tablets (40 mg total) daily with breakfast for 3 days, THEN 1 tablet (20 mg total) daily with breakfast for 3 days. 18 tablet Barbette Merino, NP      PDMP not reviewed this encounter.   Thad Ranger [place], NP 04/28/22 1356

## 2022-04-28 NOTE — Discharge Instructions (Addendum)
You have Osteoarthritis in several areas, lumbar spine and knees. You have been given Toradol 30 mg injection today for the acute pain per your request. I have prescribed a Prednisone 20 mg Dosepak over the next 9 days to help with the pain.  Please see attached instructions for additional treatment options

## 2022-04-28 NOTE — ED Triage Notes (Signed)
Pt reports pain in bilateral lower extremities x 1 day States the pain started in her right knee and now has bilateral leg pain. States pain also shoots up her back. Was taken prednisone for pain.

## 2022-10-27 ENCOUNTER — Ambulatory Visit: Payer: Medicaid Other | Admitting: Nurse Practitioner
